# Patient Record
Sex: Male | Born: 1940 | Race: Black or African American | Hispanic: No | Marital: Married | Smoking: Never smoker
Health system: Southern US, Community
[De-identification: ages and names within clinical notes are randomized; demographics above are authoritative.]

## PROBLEM LIST (undated history)

## (undated) DIAGNOSIS — E119 Type 2 diabetes mellitus without complications: Secondary | ICD-10-CM

## (undated) DIAGNOSIS — R232 Flushing: Secondary | ICD-10-CM

## (undated) DIAGNOSIS — Z972 Presence of dental prosthetic device (complete) (partial): Secondary | ICD-10-CM

## (undated) DIAGNOSIS — H409 Unspecified glaucoma: Secondary | ICD-10-CM

## (undated) DIAGNOSIS — R351 Nocturia: Secondary | ICD-10-CM

## (undated) DIAGNOSIS — D869 Sarcoidosis, unspecified: Secondary | ICD-10-CM

## (undated) DIAGNOSIS — C61 Malignant neoplasm of prostate: Secondary | ICD-10-CM

## (undated) DIAGNOSIS — K5730 Diverticulosis of large intestine without perforation or abscess without bleeding: Secondary | ICD-10-CM

## (undated) DIAGNOSIS — E785 Hyperlipidemia, unspecified: Secondary | ICD-10-CM

## (undated) DIAGNOSIS — D649 Anemia, unspecified: Secondary | ICD-10-CM

## (undated) DIAGNOSIS — C642 Malignant neoplasm of left kidney, except renal pelvis: Secondary | ICD-10-CM

## (undated) DIAGNOSIS — Z86010 Personal history of colonic polyps: Secondary | ICD-10-CM

## (undated) DIAGNOSIS — Z8673 Personal history of transient ischemic attack (TIA), and cerebral infarction without residual deficits: Secondary | ICD-10-CM

## (undated) DIAGNOSIS — H269 Unspecified cataract: Secondary | ICD-10-CM

## (undated) DIAGNOSIS — R350 Frequency of micturition: Secondary | ICD-10-CM

## (undated) DIAGNOSIS — R011 Cardiac murmur, unspecified: Secondary | ICD-10-CM

## (undated) HISTORY — PX: MOLE REMOVAL: SHX2046

## (undated) HISTORY — DX: Sarcoidosis, unspecified: D86.9

## (undated) HISTORY — PX: PROSTATE SURGERY: SHX751

## (undated) HISTORY — DX: Unspecified cataract: H26.9

## (undated) HISTORY — PX: OTHER SURGICAL HISTORY: SHX169

---

## 2009-06-30 ENCOUNTER — Encounter (INDEPENDENT_AMBULATORY_CARE_PROVIDER_SITE_OTHER): Payer: Self-pay | Admitting: General Surgery

## 2009-06-30 ENCOUNTER — Emergency Department (HOSPITAL_COMMUNITY): Admission: EM | Admit: 2009-06-30 | Discharge: 2009-06-30 | Payer: Self-pay | Admitting: Emergency Medicine

## 2011-04-03 LAB — DIFFERENTIAL
Basophils Absolute: 0.2 10*3/uL — ABNORMAL HIGH (ref 0.0–0.1)
Basophils Relative: 2 % — ABNORMAL HIGH (ref 0–1)
Eosinophils Absolute: 0.2 10*3/uL (ref 0.0–0.7)
Eosinophils Relative: 3 % (ref 0–5)
Lymphs Abs: 2.7 10*3/uL (ref 0.7–4.0)
Neutrophils Relative %: 34 % — ABNORMAL LOW (ref 43–77)

## 2011-04-03 LAB — CBC
HCT: 42 % (ref 39.0–52.0)
MCV: 87.6 fL (ref 78.0–100.0)
Platelets: 196 10*3/uL (ref 150–400)
RDW: 14.8 % (ref 11.5–15.5)
WBC: 6.2 10*3/uL (ref 4.0–10.5)

## 2011-04-03 LAB — BASIC METABOLIC PANEL
BUN: 14 mg/dL (ref 6–23)
Chloride: 103 mEq/L (ref 96–112)
Creatinine, Ser: 0.98 mg/dL (ref 0.4–1.5)
GFR calc non Af Amer: >60 mL/min (ref >60)
Glucose, Bld: 222 mg/dL — ABNORMAL HIGH (ref 70–99)
Potassium: 3.9 mEq/L (ref 3.5–5.1)

## 2011-05-10 NOTE — Op Note (Signed)
NAMEKANAI, BERRIOS NO.:  [id number]   MEDICAL RECORD NO.:  [id number]          PATIENT TYPE:  EMS   LOCATION:  ED                            FACILITY:  [facility]   PHYSICIAN:  Barbaraann Barthel, M.D. DATE OF BIRTH:  Sep 17, 1941   DATE OF PROCEDURE:  DATE OF DISCHARGE:  06/30/2009                               OPERATIVE REPORT   NOTE:  Surgery was asked to see this middle-aged black male in my  office, who came in with a bleeding, a lesion on his back.  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  He states that he leaned back at work and caused this  to bleeding.  He came to the emergency room and from there the patient  was referred to my office.   TREATMENT RENDERED:  He was prepped and draped in the usual manner.  The  excision was carried out around this lesion and this was removed in  total and sent in formalin for pathology.  The wound was then closed  primarily with 2-0 nylon sutures.  Prior to closure, all sponge, needle  and instruments were correct.  I do not think we need any antibiotics on  this as there is no infection.  I will follow him up in my office for  suture removal in 2 weeks.      Barbaraann Barthel, M.D.  Electronically Signed     WB/MEDQ  D:  06/30/2009  T:  07/01/2009  Job:  [id number]   cc:   Shelda Jakes, MD  [address]  [place], [place] [address]

## 2013-04-09 ENCOUNTER — Telehealth: Payer: Self-pay

## 2013-04-09 NOTE — Telephone Encounter (Signed)
Pt called and wants to get colonoscopy. Never had one. He was referred by family and wants Dr. Jena Gauss to do it. Scheduled OV with Tana Coast PA at 9:00 Am on 04/29/2013 because he has seen a little blood in stool.

## 2013-04-29 ENCOUNTER — Ambulatory Visit (INDEPENDENT_AMBULATORY_CARE_PROVIDER_SITE_OTHER): Payer: Managed Care, Other (non HMO) | Admitting: Gastroenterology

## 2013-04-29 ENCOUNTER — Encounter: Payer: Self-pay | Admitting: Gastroenterology

## 2013-04-29 ENCOUNTER — Other Ambulatory Visit: Payer: Self-pay | Admitting: Internal Medicine

## 2013-04-29 VITALS — BP 160/88 | HR 77 | Temp 98.6°F | Ht 67.0 in | Wt 205.6 lb

## 2013-04-29 DIAGNOSIS — K625 Hemorrhage of anus and rectum: Secondary | ICD-10-CM

## 2013-04-29 MED ORDER — PEG 3350-KCL-NA BICARB-NACL 420 G PO SOLR: 4000.0000 mL | ORAL | Status: DC

## 2013-04-29 NOTE — Assessment & Plan Note (Signed)
72 year old gentleman with history of recent episode of bright red blood per thumb. Possibly benign anorectal source. Other than vague right-sided abdominal pain related to movement, he has been without any GI symptoms. Abdominal exam is benign. Suspect musculoskeletal component for the right-sided abdominal pain. Proceed with colonoscopy in the near future.  I have discussed the risks, alternatives, benefits with regards to but not limited to the risk of reaction to medication, bleeding, infection, perforation and the patient is agreeable to proceed. Written consent to be obtained.

## 2013-04-29 NOTE — Patient Instructions (Signed)
1. Colonoscopy with Dr. Jena Gauss. Please see separate instructions. 2. Encourage you to establish care with a general practitioner. Please followup with your appointment with Dr. Juanetta Gosling as scheduled.

## 2013-04-29 NOTE — Progress Notes (Signed)
Primary Care Physician:  Fredirick Maudlin, MD  Primary Gastroenterologist:  Roetta Sessions, MD   Chief Complaint  Patient presents with  . Colonoscopy    HPI:  Jon Brooks is a 72 y.o. male here to schedule his first ever colonoscopy. Single episode of brbpr recently. No constipation, diarrhea. Mild RLQ pain started couple of days ago. Aggravated with movement. Not affected by meals or BMs. Goes away without doing anything. No heartburn. No dysphagia. No weight loss. No family history of colon cancer. Daughter died of liver disease age 12, possibly Jon Brooks.  Current Outpatient Prescriptions  Medication Sig Dispense Refill  . AZOPT 1 % ophthalmic suspension Place 1 drop into both eyes 2 (two) times daily.       . TRAVATAN Z 0.004 % SOLN ophthalmic solution Place 1 drop into both eyes at bedtime.        No current facility-administered medications for this visit.    Allergies as of 04/29/2013  . (No Known Allergies)    Past Medical History  Diagnosis Date  . Glaucoma     Past Surgical History  Procedure Laterality Date  . Cyst on left shoulder      Family History  Problem Relation Age of Onset  . Colon cancer Neg Hx   . Liver disease Father     NASH? age 47    History   Social History  . Marital Status: Married    Spouse Name: N/A    Number of Children: 1  . Years of Education: N/A   Occupational History  .  [employer]   Social History Main Topics  . Smoking status: Never Smoker   . Smokeless tobacco: Not on file  . Alcohol Use: No  . Drug Use: No  . Sexually Active: Not on file   Other Topics Concern  . Not on file   Social History Narrative   Daughter deceased age 20, liver disease ?NASH 2010      ROS:  General: Negative for anorexia, weight loss, fever, chills, fatigue, weakness. Eyes: Negative for vision changes.  ENT: Negative for hoarseness, difficulty swallowing , nasal congestion. CV: Negative for chest pain, angina, palpitations,  dyspnea on exertion, peripheral edema.  Respiratory: Negative for dyspnea at rest, dyspnea on exertion, cough, sputum, wheezing.  GI: See history of present illness. GU:  Negative for dysuria, hematuria, urinary incontinence, urinary frequency, nocturnal urination.  MS: Negative for joint pain, low back pain.  Derm: Negative for rash or itching.  Neuro: Negative for weakness, abnormal sensation, seizure, frequent headaches, memory loss, confusion.  Psych: Negative for anxiety, depression, suicidal ideation, hallucinations.  Endo: Negative for unusual weight change.  Heme: Negative for bruising or bleeding. Allergy: Negative for rash or hives.    Physical Examination:  BP 160/88  Pulse 77  Temp(Src) 98.6 F (37 C) (Oral)  Ht 5\' 7"  (1.702 m)  Wt 205 lb 9.6 oz (93.26 kg)  BMI 32.19 kg/m2   General: Well-nourished, well-developed in no acute distress.  Head: Normocephalic, atraumatic.   Eyes: Conjunctiva pink, no icterus. Mouth: Oropharyngeal mucosa moist and pink , no lesions erythema or exudate. Neck: Supple without thyromegaly, masses, or lymphadenopathy.  Lungs: Clear to auscultation bilaterally.  Heart: Regular rate and rhythm, no murmurs rubs or gallops.  Abdomen: Bowel sounds are normal, nontender, nondistended, no hepatosplenomegaly or masses, no abdominal bruits or    hernia , no rebound or guarding.   Rectal: Deferred Extremities: No lower extremity edema. No clubbing or deformities.  Neuro: Alert and oriented x 4 , grossly normal neurologically.  Skin: Warm and dry, no rash or jaundice.   Psych: Alert and cooperative, normal mood and affect.

## 2013-04-29 NOTE — Progress Notes (Signed)
CC PCP 

## 2013-04-30 ENCOUNTER — Encounter (HOSPITAL_COMMUNITY): Payer: Self-pay | Admitting: Pharmacy Technician

## 2013-05-15 ENCOUNTER — Telehealth: Payer: Self-pay

## 2013-05-15 NOTE — Telephone Encounter (Signed)
Pt called and was concerned that he was supposed to take the Ducolax tablets and Bisacodyl tablets and I told him it was the same. Told him to take 2 tablets at noon and 2 one hour after completing his solution. He is also aware that he is to take the enema before he goes to the hospital.

## 2013-05-16 ENCOUNTER — Ambulatory Visit (HOSPITAL_COMMUNITY)
Admission: RE | Admit: 2013-05-16 | Discharge: 2013-05-16 | Disposition: A | Discharge status: Home / Self Care | Payer: Managed Care, Other (non HMO) | Source: Ambulatory Visit | Attending: Internal Medicine | Admitting: Internal Medicine

## 2013-05-16 ENCOUNTER — Encounter (HOSPITAL_COMMUNITY)
Admission: RE | Disposition: A | Discharge status: Home / Self Care | Payer: Self-pay | Source: Ambulatory Visit | Attending: Internal Medicine

## 2013-05-16 ENCOUNTER — Encounter (HOSPITAL_COMMUNITY): Payer: Self-pay

## 2013-05-16 DIAGNOSIS — D126 Benign neoplasm of colon, unspecified: Secondary | ICD-10-CM | POA: Insufficient documentation

## 2013-05-16 DIAGNOSIS — H409 Unspecified glaucoma: Secondary | ICD-10-CM | POA: Insufficient documentation

## 2013-05-16 DIAGNOSIS — Z86010 Personal history of colonic polyps: Secondary | ICD-10-CM

## 2013-05-16 DIAGNOSIS — Q438 Other specified congenital malformations of intestine: Secondary | ICD-10-CM | POA: Insufficient documentation

## 2013-05-16 DIAGNOSIS — R1031 Right lower quadrant pain: Secondary | ICD-10-CM | POA: Insufficient documentation

## 2013-05-16 DIAGNOSIS — Z79899 Other long term (current) drug therapy: Secondary | ICD-10-CM | POA: Insufficient documentation

## 2013-05-16 DIAGNOSIS — Z860101 Personal history of adenomatous and serrated colon polyps: Secondary | ICD-10-CM

## 2013-05-16 DIAGNOSIS — K5730 Diverticulosis of large intestine without perforation or abscess without bleeding: Secondary | ICD-10-CM | POA: Insufficient documentation

## 2013-05-16 DIAGNOSIS — K625 Hemorrhage of anus and rectum: Secondary | ICD-10-CM | POA: Insufficient documentation

## 2013-05-16 HISTORY — PX: COLONOSCOPY: SHX5424

## 2013-05-16 HISTORY — DX: Personal history of adenomatous and serrated colon polyps: Z86.0101

## 2013-05-16 HISTORY — DX: Personal history of colonic polyps: Z86.010

## 2013-05-16 SURGERY — COLONOSCOPY
Anesthesia: Moderate Sedation

## 2013-05-16 MED ORDER — MIDAZOLAM HCL 5 MG/5ML IJ SOLN
INTRAMUSCULAR | Status: AC
  Filled 2013-05-16: qty 10

## 2013-05-16 MED ORDER — MEPERIDINE HCL 100 MG/ML IJ SOLN
INTRAMUSCULAR | Status: DC | PRN
  Administered 2013-05-16: 25 mg via INTRAVENOUS
  Administered 2013-05-16: 50 mg via INTRAVENOUS
  Administered 2013-05-16: 25 mg via INTRAVENOUS

## 2013-05-16 MED ORDER — SODIUM CHLORIDE 0.9 % IV SOLN
INTRAVENOUS | Status: DC
  Administered 2013-05-16: 20 mL/h via INTRAVENOUS

## 2013-05-16 MED ORDER — STERILE WATER FOR IRRIGATION IR SOLN
Status: DC | PRN
  Administered 2013-05-16: 12:00:00

## 2013-05-16 MED ORDER — ONDANSETRON HCL 4 MG/2ML IJ SOLN
INTRAMUSCULAR | Status: DC | PRN
  Administered 2013-05-16: 4 mg via INTRAVENOUS

## 2013-05-16 MED ORDER — ONDANSETRON HCL 4 MG/2ML IJ SOLN
INTRAMUSCULAR | Status: AC
  Filled 2013-05-16: qty 2

## 2013-05-16 MED ORDER — MEPERIDINE HCL 100 MG/ML IJ SOLN
INTRAMUSCULAR | Status: AC
  Filled 2013-05-16: qty 1

## 2013-05-16 MED ORDER — MIDAZOLAM HCL 5 MG/5ML IJ SOLN
INTRAMUSCULAR | Status: DC | PRN
  Administered 2013-05-16 (×2): 1 mg via INTRAVENOUS
  Administered 2013-05-16: 2 mg via INTRAVENOUS
  Administered 2013-05-16 (×2): 1 mg via INTRAVENOUS

## 2013-05-16 NOTE — Op Note (Signed)
[facility] [address] [place], [address]   COLONOSCOPY PROCEDURE REPORT  PATIENT: Jon Brooks, Jon Brooks  MR#:         [id number] BIRTHDATE: [birth year] , 72  yrs. old GENDER: Male ENDOSCOPIST: R.  Roetta Sessions, MD FACP FACG REFERRED BY:  Kari Baars, M.D. PROCEDURE DATE:  05/16/2013 PROCEDURE:     Colonoscopy with snare polypectomy  INDICATIONS: single episode of hematochezia; no prior colonoscopy  INFORMED CONSENT:  The risks, benefits, alternatives and imponderables including but not limited to bleeding, perforation as well as the possibility of a missed lesion have been reviewed.  The potential for biopsy, lesion removal, etc. have also been discussed.  Questions have been answered.  All parties agreeable. Please see the history and physical in the medical record for more information.  MEDICATIONS: Versed 6 mg IV and Demerol 100 mg IV in divided doses. Zofran 4 mg IV  DESCRIPTION OF PROCEDURE:  After a digital rectal exam was performed, the EC-[id number] ([id number])  colonoscope was advanced from the anus through the rectum and colon to the area of the cecum, ileocecal valve and appendiceal orifice.  The cecum was deeply intubated.  These structures were well-seen and photographed for the record.  From the level of the cecum and ileocecal valve, the scope was slowly and cautiously withdrawn.  The mucosal surfaces were carefully surveyed utilizing scope tip deflection to facilitate fold flattening as needed.  The scope was pulled down into the rectum where a thorough examination including retroflexion was performed.    FINDINGS:  Adequate preparation. Normal rectum. Redundant colon. Left-sided diverticula. (1) 3 mm polyp opposite the ileocecal valve; the remainder of the colonic mucosa appeared normal.  THERAPEUTIC / DIAGNOSTIC MANEUVERS PERFORMED:  The above-mentioned polyps was cold snare removed  COMPLICATIONS: none  CECAL WITHDRAWAL TIME:  6  minutes  IMPRESSION:  colonic diverticulosis. Colonic polyp-removed as described above  RECOMMENDATIONS: Followup on pathology   _______________________________ eSigned:  R. Roetta Sessions, MD FACP [facility] 05/16/2013 1:12 PM   CC:

## 2013-05-16 NOTE — H&P (View-Only) (Signed)
Primary Care Physician:  HAWKINS,EDWARD L, MD  Primary Gastroenterologist:  Michael Rourk, MD   Chief Complaint  Patient presents with  . Colonoscopy    HPI:  Jon Brooks is a 72 y.o. male here to schedule his first ever colonoscopy. Single episode of brbpr recently. No constipation, diarrhea. Mild RLQ pain started couple of days ago. Aggravated with movement. Not affected by meals or BMs. Goes away without doing anything. No heartburn. No dysphagia. No weight loss. No family history of colon cancer. Daughter died of liver disease age 29, possibly [place].  Current Outpatient Prescriptions  Medication Sig Dispense Refill  . AZOPT 1 % ophthalmic suspension Place 1 drop into both eyes 2 (two) times daily.       . TRAVATAN Z 0.004 % SOLN ophthalmic solution Place 1 drop into both eyes at bedtime.        No current facility-administered medications for this visit.    Allergies as of 04/29/2013  . (No Known Allergies)    Past Medical History  Diagnosis Date  . Glaucoma     Past Surgical History  Procedure Laterality Date  . Cyst on left shoulder      Family History  Problem Relation Age of Onset  . Colon cancer Neg Hx   . Liver disease Father     NASH? age 29    History   Social History  . Marital Status: Married    Spouse Name: N/A    Number of Children: 1  . Years of Education: N/A   Occupational History  .  [employer]   Social History Main Topics  . Smoking status: Never Smoker   . Smokeless tobacco: Not on file  . Alcohol Use: No  . Drug Use: No  . Sexually Active: Not on file   Other Topics Concern  . Not on file   Social History Narrative   Daughter deceased age 29, liver disease ?NASH 2010      ROS:  General: Negative for anorexia, weight loss, fever, chills, fatigue, weakness. Eyes: Negative for vision changes.  ENT: Negative for hoarseness, difficulty swallowing , nasal congestion. CV: Negative for chest pain, angina, palpitations,  dyspnea on exertion, peripheral edema.  Respiratory: Negative for dyspnea at rest, dyspnea on exertion, cough, sputum, wheezing.  GI: See history of present illness. GU:  Negative for dysuria, hematuria, urinary incontinence, urinary frequency, nocturnal urination.  MS: Negative for joint pain, low back pain.  Derm: Negative for rash or itching.  Neuro: Negative for weakness, abnormal sensation, seizure, frequent headaches, memory loss, confusion.  Psych: Negative for anxiety, depression, suicidal ideation, hallucinations.  Endo: Negative for unusual weight change.  Heme: Negative for bruising or bleeding. Allergy: Negative for rash or hives.    Physical Examination:  BP 160/88  Pulse 77  Temp(Src) 98.6 F (37 C) (Oral)  Ht 5' 7" (1.702 m)  Wt 205 lb 9.6 oz (93.26 kg)  BMI 32.19 kg/m2   General: Well-nourished, well-developed in no acute distress.  Head: Normocephalic, atraumatic.   Eyes: Conjunctiva pink, no icterus. Mouth: Oropharyngeal mucosa moist and pink , no lesions erythema or exudate. Neck: Supple without thyromegaly, masses, or lymphadenopathy.  Lungs: Clear to auscultation bilaterally.  Heart: Regular rate and rhythm, no murmurs rubs or gallops.  Abdomen: Bowel sounds are normal, nontender, nondistended, no hepatosplenomegaly or masses, no abdominal bruits or    hernia , no rebound or guarding.   Rectal: Deferred Extremities: No lower extremity edema. No clubbing or deformities.    Neuro: Alert and oriented x 4 , grossly normal neurologically.  Skin: Warm and dry, no rash or jaundice.   Psych: Alert and cooperative, normal mood and affect.     

## 2013-05-16 NOTE — Interval H&P Note (Signed)
History and Physical Interval Note:  05/16/2013 12:07 PM  Jon Brooks  has presented today for surgery, with the diagnosis of RECTAL BLEEDING  The various methods of treatment have been discussed with the patient and family. After consideration of risks, benefits and other options for treatment, the patient has consented to  Procedure(s) with comments: COLONOSCOPY (N/A) - 9:30 as a surgical intervention .  The patient's history has been reviewed, patient examined, no change in status, stable for surgery.  I have reviewed the patient's chart and labs.  Questions were answered to the patient's satisfaction.     Colonoscopy per plan. First-ever examination. Recent hematochezia.The risks, benefits, limitations, alternatives and imponderables have been reviewed with the patient. Questions have been answered. All parties are agreeable.   Jon Brooks

## 2013-05-21 ENCOUNTER — Encounter (HOSPITAL_COMMUNITY): Payer: Self-pay | Admitting: Internal Medicine

## 2013-05-21 ENCOUNTER — Encounter: Payer: Self-pay | Admitting: Internal Medicine

## 2013-10-19 NOTE — Progress Notes (Signed)
REVIEWED.  

## 2015-06-19 ENCOUNTER — Encounter (HOSPITAL_COMMUNITY): Payer: Self-pay | Admitting: Emergency Medicine

## 2015-06-19 ENCOUNTER — Observation Stay (HOSPITAL_COMMUNITY)
Admission: EM | Admit: 2015-06-19 | Discharge: 2015-06-21 | Disposition: A | Discharge status: Home / Self Care | Payer: Managed Care, Other (non HMO) | Attending: Pulmonary Disease | Admitting: Pulmonary Disease

## 2015-06-19 ENCOUNTER — Emergency Department (HOSPITAL_COMMUNITY): Payer: Managed Care, Other (non HMO)

## 2015-06-19 DIAGNOSIS — R4182 Altered mental status, unspecified: Secondary | ICD-10-CM | POA: Diagnosis not present

## 2015-06-19 DIAGNOSIS — Z79899 Other long term (current) drug therapy: Secondary | ICD-10-CM | POA: Diagnosis not present

## 2015-06-19 DIAGNOSIS — IMO0002 Reserved for concepts with insufficient information to code with codable children: Secondary | ICD-10-CM | POA: Diagnosis present

## 2015-06-19 DIAGNOSIS — I639 Cerebral infarction, unspecified: Principal | ICD-10-CM | POA: Insufficient documentation

## 2015-06-19 DIAGNOSIS — R29810 Facial weakness: Secondary | ICD-10-CM | POA: Diagnosis present

## 2015-06-19 DIAGNOSIS — E1165 Type 2 diabetes mellitus with hyperglycemia: Secondary | ICD-10-CM | POA: Diagnosis present

## 2015-06-19 DIAGNOSIS — Z8673 Personal history of transient ischemic attack (TIA), and cerebral infarction without residual deficits: Secondary | ICD-10-CM

## 2015-06-19 DIAGNOSIS — G458 Other transient cerebral ischemic attacks and related syndromes: Secondary | ICD-10-CM

## 2015-06-19 DIAGNOSIS — R739 Hyperglycemia, unspecified: Secondary | ICD-10-CM

## 2015-06-19 DIAGNOSIS — H409 Unspecified glaucoma: Secondary | ICD-10-CM | POA: Diagnosis not present

## 2015-06-19 DIAGNOSIS — G459 Transient cerebral ischemic attack, unspecified: Secondary | ICD-10-CM | POA: Diagnosis present

## 2015-06-19 DIAGNOSIS — J0100 Acute maxillary sinusitis, unspecified: Secondary | ICD-10-CM

## 2015-06-19 DIAGNOSIS — E1169 Type 2 diabetes mellitus with other specified complication: Secondary | ICD-10-CM | POA: Diagnosis present

## 2015-06-19 DIAGNOSIS — I10 Essential (primary) hypertension: Secondary | ICD-10-CM | POA: Diagnosis present

## 2015-06-19 DIAGNOSIS — J019 Acute sinusitis, unspecified: Secondary | ICD-10-CM | POA: Diagnosis present

## 2015-06-19 DIAGNOSIS — E785 Hyperlipidemia, unspecified: Secondary | ICD-10-CM

## 2015-06-19 HISTORY — DX: Personal history of transient ischemic attack (TIA), and cerebral infarction without residual deficits: Z86.73

## 2015-06-19 LAB — CBC WITH DIFFERENTIAL/PLATELET
Basophils Absolute: 0 10*3/uL (ref 0.0–0.1)
Basophils Relative: 0 % (ref 0–1)
Eosinophils Absolute: 0.2 10*3/uL (ref 0.0–0.7)
Eosinophils Relative: 3 % (ref 0–5)
HCT: 44 % (ref 39.0–52.0)
Hemoglobin: 14.6 g/dL (ref 13.0–17.0)
LYMPHS PCT: 35 % (ref 12–46)
Lymphs Abs: 2 10*3/uL (ref 0.7–4.0)
MCH: 28.6 pg (ref 26.0–34.0)
MCHC: 33.2 g/dL (ref 30.0–36.0)
MCV: 86.1 fL (ref 78.0–100.0)
Monocytes Absolute: 0.8 10*3/uL (ref 0.1–1.0)
Monocytes Relative: 14 % — ABNORMAL HIGH (ref 3–12)
NEUTROS ABS: 2.7 10*3/uL (ref 1.7–7.7)
NEUTROS PCT: 48 % (ref 43–77)
PLATELETS: 218 10*3/uL (ref 150–400)
RBC: 5.11 MIL/uL (ref 4.22–5.81)
RDW: 14.5 % (ref 11.5–15.5)
WBC: 5.7 10*3/uL (ref 4.0–10.5)

## 2015-06-19 LAB — COMPREHENSIVE METABOLIC PANEL
ALT: 20 U/L (ref 17–63)
AST: 29 U/L (ref 15–41)
Albumin: 3.5 g/dL (ref 3.5–5.0)
Alkaline Phosphatase: 77 U/L (ref 38–126)
Anion gap: 10 (ref 5–15)
BUN: 17 mg/dL (ref 6–20)
CALCIUM: 9 mg/dL (ref 8.9–10.3)
CO2: 25 mmol/L (ref 22–32)
CREATININE: 1.04 mg/dL (ref 0.61–1.24)
Chloride: 103 mmol/L (ref 101–111)
GFR calc Af Amer: >60 mL/min (ref >60)
GFR calc non Af Amer: >60 mL/min (ref >60)
GLUCOSE: 257 mg/dL — AB (ref 65–99)
Potassium: 4.1 mmol/L (ref 3.5–5.1)
Sodium: 138 mmol/L (ref 135–145)
Total Bilirubin: 0.9 mg/dL (ref 0.3–1.2)
Total Protein: 8.2 g/dL — ABNORMAL HIGH (ref 6.5–8.1)

## 2015-06-19 LAB — URINALYSIS, ROUTINE W REFLEX MICROSCOPIC
BILIRUBIN URINE: NEGATIVE
Glucose, UA: 100 mg/dL — AB
Ketones, ur: NEGATIVE mg/dL
NITRITE: POSITIVE — AB
PH: 5.5 (ref 5.0–8.0)
Protein, ur: NEGATIVE mg/dL
Specific Gravity, Urine: 1.025 (ref 1.005–1.030)
Urobilinogen, UA: 0.2 mg/dL (ref 0.0–1.0)

## 2015-06-19 LAB — I-STAT CHEM 8, ED
BUN: 17 mg/dL (ref 6–20)
CALCIUM ION: 1.18 mmol/L (ref 1.13–1.30)
CREATININE: 1 mg/dL (ref 0.61–1.24)
Chloride: 103 mmol/L (ref 101–111)
GLUCOSE: 258 mg/dL — AB (ref 65–99)
HCT: 49 % (ref 39.0–52.0)
Hemoglobin: 16.7 g/dL (ref 13.0–17.0)
POTASSIUM: 4.3 mmol/L (ref 3.5–5.1)
Sodium: 139 mmol/L (ref 135–145)
TCO2: 23 mmol/L (ref 0–100)

## 2015-06-19 LAB — GLUCOSE, CAPILLARY: Glucose-Capillary: 231 mg/dL — ABNORMAL HIGH (ref 65–99)

## 2015-06-19 LAB — RAPID URINE DRUG SCREEN, HOSP PERFORMED
Amphetamines: NOT DETECTED
Barbiturates: NOT DETECTED
Benzodiazepines: NOT DETECTED
COCAINE: NOT DETECTED
OPIATES: NOT DETECTED
TETRAHYDROCANNABINOL: NOT DETECTED

## 2015-06-19 LAB — TROPONIN I

## 2015-06-19 LAB — URINE MICROSCOPIC-ADD ON

## 2015-06-19 LAB — I-STAT TROPONIN, ED: Troponin i, poc: 0 ng/mL (ref 0.00–0.08)

## 2015-06-19 LAB — APTT: APTT: 34 s (ref 24–37)

## 2015-06-19 LAB — ETHANOL: ALCOHOL ETHYL (B): 6 mg/dL — AB (ref <5)

## 2015-06-19 MED ORDER — STROKE: EARLY STAGES OF RECOVERY BOOK
Freq: Once | Status: DC
  Filled 2015-06-19: qty 1

## 2015-06-19 MED ORDER — ASPIRIN 325 MG PO TABS
325.0000 mg | ORAL_TABLET | Freq: Every day | ORAL | Status: DC
  Administered 2015-06-20 – 2015-06-21 (×2): 325 mg via ORAL
  Filled 2015-06-19 (×2): qty 1

## 2015-06-19 MED ORDER — CEFUROXIME AXETIL 250 MG PO TABS
500.0000 mg | ORAL_TABLET | Freq: Two times a day (BID) | ORAL | Status: DC
  Administered 2015-06-19 – 2015-06-21 (×4): 500 mg via ORAL
  Filled 2015-06-19 (×4): qty 2

## 2015-06-19 MED ORDER — ENOXAPARIN SODIUM 40 MG/0.4ML ~~LOC~~ SOLN
40.0000 mg | SUBCUTANEOUS | Status: DC
Start: 2015-06-19 — End: 2015-06-21
  Administered 2015-06-19 – 2015-06-20 (×2): 40 mg via SUBCUTANEOUS
  Filled 2015-06-19 (×2): qty 0.4

## 2015-06-19 MED ORDER — LATANOPROST 0.005 % OP SOLN
1.0000 [drp] | Freq: Every day | OPHTHALMIC | Status: DC
  Administered 2015-06-19 – 2015-06-20 (×2): 1 [drp] via OPHTHALMIC
  Filled 2015-06-19: qty 2.5

## 2015-06-19 MED ORDER — INSULIN ASPART 100 UNIT/ML ~~LOC~~ SOLN
0.0000 [IU] | Freq: Three times a day (TID) | SUBCUTANEOUS | Status: DC
  Administered 2015-06-20 (×2): 2 [IU] via SUBCUTANEOUS
  Administered 2015-06-20: 3 [IU] via SUBCUTANEOUS
  Administered 2015-06-21: 2 [IU] via SUBCUTANEOUS

## 2015-06-19 MED ORDER — BRINZOLAMIDE 1 % OP SUSP
1.0000 [drp] | Freq: Two times a day (BID) | OPHTHALMIC | Status: DC
  Administered 2015-06-20 – 2015-06-21 (×3): 1 [drp] via OPHTHALMIC
  Filled 2015-06-19: qty 10

## 2015-06-19 MED ORDER — INSULIN ASPART 100 UNIT/ML ~~LOC~~ SOLN
0.0000 [IU] | Freq: Every day | SUBCUTANEOUS | Status: DC
  Administered 2015-06-19: 2 [IU] via SUBCUTANEOUS

## 2015-06-19 MED ORDER — LATANOPROST 0.005 % OP SOLN
OPHTHALMIC | Status: AC
  Filled 2015-06-19: qty 2.5

## 2015-06-19 NOTE — ED Notes (Signed)
Report given to [facility], Dept 300, all questions answered.

## 2015-06-19 NOTE — H&P (Signed)
[facility] History and Physical  Liron Eissler KLK:[id number] DOB: 11-Mar-1941    PCP:   Alonza Bogus, MD   Chief Complaint: slurred speech and transient confusion.  HPI: Jon Brooks is an 74 y.o. male with benign PMH, having glaucoma on eye drops, presented to the ER as a code stroke.  He was having dinner with his wife, and "fell asleep" with food in his mouth.  She thought his face was droopy as well.  She was trying to talk to him, but he was having confusions.  There was no slurred speech.  He said he often felt sleepy after eating, but never actually felt asleep.  Code stroke was called, and by the time he returned to his room after head CT, all his symptoms resolved.  He was not a candidate for TPA due to resolution of his mild symptoms.  Further work up included an EKG with NSR, and BS of 257, Cr 1.04, and K of 4.1.  His WBC is normal, and his Hb was 14. 6.  Head CT showed right maxillary sinusitis but no CVA.  Hospitalist was asked to admit him for TIA and hyperglycemia.   Rewiew of Systems:  Constitutional: Negative for malaise, fever and chills. No significant weight loss or weight gain Eyes: Negative for eye pain, redness and discharge, diplopia, visual changes, or flashes of light. ENMT: Negative for ear pain, hoarseness, nasal congestion, sinus pressure and sore throat. No headaches; tinnitus, drooling, or problem swallowing. Cardiovascular: Negative for chest pain, palpitations, diaphoresis, dyspnea and peripheral edema. ; No orthopnea, PND Respiratory: Negative for cough, hemoptysis, wheezing and stridor. No pleuritic chestpain. Gastrointestinal: Negative for nausea, vomiting, diarrhea, constipation, abdominal pain, melena, blood in stool, hematemesis, jaundice and rectal bleeding.    Genitourinary: Negative for frequency, dysuria, incontinence,flank pain and hematuria; Musculoskeletal: Negative for back pain and neck pain. Negative for swelling and trauma.;  Skin: .  Negative for pruritus, rash, abrasions, bruising and skin lesion.; ulcerations Neuro: Negative for headache, lightheadedness and neck stiffness. Negative for weakness, altered level of consciousness , altered mental status, extremity weakness, burning feet, involuntary movement, seizure and syncope.  Psych: negative for anxiety, depression, insomnia, tearfulness, panic attacks, hallucinations, paranoia, suicidal or homicidal ideation    Past Medical History  Diagnosis Date  . Glaucoma     Past Surgical History  Procedure Laterality Date  . Cyst on left shoulder    . Colonoscopy N/A 05/16/2013    Procedure: COLONOSCOPY;  Surgeon: Daneil Dolin, MD;  Location: [facility];  Service: Endoscopy;  Laterality: N/A;  9:30    Medications:  HOME MEDS: Prior to Admission medications   Medication Sig Start Date End Date Taking? Authorizing Provider  AZOPT 1 % ophthalmic suspension Place 1 drop into both eyes 2 (two) times daily.  03/15/13  Yes Historical Provider, MD  TRAVATAN Z 0.004 % SOLN ophthalmic solution Place 1 drop into both eyes at bedtime.  04/17/13  Yes Historical Provider, MD  polyethylene glycol-electrolytes (TRILYTE) 420 G solution Take 4,000 mLs by mouth as directed. Patient not taking: Reported on 06/19/2015 04/29/13   Daneil Dolin, MD     Allergies:  No Known Allergies  Social History:   reports that he has never smoked. He does not have any smokeless tobacco history on file. He reports that he does not drink alcohol or use illicit drugs.  Family History: Family History  Problem Relation Age of Onset  . Colon cancer Neg Hx   . Liver disease  Father     NASH? age 21     Physical Exam: Filed Vitals:   06/19/15 1737 06/19/15 1802 06/19/15 1813  BP: 164/89  152/92  Pulse:   69  Temp:  98.3 F (36.8 C)   Resp: 18  17  SpO2:   100%   Blood pressure 152/92, pulse 69, temperature 98.3 F (36.8 C), resp. rate 17, SpO2 100 %.  GEN:  Pleasant patient lying in the  stretcher in no acute distress; cooperative with exam. PSYCH:  alert and oriented x4; does not appear anxious or depressed; affect is appropriate. HEENT: Mucous membranes pink and anicteric; PERRLA; EOM intact; no cervical lymphadenopathy nor thyromegaly or carotid bruit; no JVD; There were no stridor. Neck is very supple. Breasts:: Not examined CHEST WALL: No tenderness CHEST: Normal respiration, clear to auscultation bilaterally.  HEART: Regular rate and rhythm.  There are no murmur, rub, or gallops.   BACK: No kyphosis or scoliosis; no CVA tenderness ABDOMEN: soft and non-tender; no masses, no organomegaly, normal abdominal bowel sounds; no pannus; no intertriginous candida. There is no rebound and no distention. Rectal Exam: Not done EXTREMITIES: No bone or joint deformity; age-appropriate arthropathy of the hands and knees; no edema; no ulcerations.  There is no calf tenderness. Genitalia: not examined PULSES: 2+ and symmetric SKIN: Normal hydration no rash or ulceration CNS: Cranial nerves 2-12 grossly intact no focal lateralizing neurologic deficit.  Speech is fluent; uvula elevated with phonation, facial symmetry and tongue midline. DTR are normal bilaterally, cerebella exam is intact, barbinski is negative and strengths are equaled bilaterally.  No sensory loss.   Labs on Admission:  Basic Metabolic Panel:  Recent Labs Lab 06/19/15 1734 06/19/15 1739  NA 138 139  K 4.1 4.3  CL 103 103  CO2 25  --   GLUCOSE 257* 258*  BUN 17 17  CREATININE 1.04 1.00  CALCIUM 9.0  --    Liver Function Tests:  Recent Labs Lab 06/19/15 1734  AST 29  ALT 20  ALKPHOS 77  BILITOT 0.9  PROT 8.2*  ALBUMIN 3.5   No results for input(s): LIPASE, AMYLASE in the last 168 hours. No results for input(s): AMMONIA in the last 168 hours. CBC:  Recent Labs Lab 06/19/15 1734 06/19/15 1739  WBC 5.7  --   NEUTROABS 2.7  --   HGB 14.6 16.7  HCT 44.0 49.0  MCV 86.1  --   PLT 218  --     Cardiac Enzymes:  Recent Labs Lab 06/19/15 1734  TROPONINI <0.03    CBG: No results for input(s): GLUCAP in the last 168 hours.   Radiological Exams on Admission: Ct Head Wo Contrast  06/19/2015   CLINICAL DATA:  Left-sided weakness and slurred speech starting 1 hour ago.  EXAM: CT HEAD WITHOUT CONTRAST  TECHNIQUE: Contiguous axial images were obtained from the base of the skull through the vertex without intravenous contrast.  COMPARISON:  None.  FINDINGS: The brainstem, cerebellum, cerebral peduncles, thalamus, basal ganglia, basilar cisterns, and ventricular system appear within normal limits. No intracranial hemorrhage, mass lesion, or acute CVA.  There is newly complete opacification of the visualized portion of the right maxillary sinus with opacification of multiple right-sided ethmoid air cells and complete opacification of the right frontal sinus.  IMPRESSION: 1. Complete opacification of the right frontal sinus with opacification of multiple right-sided ethmoid air cells and subtotal opacification the right maxillary sinus. This may reflect acute or chronic sinusitis. No obvious cortical breakthrough is  identified. 2. No acute CVA or other acute intracranial findings are observed. These results were called by telephone at the time of interpretation on 06/19/2015 at 5:43 pm to Dr. Milton Ferguson , who verbally acknowledged these results.   Electronically Signed   By: Van Clines M.D.   On: 06/19/2015 17:43    EKG: Independently reviewed.    Assessment/Plan Present on Admission:  . Brain TIA Hyperglycemia. Sinusitis.   PLAN:  Will admit him for TIA.  Will start ASA after swallowing test.  Work up will include MRI/MRA brain, ECHO, and carotids US.  For his hyperglycemia, will use SSI, and consider metformin upon discharge from the hospital.  For his sinusitis, will start him on Ceftin BID for 10 days.  He is stable, full code, and will be admitted to [facility].  He would like to  have Dr Luan Pulling as his PCP, but had never seen him.  Would have him follow up with Dr Luan Pulling upon discharge.  Thanks.   Other plans as per orders.  Code Status: FULL Haskel Khan, MD. [facility] Pager [phone] 7pm to 7am.  06/19/2015, 8:42 PM

## 2015-06-19 NOTE — ED Notes (Signed)
Per EMS pt eating dinner with family and had syncopal episode, left sided weakness and left side facial droop. EMS reports 2 additional syncopal episodes in route. EDP at [place] to assess pt. Pt straight to CT scan.

## 2015-06-19 NOTE — ED Notes (Signed)
CT read negative per teleneurologist. Patient states he feels as if he is back to his normal.

## 2015-06-19 NOTE — Progress Notes (Signed)
Randell Loop called @ 17:14 Beeper @ 17:16 [facility] Radiology @ 17:32 -  Had to wait for someone to answer.  Images to [facility] immediately after scanning - 17:25 /bbj for [employer]

## 2015-06-19 NOTE — ED Provider Notes (Signed)
CSN: [id number]     Arrival date & time 06/19/15  1723 History   First MD Initiated Contact with Patient 06/19/15 1724     Chief Complaint  Patient presents with  . Code Stroke     (Consider location/radiation/quality/duration/timing/severity/associated sxs/prior Treatment) Patient is a 74 y.o. male presenting with weakness. The history is provided by the spouse (pt was eating and had some left facial droop and confusion.  ems stated he had left side weakness.  started at 4pm).  Weakness This is a new problem. The current episode started 1 to 2 hours ago. The problem occurs constantly. The problem has not changed since onset.Pertinent negatives include no chest pain and no abdominal pain. Nothing aggravates the symptoms. Nothing relieves the symptoms.    Past Medical History  Diagnosis Date  . Glaucoma    Past Surgical History  Procedure Laterality Date  . Cyst on left shoulder    . Colonoscopy N/A 05/16/2013    Procedure: COLONOSCOPY;  Surgeon: Daneil Dolin, MD;  Location: [facility];  Service: Endoscopy;  Laterality: N/A;  9:30   Family History  Problem Relation Age of Onset  . Colon cancer Neg Hx   . Liver disease Father     NASH? age 29   History  Substance Use Topics  . Smoking status: Never Smoker   . Smokeless tobacco: Not on file  . Alcohol Use: No    Review of Systems  Unable to perform ROS: Mental status change  Cardiovascular: Negative for chest pain.  Gastrointestinal: Negative for abdominal pain.      Allergies  Review of patient's allergies indicates no known allergies.  Home Medications   Prior to Admission medications   Medication Sig Start Date End Date Taking? Authorizing Provider  AZOPT 1 % ophthalmic suspension Place 1 drop into both eyes 2 (two) times daily.  03/15/13   Historical Provider, MD  polyethylene glycol-electrolytes (TRILYTE) 420 G solution Take 4,000 mLs by mouth as directed. 04/29/13   Daneil Dolin, MD  TRAVATAN Z 0.004 %  SOLN ophthalmic solution Place 1 drop into both eyes at bedtime.  04/17/13   Historical Provider, MD   BP 152/92 mmHg  Pulse 69  Temp(Src) 98.3 F (36.8 C)  Resp 17  SpO2 100% Physical Exam  Constitutional: He is oriented to person, place, and time. He appears well-developed.  HENT:  Head: Normocephalic.  Left facial droop  Eyes: Conjunctivae and EOM are normal. No scleral icterus.  Neck: Neck supple. No thyromegaly present.  Cardiovascular: Normal rate and regular rhythm.  Exam reveals no gallop and no friction rub.   No murmur heard. Pulmonary/Chest: No stridor. He has no wheezes. He has no rales. He exhibits no tenderness.  Abdominal: He exhibits no distension. There is no tenderness. There is no rebound.  Musculoskeletal: Normal range of motion. He exhibits no edema.  Severe left arm weakness.  Lymphadenopathy:    He has no cervical adenopathy.  Neurological: He is oriented to person, place, and time. He exhibits normal muscle tone. Coordination normal.  Skin: No rash noted. No erythema.  Psychiatric: He has a normal mood and affect. His behavior is normal.    ED Course  Procedures (including critical care time) Labs Review Labs Reviewed  URINALYSIS, ROUTINE W REFLEX MICROSCOPIC (NOT AT [facility]) - Abnormal; Notable for the following:    Glucose, UA 100 (*)    Hgb urine dipstick TRACE (*)    Nitrite POSITIVE (*)    Leukocytes,  UA MODERATE (*)    All other components within normal limits  CBC WITH DIFFERENTIAL/PLATELET - Abnormal; Notable for the following:    Monocytes Relative 14 (*)    All other components within normal limits  COMPREHENSIVE METABOLIC PANEL - Abnormal; Notable for the following:    Glucose, Bld 257 (*)    Total Protein 8.2 (*)    All other components within normal limits  ETHANOL - Abnormal; Notable for the following:    Alcohol, Ethyl (B) 6 (*)    All other components within normal limits  URINE MICROSCOPIC-ADD ON - Abnormal; Notable for the  following:    Bacteria, UA MANY (*)    All other components within normal limits  I-STAT CHEM 8, ED - Abnormal; Notable for the following:    Glucose, Bld 258 (*)    All other components within normal limits  URINE RAPID DRUG SCREEN, HOSP PERFORMED  APTT  TROPONIN I  I-STAT TROPOININ, ED  I-STAT CHEM 8, ED    Imaging Review Ct Head Wo Contrast  06/19/2015   CLINICAL DATA:  Left-sided weakness and slurred speech starting 1 hour ago.  EXAM: CT HEAD WITHOUT CONTRAST  TECHNIQUE: Contiguous axial images were obtained from the base of the skull through the vertex without intravenous contrast.  COMPARISON:  None.  FINDINGS: The brainstem, cerebellum, cerebral peduncles, thalamus, basal ganglia, basilar cisterns, and ventricular system appear within normal limits. No intracranial hemorrhage, mass lesion, or acute CVA.  There is newly complete opacification of the visualized portion of the right maxillary sinus with opacification of multiple right-sided ethmoid air cells and complete opacification of the right frontal sinus.  IMPRESSION: 1. Complete opacification of the right frontal sinus with opacification of multiple right-sided ethmoid air cells and subtotal opacification the right maxillary sinus. This may reflect acute or chronic sinusitis. No obvious cortical breakthrough is identified. 2. No acute CVA or other acute intracranial findings are observed. These results were called by telephone at the time of interpretation on 06/19/2015 at 5:43 pm to Dr. Milton Ferguson , who verbally acknowledged these results.   Electronically Signed   By: Van Clines M.D.   On: 06/19/2015 17:43     EKG Interpretation   Date/Time:  Friday June 19 2015 17:38:42 EDT Ventricular Rate:  72 PR Interval:  190 QRS Duration: 75 QT Interval:  407 QTC Calculation: 445 R Axis:   55 Text Interpretation:  Sinus rhythm Nonspecific T abnormalities, lateral  leads Confirmed by Alejandra Hunt  MD, Christabella Alvira [phone]) on 06/19/2015  7:39:22 PM     CRITICAL CARE Performed by: Anthem Frazer L Total critical care time:40 Critical care time was exclusive of separately billable procedures and treating other patients. Critical care was necessary to treat or prevent imminent or life-threatening deterioration. Critical care was time spent personally by me on the following activities: development of treatment plan with patient and/or surrogate as well as nursing, discussions with consultants, evaluation of patient's response to treatment, examination of patient, obtaining history from patient or surrogate, ordering and performing treatments and interventions, ordering and review of laboratory studies, ordering and review of radiographic studies, pulse oximetry and re-evaluation of patient's condition.   MDM   Final diagnoses:  Acute anterior circulation TIA    When pt returned from ct his symptoms had resolved.  Pt seen by neurology and he is to be admitted for tia work up    Milton Ferguson, MD 06/19/15 1940

## 2015-06-19 NOTE — ED Notes (Signed)
Patient arrived to ED at 1720 via [facility], was examined upon arrival by Dr. Roderic Palau and found to have left upper ext weakness and mild left sided droop when smiling. Patient to CT at 1722 accompanied by EMS providers. Back to ED room 10 at 1732, and Teleneuro began at 1735

## 2015-06-20 ENCOUNTER — Observation Stay (HOSPITAL_COMMUNITY): Payer: Managed Care, Other (non HMO)

## 2015-06-20 ENCOUNTER — Observation Stay (HOSPITAL_BASED_OUTPATIENT_CLINIC_OR_DEPARTMENT_OTHER): Payer: Managed Care, Other (non HMO)

## 2015-06-20 DIAGNOSIS — G459 Transient cerebral ischemic attack, unspecified: Secondary | ICD-10-CM

## 2015-06-20 DIAGNOSIS — I639 Cerebral infarction, unspecified: Secondary | ICD-10-CM | POA: Diagnosis not present

## 2015-06-20 HISTORY — PX: TRANSTHORACIC ECHOCARDIOGRAM: SHX275

## 2015-06-20 LAB — GLUCOSE, CAPILLARY
GLUCOSE-CAPILLARY: 225 mg/dL — AB (ref 65–99)
GLUCOSE-CAPILLARY: 181 mg/dL — AB (ref 65–99)
Glucose-Capillary: 170 mg/dL — ABNORMAL HIGH (ref 65–99)
Glucose-Capillary: 205 mg/dL — ABNORMAL HIGH (ref 65–99)

## 2015-06-20 LAB — LIPID PANEL
Cholesterol: 251 mg/dL — ABNORMAL HIGH (ref 0–200)
HDL: 31 mg/dL — ABNORMAL LOW (ref >40)
LDL Cholesterol: 175 mg/dL — ABNORMAL HIGH (ref 0–99)
Total CHOL/HDL Ratio: 8.1 RATIO
Triglycerides: 225 mg/dL — ABNORMAL HIGH (ref <150)
VLDL: 45 mg/dL — AB (ref 0–40)

## 2015-06-20 MED ORDER — ATORVASTATIN CALCIUM 40 MG PO TABS
80.0000 mg | ORAL_TABLET | Freq: Every day | ORAL | Status: DC
  Administered 2015-06-20: 80 mg via ORAL
  Filled 2015-06-20: qty 2

## 2015-06-20 NOTE — Progress Notes (Signed)
  Echocardiogram 2D Echocardiogram has been performed.  Lysle Rubens 06/20/2015, 3:56 PM

## 2015-06-20 NOTE — Progress Notes (Signed)
Patient and family wanted to know if patient would be discharged today. All of patients test were complete. MD on call was notified. Awaiting response at this time. Will continue to monitor patient at this time.

## 2015-06-20 NOTE — Progress Notes (Signed)
Provided diabetes handouts and education to patient at the bedside. I allowed patient to ask questions and also used the teach back method with patient. Patient currently has glucometer machine at home and is aware of how to check his blood sugars twice a day. Patient has no questions or concerns at this time. Will continue to educate patient at this time.

## 2015-06-20 NOTE — Progress Notes (Signed)
Subjective: He was admitted yesterday with TIA. He says he feels okay now. He was also discovered to have very high lipids and to have elevated blood sugar. His workup is underway. He says he feels okay now  Objective: Vital signs in last 24 hours: Temp:  [97.7 F (36.5 C)-98.3 F (36.8 C)] 97.7 F (36.5 C) (06/25 0800) Pulse Rate:  [48-69] 48 (06/25 0800) Resp:  [15-18] 17 (06/25 0800) BP: (152-166)/(78-92) 154/78 mmHg (06/25 0800) SpO2:  [94 %-100 %] 100 % (06/25 0800) Weight:  [87.862 kg (193 lb 11.2 oz)] 87.862 kg (193 lb 11.2 oz) (06/24 2324) Weight change:  Last BM Date: 06/18/15  Intake/Output from previous day: 06/24 0701 - 06/25 0700 In: -  Out: 200 [Urine:200]  PHYSICAL EXAM General appearance: alert, cooperative and no distress Resp: clear to auscultation bilaterally Cardio: regular rate and rhythm, S1, S2 normal, no murmur, click, rub or gallop GI: soft, non-tender; bowel sounds normal; no masses,  no organomegaly Extremities: extremities normal, atraumatic, no cyanosis or edema  Lab Results:  Results for orders placed or performed during the hospital encounter of 06/19/15 (from the past 48 hour(s))  CBC with Differential     Status: Abnormal   Collection Time: 06/19/15  5:34 PM  Result Value Ref Range   WBC 5.7 4.0 - 10.5 K/uL   RBC 5.11 4.22 - 5.81 MIL/uL   Hemoglobin 14.6 13.0 - 17.0 g/dL   HCT 44.0 39.0 - 52.0 %   MCV 86.1 78.0 - 100.0 fL   MCH 28.6 26.0 - 34.0 pg   MCHC 33.2 30.0 - 36.0 g/dL   RDW 14.5 11.5 - 15.5 %   Platelets 218 150 - 400 K/uL   Neutrophils Relative % 48 43 - 77 %   Neutro Abs 2.7 1.7 - 7.7 K/uL   Lymphocytes Relative 35 12 - 46 %   Lymphs Abs 2.0 0.7 - 4.0 K/uL   Monocytes Relative 14 (H) 3 - 12 %   Monocytes Absolute 0.8 0.1 - 1.0 K/uL   Eosinophils Relative 3 0 - 5 %   Eosinophils Absolute 0.2 0.0 - 0.7 K/uL   Basophils Relative 0 0 - 1 %   Basophils Absolute 0.0 0.0 - 0.1 K/uL  Comprehensive metabolic panel     Status:  Abnormal   Collection Time: 06/19/15  5:34 PM  Result Value Ref Range   Sodium 138 135 - 145 mmol/Brooks   Potassium 4.1 3.5 - 5.1 mmol/Brooks   Chloride 103 101 - 111 mmol/Brooks   CO2 25 22 - 32 mmol/Brooks   Glucose, Bld 257 (H) 65 - 99 mg/dL   BUN 17 6 - 20 mg/dL   Creatinine, Ser 1.04 0.61 - 1.24 mg/dL   Calcium 9.0 8.9 - 10.3 mg/dL   Total Protein 8.2 (H) 6.5 - 8.1 g/dL   Albumin 3.5 3.5 - 5.0 g/dL   AST 29 15 - 41 U/Brooks   ALT 20 17 - 63 U/Brooks   Alkaline Phosphatase 77 38 - 126 U/Brooks   Total Bilirubin 0.9 0.3 - 1.2 mg/dL   GFR calc non Af Amer >60 >60 mL/min   GFR calc Af Amer >60 >60 mL/min    Comment: (NOTE) The eGFR has been calculated using the CKD EPI equation. This calculation has not been validated in all clinical situations. eGFR's persistently <60 mL/min signify possible Chronic Kidney Disease.    Anion gap 10 5 - 15  Ethanol     Status: Abnormal   Collection  Time: 06/19/15  5:34 PM  Result Value Ref Range   Alcohol, Ethyl (B) 6 (H) <5 mg/dL    Comment:        LOWEST DETECTABLE LIMIT FOR SERUM ALCOHOL IS 5 mg/dL FOR MEDICAL PURPOSES ONLY   APTT     Status: None   Collection Time: 06/19/15  5:34 PM  Result Value Ref Range   aPTT 34 24 - 37 seconds  Troponin I     Status: None   Collection Time: 06/19/15  5:34 PM  Result Value Ref Range   Troponin I <0.03 <0.031 ng/mL    Comment:        NO INDICATION OF MYOCARDIAL INJURY.   I-Stat Chem 8, ED  (not at [facility])     Status: Abnormal   Collection Time: 06/19/15  5:39 PM  Result Value Ref Range   Sodium 139 135 - 145 mmol/Brooks   Potassium 4.3 3.5 - 5.1 mmol/Brooks   Chloride 103 101 - 111 mmol/Brooks   BUN 17 6 - 20 mg/dL   Creatinine, Ser 1.00 0.61 - 1.24 mg/dL   Glucose, Bld 258 (H) 65 - 99 mg/dL   Calcium, Ion 1.18 1.13 - 1.30 mmol/Brooks   TCO2 23 0 - 100 mmol/Brooks   Hemoglobin 16.7 13.0 - 17.0 g/dL   HCT 49.0 39.0 - 52.0 %  I-stat troponin, ED (not at [facility], [facility])     Status: None   Collection Time: 06/19/15  5:39 PM  Result Value Ref  Range   Troponin i, poc 0.00 0.00 - 0.08 ng/mL   Comment 3            Comment: Due to the release kinetics of cTnI, a negative result within the first hours of the onset of symptoms does not rule out myocardial infarction with certainty. If myocardial infarction is still suspected, repeat the test at appropriate intervals.   Urine rapid drug screen (hosp performed)not at [facility]     Status: None   Collection Time: 06/19/15  6:19 PM  Result Value Ref Range   Opiates NONE DETECTED NONE DETECTED   Cocaine NONE DETECTED NONE DETECTED   Benzodiazepines NONE DETECTED NONE DETECTED   Amphetamines NONE DETECTED NONE DETECTED   Tetrahydrocannabinol NONE DETECTED NONE DETECTED   Barbiturates NONE DETECTED NONE DETECTED    Comment:        DRUG SCREEN FOR MEDICAL PURPOSES ONLY.  IF CONFIRMATION IS NEEDED FOR ANY PURPOSE, NOTIFY LAB WITHIN 5 DAYS.        LOWEST DETECTABLE LIMITS FOR URINE DRUG SCREEN Drug Class       Cutoff (ng/mL) Amphetamine      1000 Barbiturate      200 Benzodiazepine   010 Tricyclics       272 Opiates          300 Cocaine          300 THC              50   Urinalysis, Routine w reflex microscopic (not at [facility])     Status: Abnormal   Collection Time: 06/19/15  6:19 PM  Result Value Ref Range   Color, Urine YELLOW YELLOW   APPearance CLEAR CLEAR   Specific Gravity, Urine 1.025 1.005 - 1.030   pH 5.5 5.0 - 8.0   Glucose, UA 100 (A) NEGATIVE mg/dL   Hgb urine dipstick TRACE (A) NEGATIVE   Bilirubin Urine NEGATIVE NEGATIVE   Ketones, ur NEGATIVE NEGATIVE mg/dL  Protein, ur NEGATIVE NEGATIVE mg/dL   Urobilinogen, UA 0.2 0.0 - 1.0 mg/dL   Nitrite POSITIVE (A) NEGATIVE   Leukocytes, UA MODERATE (A) NEGATIVE  Urine microscopic-add on     Status: Abnormal   Collection Time: 06/19/15  6:19 PM  Result Value Ref Range   Squamous Epithelial / LPF RARE RARE   WBC, UA 21-50 <3 WBC/hpf   RBC / HPF 3-6 <3 RBC/hpf   Bacteria, UA MANY (A) RARE  Glucose, capillary      Status: Abnormal   Collection Time: 06/19/15 11:22 PM  Result Value Ref Range   Glucose-Capillary 231 (H) 65 - 99 mg/dL   Comment 1 Notify RN    Comment 2 Document in Chart   Lipid panel     Status: Abnormal   Collection Time: 06/20/15  4:49 AM  Result Value Ref Range   Cholesterol 251 (H) 0 - 200 mg/dL   Triglycerides 225 (H) <150 mg/dL   HDL 31 (Brooks) >40 mg/dL   Total CHOL/HDL Ratio 8.1 RATIO   VLDL 45 (H) 0 - 40 mg/dL   LDL Cholesterol 175 (H) 0 - 99 mg/dL    Comment:        Total Cholesterol/HDL:CHD Risk Coronary Heart Disease Risk Table                     Men   Women  1/2 Average Risk   3.4   3.3  Average Risk       5.0   4.4  2 X Average Risk   9.6   7.1  3 X Average Risk  23.4   11.0        Use the calculated Patient Ratio above and the CHD Risk Table to determine the patient's CHD Risk.        ATP III CLASSIFICATION (LDL):  <100     mg/dL   Optimal  100-129  mg/dL   Near or Above                    Optimal  130-159  mg/dL   Borderline  160-189  mg/dL   High  >190     mg/dL   Very High   Glucose, capillary     Status: Abnormal   Collection Time: 06/20/15  7:46 AM  Result Value Ref Range   Glucose-Capillary 170 (H) 65 - 99 mg/dL   Comment 1 Notify RN     ABGS  Recent Labs  06/19/15 1739  TCO2 23   CULTURES No results found for this or any previous visit (from the past 240 hour(s)). Studies/Results: Ct Head Wo Contrast  06/19/2015   CLINICAL DATA:  Left-sided weakness and slurred speech starting 1 hour ago.  EXAM: CT HEAD WITHOUT CONTRAST  TECHNIQUE: Contiguous axial images were obtained from the base of the skull through the vertex without intravenous contrast.  COMPARISON:  None.  FINDINGS: The brainstem, cerebellum, cerebral peduncles, thalamus, basal ganglia, basilar cisterns, and ventricular system appear within normal limits. No intracranial hemorrhage, mass lesion, or acute CVA.  There is newly complete opacification of the visualized portion of the  right maxillary sinus with opacification of multiple right-sided ethmoid air cells and complete opacification of the right frontal sinus.  IMPRESSION: 1. Complete opacification of the right frontal sinus with opacification of multiple right-sided ethmoid air cells and subtotal opacification the right maxillary sinus. This may reflect acute or chronic sinusitis. No obvious cortical breakthrough is identified. 2.  No acute CVA or other acute intracranial findings are observed. These results were called by telephone at the time of interpretation on 06/19/2015 at 5:43 pm to Dr. Milton Ferguson , who verbally acknowledged these results.   Electronically Signed   By: Van Clines M.D.   On: 06/19/2015 17:43    Medications:  Prior to Admission:  Prescriptions prior to admission  Medication Sig Dispense Refill Last Dose  . AZOPT 1 % ophthalmic suspension Place 1 drop into both eyes 2 (two) times daily.    06/19/2015 at Unknown time  . TRAVATAN Z 0.004 % SOLN ophthalmic solution Place 1 drop into both eyes at bedtime.    06/18/2015 at Unknown time  . polyethylene glycol-electrolytes (TRILYTE) 420 G solution Take 4,000 mLs by mouth as directed. (Patient not taking: Reported on 06/19/2015) 4000 mL 0 05/15/2013 at 1700   Scheduled: .  stroke: mapping our early stages of recovery book   Does not apply Once  . aspirin  325 mg Oral Daily  . atorvastatin  80 mg Oral q1800  . brinzolamide  1 drop Both Eyes BID  . cefUROXime  500 mg Oral BID WC  . enoxaparin (LOVENOX) injection  40 mg Subcutaneous Q24H  . insulin aspart  0-5 Units Subcutaneous QHS  . insulin aspart  0-9 Units Subcutaneous TID WC  . latanoprost  1 drop Both Eyes QHS   Continuous:  PRN:  Assesment: He was admitted with TIA. During his workup he has been found to have elevated blood sugar and elevated lipids. Principal Problem:   Brain TIA    Plan: Continue workup for TIA. He is on sliding scale insulin and I will have him get hemoglobin  A1c. I will start him on 80 mg of atorvastatin.      Jon Brooks 06/20/2015, 10:08 AM

## 2015-06-21 DIAGNOSIS — J019 Acute sinusitis, unspecified: Secondary | ICD-10-CM | POA: Diagnosis present

## 2015-06-21 DIAGNOSIS — E1169 Type 2 diabetes mellitus with other specified complication: Secondary | ICD-10-CM | POA: Diagnosis present

## 2015-06-21 DIAGNOSIS — E1165 Type 2 diabetes mellitus with hyperglycemia: Secondary | ICD-10-CM | POA: Diagnosis present

## 2015-06-21 DIAGNOSIS — I10 Essential (primary) hypertension: Secondary | ICD-10-CM | POA: Diagnosis present

## 2015-06-21 DIAGNOSIS — I639 Cerebral infarction, unspecified: Secondary | ICD-10-CM | POA: Diagnosis not present

## 2015-06-21 DIAGNOSIS — IMO0002 Reserved for concepts with insufficient information to code with codable children: Secondary | ICD-10-CM | POA: Diagnosis present

## 2015-06-21 DIAGNOSIS — E785 Hyperlipidemia, unspecified: Secondary | ICD-10-CM

## 2015-06-21 LAB — GLUCOSE, CAPILLARY: Glucose-Capillary: 191 mg/dL — ABNORMAL HIGH (ref 65–99)

## 2015-06-21 MED ORDER — ASPIRIN 325 MG PO TABS: 325.0000 mg | ORAL_TABLET | Freq: Every day | ORAL | Status: DC

## 2015-06-21 MED ORDER — METFORMIN HCL 500 MG PO TABS: 500.0000 mg | ORAL_TABLET | Freq: Two times a day (BID) | ORAL | Status: DC

## 2015-06-21 MED ORDER — CEFUROXIME AXETIL 500 MG PO TABS: 500.0000 mg | ORAL_TABLET | Freq: Two times a day (BID) | ORAL | Status: DC

## 2015-06-21 MED ORDER — LISINOPRIL 2.5 MG PO TABS: 2.5000 mg | ORAL_TABLET | Freq: Every day | ORAL | Status: AC

## 2015-06-21 MED ORDER — STROKE: EARLY STAGES OF RECOVERY BOOK: 1.0000 | Freq: Once | Status: DC

## 2015-06-21 MED ORDER — ATORVASTATIN CALCIUM 80 MG PO TABS: 80.0000 mg | ORAL_TABLET | Freq: Every day | ORAL | Status: AC

## 2015-06-21 NOTE — Progress Notes (Signed)
Patient being d/c home with instructions. IV cath removed and intact. No c/o pain at this time and at the site. Family at bedside.

## 2015-06-21 NOTE — Progress Notes (Signed)
Subjective: He feels well. His echocardiogram is still pending but the rest of his studies have been done and don't show anything that needs any acute intervention. He is not having any symptoms.  Objective: Vital signs in last 24 hours: Temp:  [98.2 F (36.8 C)-98.4 F (36.9 C)] 98.4 F (36.9 C) (06/26 0601) Pulse Rate:  [63-66] 65 (06/26 0601) Resp:  [17-18] 18 (06/25 2200) BP: (136-171)/(72-90) 139/73 mmHg (06/26 0601) SpO2:  [95 %-98 %] 96 % (06/26 0601) Weight change:  Last BM Date: 06/19/15  Intake/Output from previous day: 06/25 0701 - 06/26 0700 In: 720 [P.O.:720] Out: 1000 [Urine:1000]  PHYSICAL EXAM General appearance: alert, cooperative and no distress Resp: clear to auscultation bilaterally Cardio: regular rate and rhythm, S1, S2 normal, no murmur, click, rub or gallop GI: soft, non-tender; bowel sounds normal; no masses,  no organomegaly Extremities: extremities normal, atraumatic, no cyanosis or edema  Lab Results:  Results for orders placed or performed during the hospital encounter of 06/19/15 (from the past 48 hour(s))  CBC with Differential     Status: Abnormal   Collection Time: 06/19/15  5:34 PM  Result Value Ref Range   WBC 5.7 4.0 - 10.5 K/uL   RBC 5.11 4.22 - 5.81 MIL/uL   Hemoglobin 14.6 13.0 - 17.0 g/dL   HCT 44.0 39.0 - 52.0 %   MCV 86.1 78.0 - 100.0 fL   MCH 28.6 26.0 - 34.0 pg   MCHC 33.2 30.0 - 36.0 g/dL   RDW 14.5 11.5 - 15.5 %   Platelets 218 150 - 400 K/uL   Neutrophils Relative % 48 43 - 77 %   Neutro Abs 2.7 1.7 - 7.7 K/uL   Lymphocytes Relative 35 12 - 46 %   Lymphs Abs 2.0 0.7 - 4.0 K/uL   Monocytes Relative 14 (H) 3 - 12 %   Monocytes Absolute 0.8 0.1 - 1.0 K/uL   Eosinophils Relative 3 0 - 5 %   Eosinophils Absolute 0.2 0.0 - 0.7 K/uL   Basophils Relative 0 0 - 1 %   Basophils Absolute 0.0 0.0 - 0.1 K/uL  Comprehensive metabolic panel     Status: Abnormal   Collection Time: 06/19/15  5:34 PM  Result Value Ref Range   Sodium  138 135 - 145 mmol/L   Potassium 4.1 3.5 - 5.1 mmol/L   Chloride 103 101 - 111 mmol/L   CO2 25 22 - 32 mmol/L   Glucose, Bld 257 (H) 65 - 99 mg/dL   BUN 17 6 - 20 mg/dL   Creatinine, Ser 1.04 0.61 - 1.24 mg/dL   Calcium 9.0 8.9 - 10.3 mg/dL   Total Protein 8.2 (H) 6.5 - 8.1 g/dL   Albumin 3.5 3.5 - 5.0 g/dL   AST 29 15 - 41 U/L   ALT 20 17 - 63 U/L   Alkaline Phosphatase 77 38 - 126 U/L   Total Bilirubin 0.9 0.3 - 1.2 mg/dL   GFR calc non Af Amer >60 >60 mL/min   GFR calc Af Amer >60 >60 mL/min    Comment: (NOTE) The eGFR has been calculated using the CKD EPI equation. This calculation has not been validated in all clinical situations. eGFR's persistently <60 mL/min signify possible Chronic Kidney Disease.    Anion gap 10 5 - 15  Ethanol     Status: Abnormal   Collection Time: 06/19/15  5:34 PM  Result Value Ref Range   Alcohol, Ethyl (B) 6 (H) <5 mg/dL  Comment:        LOWEST DETECTABLE LIMIT FOR SERUM ALCOHOL IS 5 mg/dL FOR MEDICAL PURPOSES ONLY   APTT     Status: None   Collection Time: 06/19/15  5:34 PM  Result Value Ref Range   aPTT 34 24 - 37 seconds  Troponin I     Status: None   Collection Time: 06/19/15  5:34 PM  Result Value Ref Range   Troponin I <0.03 <0.031 ng/mL    Comment:        NO INDICATION OF MYOCARDIAL INJURY.   I-Stat Chem 8, ED  (not at [facility], [facility])     Status: Abnormal   Collection Time: 06/19/15  5:39 PM  Result Value Ref Range   Sodium 139 135 - 145 mmol/L   Potassium 4.3 3.5 - 5.1 mmol/L   Chloride 103 101 - 111 mmol/L   BUN 17 6 - 20 mg/dL   Creatinine, Ser 1.00 0.61 - 1.24 mg/dL   Glucose, Bld 258 (H) 65 - 99 mg/dL   Calcium, Ion 1.18 1.13 - 1.30 mmol/L   TCO2 23 0 - 100 mmol/L   Hemoglobin 16.7 13.0 - 17.0 g/dL   HCT 49.0 39.0 - 52.0 %  I-stat troponin, ED (not at [facility], [facility])     Status: None   Collection Time: 06/19/15  5:39 PM  Result Value Ref Range   Troponin i, poc 0.00 0.00 - 0.08 ng/mL   Comment 3            Comment: Due  to the release kinetics of cTnI, a negative result within the first hours of the onset of symptoms does not rule out myocardial infarction with certainty. If myocardial infarction is still suspected, repeat the test at appropriate intervals.   Urine rapid drug screen (hosp performed)not at [facility]     Status: None   Collection Time: 06/19/15  6:19 PM  Result Value Ref Range   Opiates NONE DETECTED NONE DETECTED   Cocaine NONE DETECTED NONE DETECTED   Benzodiazepines NONE DETECTED NONE DETECTED   Amphetamines NONE DETECTED NONE DETECTED   Tetrahydrocannabinol NONE DETECTED NONE DETECTED   Barbiturates NONE DETECTED NONE DETECTED    Comment:        DRUG SCREEN FOR MEDICAL PURPOSES ONLY.  IF CONFIRMATION IS NEEDED FOR ANY PURPOSE, NOTIFY LAB WITHIN 5 DAYS.        LOWEST DETECTABLE LIMITS FOR URINE DRUG SCREEN Drug Class       Cutoff (ng/mL) Amphetamine      1000 Barbiturate      200 Benzodiazepine   892 Tricyclics       119 Opiates          300 Cocaine          300 THC              50   Urinalysis, Routine w reflex microscopic (not at [facility])     Status: Abnormal   Collection Time: 06/19/15  6:19 PM  Result Value Ref Range   Color, Urine YELLOW YELLOW   APPearance CLEAR CLEAR   Specific Gravity, Urine 1.025 1.005 - 1.030   pH 5.5 5.0 - 8.0   Glucose, UA 100 (A) NEGATIVE mg/dL   Hgb urine dipstick TRACE (A) NEGATIVE   Bilirubin Urine NEGATIVE NEGATIVE   Ketones, ur NEGATIVE NEGATIVE mg/dL   Protein, ur NEGATIVE NEGATIVE mg/dL   Urobilinogen, UA 0.2 0.0 - 1.0 mg/dL   Nitrite POSITIVE (A) NEGATIVE  Leukocytes, UA MODERATE (A) NEGATIVE  Urine microscopic-add on     Status: Abnormal   Collection Time: 06/19/15  6:19 PM  Result Value Ref Range   Squamous Epithelial / LPF RARE RARE   WBC, UA 21-50 <3 WBC/hpf   RBC / HPF 3-6 <3 RBC/hpf   Bacteria, UA MANY (A) RARE  Glucose, capillary     Status: Abnormal   Collection Time: 06/19/15 11:22 PM  Result Value Ref Range    Glucose-Capillary 231 (H) 65 - 99 mg/dL   Comment 1 Notify RN    Comment 2 Document in Chart   Lipid panel     Status: Abnormal   Collection Time: 06/20/15  4:49 AM  Result Value Ref Range   Cholesterol 251 (H) 0 - 200 mg/dL   Triglycerides 225 (H) <150 mg/dL   HDL 31 (L) >40 mg/dL   Total CHOL/HDL Ratio 8.1 RATIO   VLDL 45 (H) 0 - 40 mg/dL   LDL Cholesterol 175 (H) 0 - 99 mg/dL    Comment:        Total Cholesterol/HDL:CHD Risk Coronary Heart Disease Risk Table                     Men   Women  1/2 Average Risk   3.4   3.3  Average Risk       5.0   4.4  2 X Average Risk   9.6   7.1  3 X Average Risk  23.4   11.0        Use the calculated Patient Ratio above and the CHD Risk Table to determine the patient's CHD Risk.        ATP III CLASSIFICATION (LDL):  <100     mg/dL   Optimal  100-129  mg/dL   Near or Above                    Optimal  130-159  mg/dL   Borderline  160-189  mg/dL   High  >190     mg/dL   Very High   Glucose, capillary     Status: Abnormal   Collection Time: 06/20/15  7:46 AM  Result Value Ref Range   Glucose-Capillary 170 (H) 65 - 99 mg/dL   Comment 1 Notify RN   Glucose, capillary     Status: Abnormal   Collection Time: 06/20/15 11:40 AM  Result Value Ref Range   Glucose-Capillary 225 (H) 65 - 99 mg/dL   Comment 1 Notify RN   Glucose, capillary     Status: Abnormal   Collection Time: 06/20/15  4:35 PM  Result Value Ref Range   Glucose-Capillary 181 (H) 65 - 99 mg/dL  Glucose, capillary     Status: Abnormal   Collection Time: 06/20/15  8:39 PM  Result Value Ref Range   Glucose-Capillary 205 (H) 65 - 99 mg/dL   Comment 1 Notify RN    Comment 2 Document in Chart   Glucose, capillary     Status: Abnormal   Collection Time: 06/21/15  7:24 AM  Result Value Ref Range   Glucose-Capillary 191 (H) 65 - 99 mg/dL   Comment 1 Notify RN     ABGS  Recent Labs  06/19/15 1739  TCO2 23   CULTURES No results found for this or any previous visit (from  the past 240 hour(s)). Studies/Results: Ct Head Wo Contrast  06/19/2015   CLINICAL DATA:  Left-sided weakness and slurred speech  starting 1 hour ago.  EXAM: CT HEAD WITHOUT CONTRAST  TECHNIQUE: Contiguous axial images were obtained from the base of the skull through the vertex without intravenous contrast.  COMPARISON:  None.  FINDINGS: The brainstem, cerebellum, cerebral peduncles, thalamus, basal ganglia, basilar cisterns, and ventricular system appear within normal limits. No intracranial hemorrhage, mass lesion, or acute CVA.  There is newly complete opacification of the visualized portion of the right maxillary sinus with opacification of multiple right-sided ethmoid air cells and complete opacification of the right frontal sinus.  IMPRESSION: 1. Complete opacification of the right frontal sinus with opacification of multiple right-sided ethmoid air cells and subtotal opacification the right maxillary sinus. This may reflect acute or chronic sinusitis. No obvious cortical breakthrough is identified. 2. No acute CVA or other acute intracranial findings are observed. These results were called by telephone at the time of interpretation on 06/19/2015 at 5:43 pm to Dr. Milton Ferguson , who verbally acknowledged these results.   Electronically Signed   By: Van Clines M.D.   On: 06/19/2015 17:43   Mr Jodene Nam Head Wo Contrast  06/20/2015   CLINICAL DATA:  Episode of altered mental status and weakness yesterday lasting for 1 hour.  EXAM: MRI HEAD WITHOUT CONTRAST  MRA HEAD WITHOUT CONTRAST  TECHNIQUE: Multiplanar, multiecho pulse sequences of the brain and surrounding structures were obtained without intravenous contrast. Angiographic images of the head were obtained using MRA technique without contrast.  COMPARISON:  Head CT 06/19/2015  FINDINGS: MRI HEAD FINDINGS  There is no evidence of acute infarct, intracranial hemorrhage, mass, midline shift, or extra-axial fluid collection. There is mild generalized  cerebral atrophy. Scattered foci of T2 hyperintensity in the subcortical and deep cerebral white matter bilaterally are nonspecific but compatible with mild chronic small vessel ischemic disease.  Orbits are unremarkable. There is complete opacification of the right frontal sinus with subtotal opacification of the anterior right ethmoid air cells and right maxillary sinus. A trace left mastoid effusion is noted. The distal left vertebral artery appears hypoplastic. Other major intracranial vascular flow voids are preserved. Visualized portion of the upper cervical spine demonstrates disc degeneration it C3-4 with focal kyphosis and mild impression on the spinal cord, incompletely evaluated.  MRA HEAD FINDINGS  The visualized distal right vertebral artery is patent and dominant. Decreased signal intensity in the proximal right V4 segment is likely technical, although underlying mild stenosis is possible. The visualized distal left vertebral artery appears hypoplastic and its V4 segment is not well seen, likely ending in PICA. Right PICA origin is not clearly identified. SCA origins are patent. Basilar artery is patent without stenosis.  There is a fetal origin of the right PCA. There is a patent left posterior communicating artery as well. The PCAs appears severely attenuated and are not well evaluated bilaterally, although this may be technical as the PCA flow voids appear more robust on the accompanying brain MRI.  The visualized distal cervical ICA on the left is tortuous. Intracranial ICAs are patent without evidence of significant stenosis. The left A1 segment is mildly hypoplastic. ACAs are otherwise unremarkable. M1 segments are patent without evidence of significant proximal stenosis. Distal M1 segments and proximal M2 segments are not well evaluated due to decreased signal which is felt to be largely if not completely artifactual. No intracranial aneurysm is identified.  IMPRESSION: 1. No acute intracranial  abnormality. 2. Mild chronic small vessel ischemic disease. 3. Right-sided paranasal sinusitis. 4. No evidence of major intracranial arterial occlusion. Poor visualization  of the PCAs and distal M1/proximal M2 segments of the MCAs, felt to be largely technical in nature.   Electronically Signed   By: Logan Bores   On: 06/20/2015 10:58   Mri Brain Without Contrast  06/20/2015   CLINICAL DATA:  Episode of altered mental status and weakness yesterday lasting for 1 hour.  EXAM: MRI HEAD WITHOUT CONTRAST  MRA HEAD WITHOUT CONTRAST  TECHNIQUE: Multiplanar, multiecho pulse sequences of the brain and surrounding structures were obtained without intravenous contrast. Angiographic images of the head were obtained using MRA technique without contrast.  COMPARISON:  Head CT 06/19/2015  FINDINGS: MRI HEAD FINDINGS  There is no evidence of acute infarct, intracranial hemorrhage, mass, midline shift, or extra-axial fluid collection. There is mild generalized cerebral atrophy. Scattered foci of T2 hyperintensity in the subcortical and deep cerebral white matter bilaterally are nonspecific but compatible with mild chronic small vessel ischemic disease.  Orbits are unremarkable. There is complete opacification of the right frontal sinus with subtotal opacification of the anterior right ethmoid air cells and right maxillary sinus. A trace left mastoid effusion is noted. The distal left vertebral artery appears hypoplastic. Other major intracranial vascular flow voids are preserved. Visualized portion of the upper cervical spine demonstrates disc degeneration it C3-4 with focal kyphosis and mild impression on the spinal cord, incompletely evaluated.  MRA HEAD FINDINGS  The visualized distal right vertebral artery is patent and dominant. Decreased signal intensity in the proximal right V4 segment is likely technical, although underlying mild stenosis is possible. The visualized distal left vertebral artery appears hypoplastic and  its V4 segment is not well seen, likely ending in PICA. Right PICA origin is not clearly identified. SCA origins are patent. Basilar artery is patent without stenosis.  There is a fetal origin of the right PCA. There is a patent left posterior communicating artery as well. The PCAs appears severely attenuated and are not well evaluated bilaterally, although this may be technical as the PCA flow voids appear more robust on the accompanying brain MRI.  The visualized distal cervical ICA on the left is tortuous. Intracranial ICAs are patent without evidence of significant stenosis. The left A1 segment is mildly hypoplastic. ACAs are otherwise unremarkable. M1 segments are patent without evidence of significant proximal stenosis. Distal M1 segments and proximal M2 segments are not well evaluated due to decreased signal which is felt to be largely if not completely artifactual. No intracranial aneurysm is identified.  IMPRESSION: 1. No acute intracranial abnormality. 2. Mild chronic small vessel ischemic disease. 3. Right-sided paranasal sinusitis. 4. No evidence of major intracranial arterial occlusion. Poor visualization of the PCAs and distal M1/proximal M2 segments of the MCAs, felt to be largely technical in nature.   Electronically Signed   By: Logan Bores   On: 06/20/2015 10:58   US Carotid Bilateral  06/20/2015   CLINICAL DATA:  TIA symptoms  EXAM: BILATERAL CAROTID DUPLEX ULTRASOUND  TECHNIQUE: Pearline Cables scale imaging, color Doppler and duplex ultrasound were performed of bilateral carotid and vertebral arteries in the neck.  COMPARISON:  None.  FINDINGS: Criteria: Quantification of carotid stenosis is based on velocity parameters that correlate the residual internal carotid diameter with NASCET-based stenosis levels, using the diameter of the distal internal carotid lumen as the denominator for stenosis measurement.  The following velocity measurements were obtained:  RIGHT  ICA:  60/18 cm/sec  CCA:  44/03  cm/sec  SYSTOLIC ICA/CCA RATIO:  0.8  DIASTOLIC ICA/CCA RATIO:  1.5  ECA:  84 cm/sec  LEFT  ICA:  70/13 cm/sec  CCA:  498/26 cm/sec  SYSTOLIC ICA/CCA RATIO:  0.7  DIASTOLIC ICA/CCA RATIO:  1.0  ECA:  96 cm/sec  RIGHT CAROTID ARTERY: The grayscale images show mild intimal thickening without significant plaque formation. The waveforms, velocities and flow velocity ratios demonstrate no evidence of focal hemodynamically significant stenosis.  RIGHT VERTEBRAL ARTERY:  Antegrade in nature.  LEFT CAROTID ARTERY: Grayscale images demonstrate no significant plaque formation. The waveforms, velocities and flow velocity ratios show no evidence of focal hemodynamically significant stenosis.  LEFT VERTEBRAL ARTERY:  Antegrade in nature.  IMPRESSION: No evidence of focal hemodynamically significant carotid stenosis.   Electronically Signed   By: Inez Catalina M.D.   On: 06/20/2015 10:10    Medications:  Prior to Admission:  Prescriptions prior to admission  Medication Sig Dispense Refill Last Dose  . AZOPT 1 % ophthalmic suspension Place 1 drop into both eyes 2 (two) times daily.    06/19/2015 at Unknown time  . TRAVATAN Z 0.004 % SOLN ophthalmic solution Place 1 drop into both eyes at bedtime.    06/18/2015 at Unknown time  . polyethylene glycol-electrolytes (TRILYTE) 420 G solution Take 4,000 mLs by mouth as directed. (Patient not taking: Reported on 06/19/2015) 4000 mL 0 05/15/2013 at 1700   Scheduled: .  stroke: mapping our early stages of recovery book   Does not apply Once  . aspirin  325 mg Oral Daily  . atorvastatin  80 mg Oral q1800  . brinzolamide  1 drop Both Eyes BID  . cefUROXime  500 mg Oral BID WC  . enoxaparin (LOVENOX) injection  40 mg Subcutaneous Q24H  . insulin aspart  0-5 Units Subcutaneous QHS  . insulin aspart  0-9 Units Subcutaneous TID WC  . latanoprost  1 drop Both Eyes QHS   Continuous:  PRN:  Assesment: He was admitted with a TIA. During his hospitalization he has been discovered  to have a sinus infection which is being treated with antibiotics. He is not having any symptoms from TIA now.  During his hospitalization he has been noted to have some elevation of his blood pressure so is going to start on blood pressure medications at home  He has been noted to be diabetic and will be treated for diabetes at home.  He has hyperlipidemia which will need to be treated Principal Problem:   Brain TIA    Plan: He can be discharged home today.      Sherrick Araki L 06/21/2015, 9:27 AM

## 2015-06-21 NOTE — Discharge Summary (Signed)
Physician Discharge Summary  Patient ID: Jon Brooks MRN: [id number] DOB/AGE: 06/07/1941 74 y.o. Primary Care Physician:Aubreana Cornacchia L, MD Admit date: 06/19/2015 Discharge date: 06/21/2015    Discharge Diagnoses:   Principal Problem:   Brain TIA Active Problems:   Acute sinusitis   Essential hypertension, benign   Hyperlipidemia associated with type 2 diabetes mellitus   Type II diabetes mellitus, uncontrolled     Medication List    STOP taking these medications        polyethylene glycol-electrolytes 420 G solution  Commonly known as:  TRILYTE      TAKE these medications         stroke: mapping our early stages of recovery book Misc  1 each by Does not apply route once.     aspirin 325 MG tablet  Take 1 tablet (325 mg total) by mouth daily.     atorvastatin 80 MG tablet  Commonly known as:  LIPITOR  Take 1 tablet (80 mg total) by mouth daily at 6 PM.     AZOPT 1 % ophthalmic suspension  Generic drug:  brinzolamide  Place 1 drop into both eyes 2 (two) times daily.     cefUROXime 500 MG tablet  Commonly known as:  CEFTIN  Take 1 tablet (500 mg total) by mouth 2 (two) times daily with a meal.     lisinopril 2.5 MG tablet  Commonly known as:  PRINIVIL,ZESTRIL  Take 1 tablet (2.5 mg total) by mouth daily.     metFORMIN 500 MG tablet  Commonly known as:  GLUCOPHAGE  Take 1 tablet (500 mg total) by mouth 2 (two) times daily with a meal.     TRAVATAN Z 0.004 % Soln ophthalmic solution  Generic drug:  Travoprost (BAK Free)  Place 1 drop into both eyes at bedtime.        Discharged Condition: Improved    Consults: None  Significant Diagnostic Studies: Ct Head Wo Contrast  06/19/2015   CLINICAL DATA:  Left-sided weakness and slurred speech starting 1 hour ago.  EXAM: CT HEAD WITHOUT CONTRAST  TECHNIQUE: Contiguous axial images were obtained from the base of the skull through the vertex without intravenous contrast.  COMPARISON:  None.  FINDINGS: The  brainstem, cerebellum, cerebral peduncles, thalamus, basal ganglia, basilar cisterns, and ventricular system appear within normal limits. No intracranial hemorrhage, mass lesion, or acute CVA.  There is newly complete opacification of the visualized portion of the right maxillary sinus with opacification of multiple right-sided ethmoid air cells and complete opacification of the right frontal sinus.  IMPRESSION: 1. Complete opacification of the right frontal sinus with opacification of multiple right-sided ethmoid air cells and subtotal opacification the right maxillary sinus. This may reflect acute or chronic sinusitis. No obvious cortical breakthrough is identified. 2. No acute CVA or other acute intracranial findings are observed. These results were called by telephone at the time of interpretation on 06/19/2015 at 5:43 pm to Dr. Milton Ferguson , who verbally acknowledged these results.   Electronically Signed   By: Van Clines M.D.   On: 06/19/2015 17:43   Mr Jodene Nam Head Wo Contrast  06/20/2015   CLINICAL DATA:  Episode of altered mental status and weakness yesterday lasting for 1 hour.  EXAM: MRI HEAD WITHOUT CONTRAST  MRA HEAD WITHOUT CONTRAST  TECHNIQUE: Multiplanar, multiecho pulse sequences of the brain and surrounding structures were obtained without intravenous contrast. Angiographic images of the head were obtained using MRA technique without contrast.  COMPARISON:  Head CT  06/19/2015  FINDINGS: MRI HEAD FINDINGS  There is no evidence of acute infarct, intracranial hemorrhage, mass, midline shift, or extra-axial fluid collection. There is mild generalized cerebral atrophy. Scattered foci of T2 hyperintensity in the subcortical and deep cerebral white matter bilaterally are nonspecific but compatible with mild chronic small vessel ischemic disease.  Orbits are unremarkable. There is complete opacification of the right frontal sinus with subtotal opacification of the anterior right ethmoid air cells  and right maxillary sinus. A trace left mastoid effusion is noted. The distal left vertebral artery appears hypoplastic. Other major intracranial vascular flow voids are preserved. Visualized portion of the upper cervical spine demonstrates disc degeneration it C3-4 with focal kyphosis and mild impression on the spinal cord, incompletely evaluated.  MRA HEAD FINDINGS  The visualized distal right vertebral artery is patent and dominant. Decreased signal intensity in the proximal right V4 segment is likely technical, although underlying mild stenosis is possible. The visualized distal left vertebral artery appears hypoplastic and its V4 segment is not well seen, likely ending in PICA. Right PICA origin is not clearly identified. SCA origins are patent. Basilar artery is patent without stenosis.  There is a fetal origin of the right PCA. There is a patent left posterior communicating artery as well. The PCAs appears severely attenuated and are not well evaluated bilaterally, although this may be technical as the PCA flow voids appear more robust on the accompanying brain MRI.  The visualized distal cervical ICA on the left is tortuous. Intracranial ICAs are patent without evidence of significant stenosis. The left A1 segment is mildly hypoplastic. ACAs are otherwise unremarkable. M1 segments are patent without evidence of significant proximal stenosis. Distal M1 segments and proximal M2 segments are not well evaluated due to decreased signal which is felt to be largely if not completely artifactual. No intracranial aneurysm is identified.  IMPRESSION: 1. No acute intracranial abnormality. 2. Mild chronic small vessel ischemic disease. 3. Right-sided paranasal sinusitis. 4. No evidence of major intracranial arterial occlusion. Poor visualization of the PCAs and distal M1/proximal M2 segments of the MCAs, felt to be largely technical in nature.   Electronically Signed   By: Logan Bores   On: 06/20/2015 10:58   Mri  Brain Without Contrast  06/20/2015   CLINICAL DATA:  Episode of altered mental status and weakness yesterday lasting for 1 hour.  EXAM: MRI HEAD WITHOUT CONTRAST  MRA HEAD WITHOUT CONTRAST  TECHNIQUE: Multiplanar, multiecho pulse sequences of the brain and surrounding structures were obtained without intravenous contrast. Angiographic images of the head were obtained using MRA technique without contrast.  COMPARISON:  Head CT 06/19/2015  FINDINGS: MRI HEAD FINDINGS  There is no evidence of acute infarct, intracranial hemorrhage, mass, midline shift, or extra-axial fluid collection. There is mild generalized cerebral atrophy. Scattered foci of T2 hyperintensity in the subcortical and deep cerebral white matter bilaterally are nonspecific but compatible with mild chronic small vessel ischemic disease.  Orbits are unremarkable. There is complete opacification of the right frontal sinus with subtotal opacification of the anterior right ethmoid air cells and right maxillary sinus. A trace left mastoid effusion is noted. The distal left vertebral artery appears hypoplastic. Other major intracranial vascular flow voids are preserved. Visualized portion of the upper cervical spine demonstrates disc degeneration it C3-4 with focal kyphosis and mild impression on the spinal cord, incompletely evaluated.  MRA HEAD FINDINGS  The visualized distal right vertebral artery is patent and dominant. Decreased signal intensity in the proximal right V4  segment is likely technical, although underlying mild stenosis is possible. The visualized distal left vertebral artery appears hypoplastic and its V4 segment is not well seen, likely ending in PICA. Right PICA origin is not clearly identified. SCA origins are patent. Basilar artery is patent without stenosis.  There is a fetal origin of the right PCA. There is a patent left posterior communicating artery as well. The PCAs appears severely attenuated and are not well evaluated  bilaterally, although this may be technical as the PCA flow voids appear more robust on the accompanying brain MRI.  The visualized distal cervical ICA on the left is tortuous. Intracranial ICAs are patent without evidence of significant stenosis. The left A1 segment is mildly hypoplastic. ACAs are otherwise unremarkable. M1 segments are patent without evidence of significant proximal stenosis. Distal M1 segments and proximal M2 segments are not well evaluated due to decreased signal which is felt to be largely if not completely artifactual. No intracranial aneurysm is identified.  IMPRESSION: 1. No acute intracranial abnormality. 2. Mild chronic small vessel ischemic disease. 3. Right-sided paranasal sinusitis. 4. No evidence of major intracranial arterial occlusion. Poor visualization of the PCAs and distal M1/proximal M2 segments of the MCAs, felt to be largely technical in nature.   Electronically Signed   By: Logan Bores   On: 06/20/2015 10:58   US Carotid Bilateral  06/20/2015   CLINICAL DATA:  TIA symptoms  EXAM: BILATERAL CAROTID DUPLEX ULTRASOUND  TECHNIQUE: Pearline Cables scale imaging, color Doppler and duplex ultrasound were performed of bilateral carotid and vertebral arteries in the neck.  COMPARISON:  None.  FINDINGS: Criteria: Quantification of carotid stenosis is based on velocity parameters that correlate the residual internal carotid diameter with NASCET-based stenosis levels, using the diameter of the distal internal carotid lumen as the denominator for stenosis measurement.  The following velocity measurements were obtained:  RIGHT  ICA:  60/18 cm/sec  CCA:  06/26 cm/sec  SYSTOLIC ICA/CCA RATIO:  0.8  DIASTOLIC ICA/CCA RATIO:  1.5  ECA:  84 cm/sec  LEFT  ICA:  70/13 cm/sec  CCA:  948/54 cm/sec  SYSTOLIC ICA/CCA RATIO:  0.7  DIASTOLIC ICA/CCA RATIO:  1.0  ECA:  96 cm/sec  RIGHT CAROTID ARTERY: The grayscale images show mild intimal thickening without significant plaque formation. The waveforms,  velocities and flow velocity ratios demonstrate no evidence of focal hemodynamically significant stenosis.  RIGHT VERTEBRAL ARTERY:  Antegrade in nature.  LEFT CAROTID ARTERY: Grayscale images demonstrate no significant plaque formation. The waveforms, velocities and flow velocity ratios show no evidence of focal hemodynamically significant stenosis.  LEFT VERTEBRAL ARTERY:  Antegrade in nature.  IMPRESSION: No evidence of focal hemodynamically significant carotid stenosis.   Electronically Signed   By: Inez Catalina M.D.   On: 06/20/2015 10:10    Lab Results: Basic Metabolic Panel:  Recent Labs  06/19/15 1734 06/19/15 1739  NA 138 139  K 4.1 4.3  CL 103 103  CO2 25  --   GLUCOSE 257* 258*  BUN 17 17  CREATININE 1.04 1.00  CALCIUM 9.0  --    Liver Function Tests:  Recent Labs  06/19/15 1734  AST 29  ALT 20  ALKPHOS 77  BILITOT 0.9  PROT 8.2*  ALBUMIN 3.5     CBC:  Recent Labs  06/19/15 1734 06/19/15 1739  WBC 5.7  --   NEUTROABS 2.7  --   HGB 14.6 16.7  HCT 44.0 49.0  MCV 86.1  --   PLT 218  --  No results found for this or any previous visit (from the past 240 hour(s)).   Hospital Course: This is a 13 year old who I have not met in the past but I do see his family. He came to the emergency department because of an episode of passing out with food in his mouth. He came back around to normal mental status and was felt to have TIA. As part of his workup he had CT of the brain showed that he appeared to have an acute sinus infection then had MRI and MRA that showed some vascular disease but nothing acute carotid study that showed nothing acute echocardiogram is still pending. He was found to be diabetic and a have hypertension and hyperlipidemia during this admission.  Discharge Exam: Blood pressure 139/73, pulse 65, temperature 98.4 F (36.9 C), temperature source Oral, resp. rate 18, height '5\' 7"'  (1.702 m), weight 87.862 kg (193 lb 11.2 oz), SpO2 96 %. He is  awake and alert. His chest is clear. He is comfortable and his neurological examination is unremarkable now  Disposition: Home he will check his blood sugar once a day. He will take new medications as noted. He will follow-up in my office      Signed: Larae Caison L   06/21/2015, 9:30 AM

## 2015-06-22 LAB — HEMOGLOBIN A1C
Hgb A1c MFr Bld: 9.7 % — ABNORMAL HIGH (ref 4.8–5.6)
Hgb A1c MFr Bld: 9.7 % — ABNORMAL HIGH (ref 4.8–5.6)
MEAN PLASMA GLUCOSE: 232 mg/dL
MEAN PLASMA GLUCOSE: 232 mg/dL

## 2015-07-09 ENCOUNTER — Encounter (HOSPITAL_COMMUNITY): Payer: Self-pay | Admitting: *Deleted

## 2015-07-09 ENCOUNTER — Emergency Department (HOSPITAL_COMMUNITY): Payer: Managed Care, Other (non HMO)

## 2015-07-09 ENCOUNTER — Emergency Department (HOSPITAL_COMMUNITY)
Admission: EM | Admit: 2015-07-09 | Discharge: 2015-07-09 | Disposition: A | Discharge status: Home / Self Care | Payer: Managed Care, Other (non HMO) | Attending: Emergency Medicine | Admitting: Emergency Medicine

## 2015-07-09 DIAGNOSIS — Z79899 Other long term (current) drug therapy: Secondary | ICD-10-CM | POA: Diagnosis not present

## 2015-07-09 DIAGNOSIS — Y9302 Activity, running: Secondary | ICD-10-CM | POA: Insufficient documentation

## 2015-07-09 DIAGNOSIS — Y92093 Driveway of other non-institutional residence as the place of occurrence of the external cause: Secondary | ICD-10-CM | POA: Insufficient documentation

## 2015-07-09 DIAGNOSIS — W01198A Fall on same level from slipping, tripping and stumbling with subsequent striking against other object, initial encounter: Secondary | ICD-10-CM | POA: Diagnosis not present

## 2015-07-09 DIAGNOSIS — H409 Unspecified glaucoma: Secondary | ICD-10-CM | POA: Diagnosis not present

## 2015-07-09 DIAGNOSIS — S62101A Fracture of unspecified carpal bone, right wrist, initial encounter for closed fracture: Secondary | ICD-10-CM

## 2015-07-09 DIAGNOSIS — Y998 Other external cause status: Secondary | ICD-10-CM | POA: Insufficient documentation

## 2015-07-09 DIAGNOSIS — Z7982 Long term (current) use of aspirin: Secondary | ICD-10-CM | POA: Insufficient documentation

## 2015-07-09 DIAGNOSIS — S6991XA Unspecified injury of right wrist, hand and finger(s), initial encounter: Secondary | ICD-10-CM | POA: Diagnosis present

## 2015-07-09 DIAGNOSIS — S52591A Other fractures of lower end of right radius, initial encounter for closed fracture: Secondary | ICD-10-CM | POA: Diagnosis not present

## 2015-07-09 DIAGNOSIS — Z792 Long term (current) use of antibiotics: Secondary | ICD-10-CM | POA: Insufficient documentation

## 2015-07-09 MED ORDER — HYDROCODONE-ACETAMINOPHEN 5-325 MG PO TABS: 1.0000 | ORAL_TABLET | ORAL | Status: DC | PRN

## 2015-07-09 NOTE — ED Notes (Addendum)
Fell in driveway last night trying to get away from bees,  hitting R hand and wrist.  Denies hitting head or LOC.

## 2015-07-09 NOTE — Discharge Instructions (Signed)
Cast or Splint Care °Casts and splints support injured limbs and keep bones from moving while they heal. It is important to care for your cast or splint at home.   °HOME CARE INSTRUCTIONS °· Keep the cast or splint uncovered during the drying period. It can take 24 to 48 hours to dry if it is made of plaster. A fiberglass cast will dry in less than 1 hour. °· Do not rest the cast on anything harder than a pillow for the first 24 hours. °· Do not put weight on your injured limb or apply pressure to the cast until your health care provider gives you permission. °· Keep the cast or splint dry. Wet casts or splints can lose their shape and may not support the limb as well. A wet cast that has lost its shape can also create harmful pressure on your skin when it dries. Also, wet skin can become infected. °· Cover the cast or splint with a plastic bag when bathing or when out in the rain or snow. If the cast is on the trunk of the body, take sponge baths until the cast is removed. °· If your cast does become wet, dry it with a towel or a blow dryer on the cool setting only. °· Keep your cast or splint clean. Soiled casts may be wiped with a moistened cloth. °· Do not place any hard or soft foreign objects under your cast or splint, such as cotton, toilet paper, lotion, or powder. °· Do not try to scratch the skin under the cast with any object. The object could get stuck inside the cast. Also, scratching could lead to an infection. If itching is a problem, use a blow dryer on a cool setting to relieve discomfort. °· Do not trim or cut your cast or remove padding from inside of it. °· Exercise all joints next to the injury that are not immobilized by the cast or splint. For example, if you have a long leg cast, exercise the hip joint and toes. If you have an arm cast or splint, exercise the shoulder, elbow, thumb, and fingers. °· Elevate your injured arm or leg on 1 or 2 pillows for the first 1 to 3 days to decrease  swelling and pain. It is best if you can comfortably elevate your cast so it is higher than your heart. °SEEK MEDICAL CARE IF:  °· Your cast or splint cracks. °· Your cast or splint is too tight or too loose. °· You have unbearable itching inside the cast. °· Your cast becomes wet or develops a soft spot or area. °· You have a bad smell coming from inside your cast. °· You get an object stuck under your cast. °· Your skin around the cast becomes red or raw. °· You have new pain or worsening pain after the cast has been applied. °SEEK IMMEDIATE MEDICAL CARE IF:  °· You have fluid leaking through the cast. °· You are unable to move your fingers or toes. °· You have discolored (blue or white), cool, painful, or very swollen fingers or toes beyond the cast. °· You have tingling or numbness around the injured area. °· You have severe pain or pressure under the cast. °· You have any difficulty with your breathing or have shortness of breath. °· You have chest pain. °Document Released: 12/09/2000 Document Revised: 10/02/2013 Document Reviewed: 06/20/2013 °Exit[employer]® Patient Information ©2015 [employer]. This information is not intended to replace advice given to you by your health care   provider. Make sure you discuss any questions you have with your health care provider. ° °Wrist Fracture °A wrist fracture is a break or crack in one of the bones of your wrist. Your wrist is made up of eight small bones at the palm of your hand (carpal bones) and two long bones that make up your forearm (radius and ulna).  °CAUSES  °· A direct blow to the wrist. °· Falling on an outstretched hand. °· Trauma, such as a car accident or a fall. °RISK FACTORS °Risk factors for wrist fracture include:  °· Participating in contact and high-risk sports, such as skiing, biking, and ice skating. °· Taking steroid medicines. °· Smoking. °· Being male. °· Being Caucasian. °· Drinking more than three alcoholic beverages per day. °· Having low or  lowered bone density (osteoporosis or osteopenia). °· Age. Older adults have decreased bone density. °· Women who have had menopause. °· History of previous fractures. °SIGNS AND SYMPTOMS °Symptoms of wrist fractures include tenderness, bruising, and inflammation. Additionally, the wrist may hang in an odd position or appear deformed.  °DIAGNOSIS °Diagnosis may include: °· Physical exam. °· X-ray. °TREATMENT °Treatment depends on many factors, including the nature and location of the fracture, your age, and your activity level. Treatment for wrist fracture can be nonsurgical or surgical.  °Nonsurgical Treatment °A plaster cast or splint may be applied to your wrist if the bone is in a good position. If the fracture is not in good position, it may be necessary for your health care provider to realign it before applying a splint or cast. Usually, a cast or splint will be worn for several weeks.  °Surgical Treatment °Sometimes the position of the bone is so far out of place that surgery is required to apply a device to hold it together as it heals. Depending on the fracture, there are a number of options for holding the bone in place while it heals, such as a cast and metal pins.   °HOME CARE INSTRUCTIONS °· Keep your injured wrist elevated and move your fingers as much as possible. °· Do not put pressure on any part of your cast or splint. It may break.   °· Use a plastic bag to protect your cast or splint from water while bathing or showering. Do not lower your cast or splint into water. °· Take medicines only as directed by your health care provider. °· Keep your cast or splint clean and dry. If it becomes wet, damaged, or suddenly feels too tight, contact your health care provider right away. °· Do not use any tobacco products including cigarettes, chewing tobacco, or electronic cigarettes. Tobacco can delay bone healing. If you need help quitting, ask your health care provider. °· Keep all follow-up visits as  directed by your health care provider. This is important. °· Ask your health care provider if you should take supplements of calcium and vitamins C and D to promote bone healing. °SEEK MEDICAL CARE IF:  °· Your cast or splint is damaged, breaks, or gets wet. °· You have a fever. °· You have chills. °· You have continued severe pain or more swelling than you did before the cast was put on. °SEEK IMMEDIATE MEDICAL CARE IF:  °· Your hand or fingernails on the injured arm turn blue or gray, or feel cold or numb. °· You have decreased feeling in the fingers of your injured arm. °MAKE SURE YOU: °· Understand these instructions. °· Will watch your condition. °· Will get help   right away if you are not doing well or get worse. °Document Released: 09/21/2005 Document Revised: 04/28/2014 Document Reviewed: 12/30/2011 °ExitCare® Patient Information ©2015 [employer]. This information is not intended to replace advice given to you by your health care provider. Make sure you discuss any questions you have with your health care provider. ° °

## 2015-07-09 NOTE — ED Provider Notes (Signed)
CSN: [id number]     Arrival date & time 07/09/15  1449 History   None    Chief Complaint  Patient presents with  . Wrist Injury     (Consider location/radiation/quality/duration/timing/severity/associated sxs/prior Treatment) HPI Jon Brooks is a 74 y.o. male who presents to the ED with right wrist pain and swelling. He reports that he was running last night in the driveway trying to get away from bees and fell on his right wrist. He denies any other injuries.   Past Medical History  Diagnosis Date  . Glaucoma    Past Surgical History  Procedure Laterality Date  . Cyst on left shoulder    . Colonoscopy N/A 05/16/2013    Procedure: COLONOSCOPY;  Surgeon: Daneil Dolin, MD;  Location: [facility];  Service: Endoscopy;  Laterality: N/A;  9:30   Family History  Problem Relation Age of Onset  . Colon cancer Neg Hx   . Liver disease Father     NASH? age 38   History  Substance Use Topics  . Smoking status: Never Smoker   . Smokeless tobacco: Not on file  . Alcohol Use: No    Review of Systems Negative except as stated in HPI   Allergies  Review of patient's allergies indicates no known allergies.  Home Medications   Prior to Admission medications   Medication Sig Start Date End Date Taking? Authorizing Provider   stroke: mapping our early stages of recovery book MISC 1 each by Does not apply route once. 06/21/15   Sinda Du, MD  aspirin 325 MG tablet Take 1 tablet (325 mg total) by mouth daily. 06/21/15   Sinda Du, MD  atorvastatin (LIPITOR) 80 MG tablet Take 1 tablet (80 mg total) by mouth daily at 6 PM. 06/21/15   Sinda Du, MD  AZOPT 1 % ophthalmic suspension Place 1 drop into both eyes 2 (two) times daily.  03/15/13   Historical Provider, MD  cefUROXime (CEFTIN) 500 MG tablet Take 1 tablet (500 mg total) by mouth 2 (two) times daily with a meal. 06/21/15   Sinda Du, MD  HYDROcodone-acetaminophen (NORCO) 5-325 MG per tablet Take 1 tablet by mouth  every 4 (four) hours as needed for moderate pain. 07/09/15   [place], NP  lisinopril (PRINIVIL,ZESTRIL) 2.5 MG tablet Take 1 tablet (2.5 mg total) by mouth daily. 06/21/15   Sinda Du, MD  metFORMIN (GLUCOPHAGE) 500 MG tablet Take 1 tablet (500 mg total) by mouth 2 (two) times daily with a meal. 06/21/15   Sinda Du, MD  TRAVATAN Z 0.004 % SOLN ophthalmic solution Place 1 drop into both eyes at bedtime.  04/17/13   Historical Provider, MD   BP 161/75 mmHg  Pulse 88  Temp(Src) 99.5 F (37.5 C) (Oral)  Resp 18  Ht 5\' 7"  (1.702 m)  Wt 197 lb (89.359 kg)  BMI 30.85 kg/m2  SpO2 95% Physical Exam  Constitutional: He is oriented to person, place, and time. He appears well-developed and well-nourished.  HENT:  Head: Normocephalic and atraumatic.  Eyes: Conjunctivae and EOM are normal.  Neck: Neck supple.  Cardiovascular: Normal rate.   Pulmonary/Chest: Effort normal.  Musculoskeletal:       Right wrist: He exhibits tenderness and swelling. He exhibits no laceration. Decreased range of motion: due to pain and swelling.  Radial pulse 2+, adequate circulation, good touch sensation.   Neurological: He is alert and oriented to person, place, and time. No cranial nerve deficit.  Skin: Skin is  warm and dry.  Psychiatric: He has a normal mood and affect. His behavior is normal.  Nursing note and vitals reviewed.   ED Course  Procedures (including critical care time) Dr. Roderic Palau in to examine the patient and discuss results of x-ray and need for follow up.  Wrist splint, ice, elevate, pain management.   Labs Review Labs Reviewed - No data to display  Imaging Review Dg Wrist Complete Right  07/09/2015   CLINICAL DATA:  Fall while running on outstretched hand with wrist pain  EXAM: RIGHT WRIST - COMPLETE 3+ VIEW  COMPARISON:  None.  FINDINGS: There is a comminuted fracture of the distal right radius with extension into the radiocarpal articulation mild impaction is noted at the  fracture site. No significant angulation is seen. Distal ulna and carpal bones are within normal limits.  IMPRESSION: Comminuted distal radial fracture involving the radiocarpal joint. No other focal abnormality is seen.   Electronically Signed   By: Inez Catalina M.D.   On: 07/09/2015 15:43     MDM  74 y.o. male with pain and swelling of the right wrist s/p fall last night. Stable for d/c without neurovascular compromise. Discussed with the patient clinical and x-ray findings and plan of care. All questioned fully answered. He will call Dr. Ruthe Mannan office for follow up or return here if any problems arise.  Final diagnoses:  Wrist fracture, right, closed, initial encounter       [facility], NP 07/09/15 1706  Milton Ferguson, MD 07/13/15 1352

## 2015-07-14 ENCOUNTER — Encounter: Payer: Self-pay | Admitting: Orthopedic Surgery

## 2015-07-14 ENCOUNTER — Ambulatory Visit (INDEPENDENT_AMBULATORY_CARE_PROVIDER_SITE_OTHER): Payer: Managed Care, Other (non HMO) | Admitting: Orthopedic Surgery

## 2015-07-14 VITALS — BP 149/86 | Ht 67.0 in | Wt 197.0 lb

## 2015-07-14 DIAGNOSIS — S52531A Colles' fracture of right radius, initial encounter for closed fracture: Secondary | ICD-10-CM | POA: Diagnosis not present

## 2015-07-14 NOTE — Progress Notes (Signed)
Patient ID: Jon Brooks, male   DOB: 1941-01-31, 74 y.o.   MRN: [id number]   Chief Complaint  Patient presents with  . Wrist Injury    er follow up right wrist fracture, DOI 07/07/14    Jon Brooks is a 74 y.o. male.   HPI 74 year old male fell on the 13th running from a red wasp nest landed on his outstretched hand when he fell backwards injuring his right hand complains of mild discomfort mild swelling mild pain with range of motion. The x-rays were done at the hospital they show a nondisplaced intra-articular fracture of the distal radius with a neutral volar tilt and minimal if any shortening radial ulnar.  Review of systems is otherwise normal he does have a skin abrasion over the elbow area just distal to it over the forearm.   Review of Systems See hpi  Past Medical History  Diagnosis Date  . Glaucoma     No Known Allergies  Current Outpatient Prescriptions  Medication Sig Dispense Refill  . aspirin 325 MG tablet Take 1 tablet (325 mg total) by mouth daily. 30 tablet 5  . atorvastatin (LIPITOR) 80 MG tablet Take 1 tablet (80 mg total) by mouth daily at 6 PM. 30 tablet 12  . cefUROXime (CEFTIN) 500 MG tablet Take 1 tablet (500 mg total) by mouth 2 (two) times daily with a meal. 14 tablet 0  . lisinopril (PRINIVIL,ZESTRIL) 2.5 MG tablet Take 1 tablet (2.5 mg total) by mouth daily. 30 tablet 12  . metFORMIN (GLUCOPHAGE) 500 MG tablet Take 1 tablet (500 mg total) by mouth 2 (two) times daily with a meal. 60 tablet 5  .  stroke: mapping our early stages of recovery book MISC 1 each by Does not apply route once. 1 each 0  . AZOPT 1 % ophthalmic suspension Place 1 drop into both eyes 2 (two) times daily.     [name] HYDROcodone-acetaminophen (NORCO) 5-325 MG per tablet Take 1 tablet by mouth every 4 (four) hours as needed for moderate pain. 30 tablet 0  . TRAVATAN Z 0.004 % SOLN ophthalmic solution Place 1 drop into both eyes at bedtime.      No current facility-administered medications  for this visit.     Physical Exam Blood pressure 149/86, height 5\' 7"  (1.702 m), weight 197 lb (89.359 kg). Physical Exam  The patient is well developed well nourished and well groomed.   Orientation to person place and time is normal   Mood is pleasant.  Ambulatory status normal  Inspection: Of the right wrist shows tenderness over the distal radius and swelling  ROM: There is a 30-50% loss of flexion extension and pronation supination  Stability: No instability on the shuck test but it was painful  Motor exam: All the fingers are moving normally enhances the wrist elbow and shoulder  Skin: Abrasion proximal forearm near the elbow  Neuro: Normal  Vascular: Radial pulse 2+ ulnar pulse 2+ perfusion normal  Lymph: Lymph nodes in the epitrochlear region are normal   Data Reviewed Order xrays none today Read xrays  I reviewed the x-rays as a nondisplaced intra-the distal radius fracture is noted in history of present illness Reports reviewed: Hospital radiology report  Diagnosis New with further work up no further workup New without further work up yes  Encounter Diagnosis  Name Primary?  . Colles' fracture of right radius, closed, initial encounter Yes      Management Application short arm cast In 6 weeks return to have  the cast removed and x-rays taken out of the plaster Out of work 8 weeks

## 2015-07-14 NOTE — Patient Instructions (Signed)
Out of work 8 weeks

## 2015-08-25 ENCOUNTER — Ambulatory Visit (INDEPENDENT_AMBULATORY_CARE_PROVIDER_SITE_OTHER): Payer: Self-pay | Admitting: Orthopedic Surgery

## 2015-08-25 ENCOUNTER — Ambulatory Visit (INDEPENDENT_AMBULATORY_CARE_PROVIDER_SITE_OTHER): Payer: Managed Care, Other (non HMO)

## 2015-08-25 ENCOUNTER — Encounter: Payer: Self-pay | Admitting: Orthopedic Surgery

## 2015-08-25 VITALS — BP 151/83 | Ht 67.0 in | Wt 202.0 lb

## 2015-08-25 DIAGNOSIS — S62101D Fracture of unspecified carpal bone, right wrist, subsequent encounter for fracture with routine healing: Secondary | ICD-10-CM

## 2015-08-25 NOTE — Progress Notes (Signed)
Patient ID: Jon Brooks, male   DOB: [birth year], 74 y.o.   MRN: [id number]  Follow up visit  Chief Complaint  Patient presents with  . Follow-up    6 week follow up + xray right wrist fx oop, DOI 07/08/15    BP 151/83 mmHg  Ht 5\' 7"  (1.702 m)  Wt 202 lb (91.627 kg)  BMI 31.63 kg/m2  Encounter Diagnosis  Name Primary?  . Right wrist fracture, with routine healing, subsequent encounter Yes    The patient is doing well scheduled to go back to work on September 12  Will put him light duty for 2 weeks with 5 pound lifting restriction  X-rays today show excellent fracture alignment and healing  Replacement a volar-based Velcro splint

## 2015-08-25 NOTE — Patient Instructions (Signed)
Return to work 9/12   2 weeks light duty  Brace as needed

## 2015-09-07 ENCOUNTER — Telehealth: Payer: Self-pay | Admitting: Orthopedic Surgery

## 2015-09-07 NOTE — Telephone Encounter (Signed)
yes

## 2015-09-07 NOTE — Telephone Encounter (Signed)
Patient stopped into office today, 09/07/15, to relay that his employer, [employer], does not have light duty work available for him, per the work note issued at his most recent visit.  Note reads:  "Cleared to Return to work, Light duty, 09/07/15  - continue light duty for 2 weeks, through 09/19/15 Cleared to Return to Full duty work: 09/20/15, no restrictions"   - His employer also called to follow up, and has sent a copy of his job description, which requires lifting of 25 lbs or over, for full duty work.  Complete description is in Dr Ruthe Mannan box for review.  Is patient is to remain out of work for now, and return 09/20/15 full duty? He is to follow up as needed.  Patient ph# [phone]

## 2015-09-08 ENCOUNTER — Encounter: Payer: Self-pay | Admitting: Orthopedic Surgery

## 2015-09-08 NOTE — Telephone Encounter (Signed)
Called back to patient to relay work status per Dr Ruthe Mannan response.  Updated note provided.

## 2016-04-27 IMAGING — US US CAROTID DUPLEX BILAT
1 series · 13 of 24 positions shown · non-contrast
Comparison: None.

CLINICAL DATA: TIA symptoms

EXAM:
BILATERAL CAROTID DUPLEX ULTRASOUND
TECHNIQUE: Gray scale imaging, color Doppler and duplex ultrasound were
performed of bilateral carotid and vertebral arteries in the neck.

[Series 1: us carotid duplex bilat · 0.05mm/px · 13 of 65 slices shown]
[im 1/65]
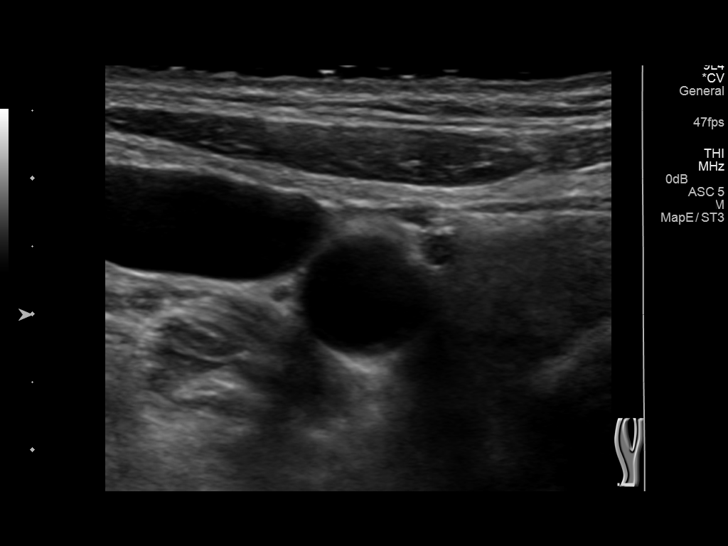
[im 6/65]
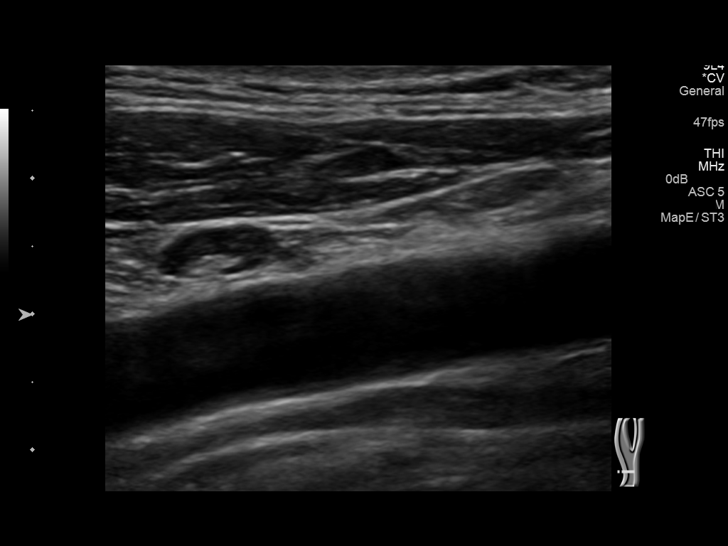
[im 12/65]
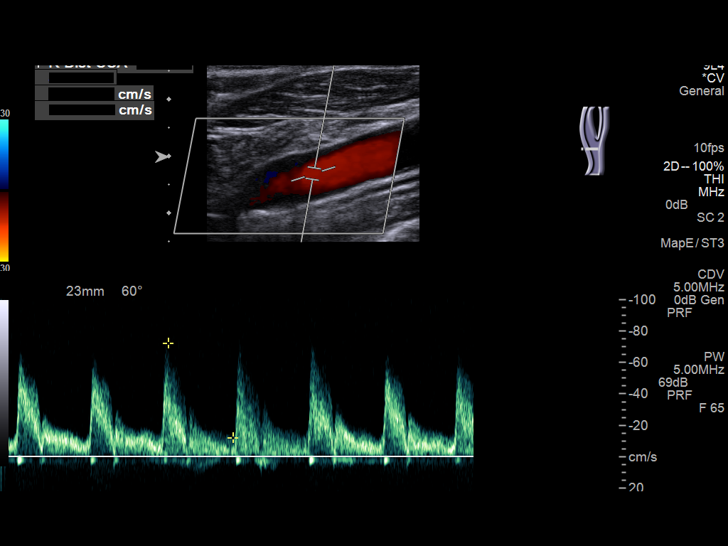
[im 17/65]
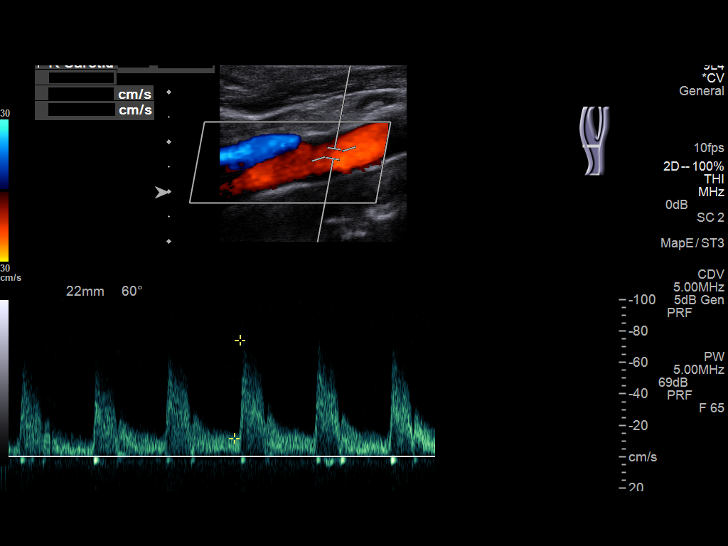
[im 23/65]
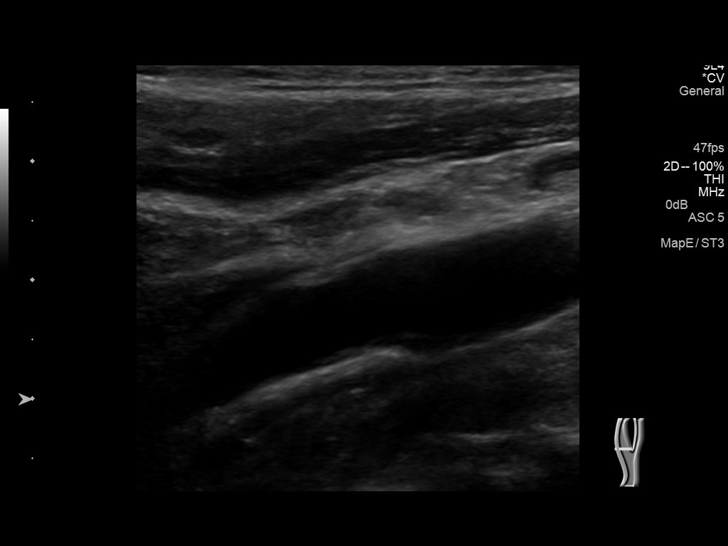
[im 28/65]
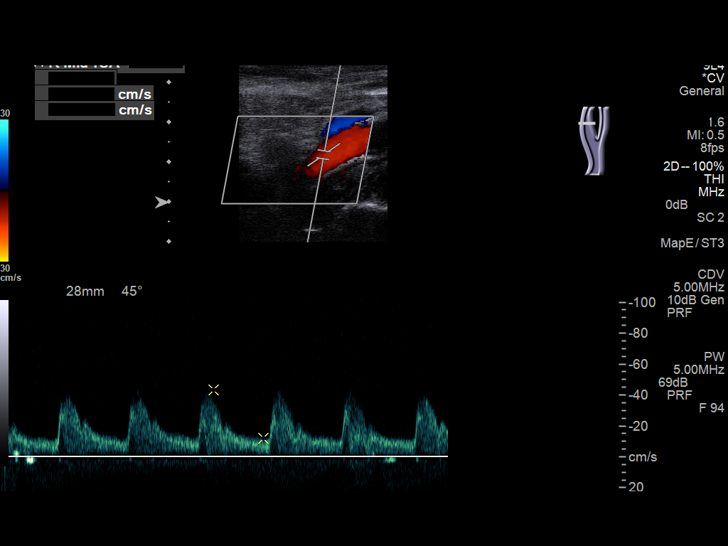
[im 34/65]
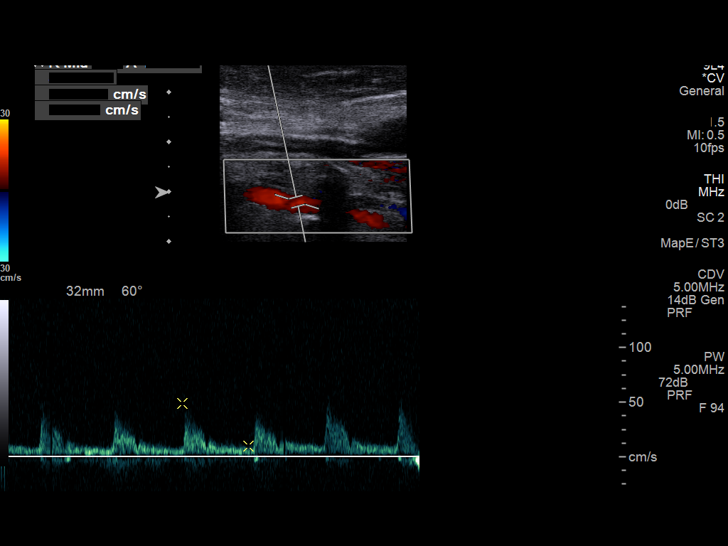
[im 37/65]
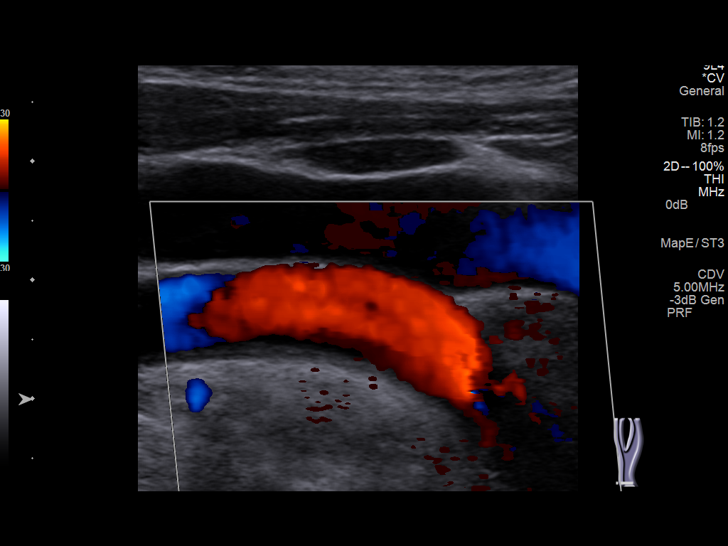
[im 42/65]
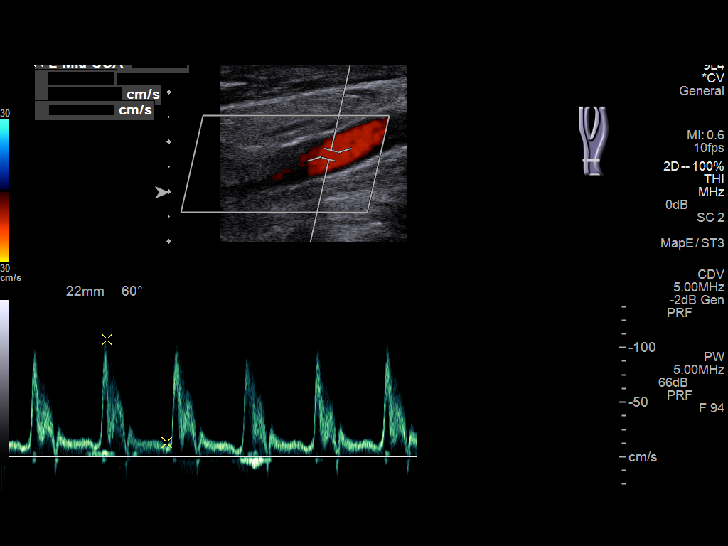
[im 48/65]
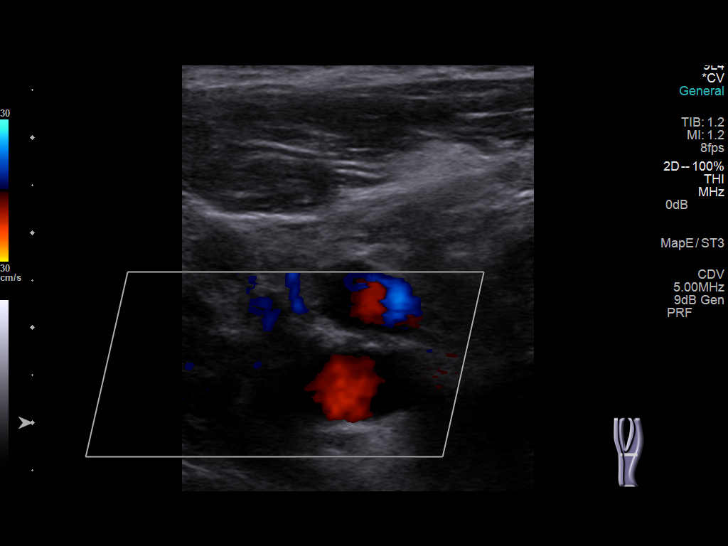
[im 53/65]
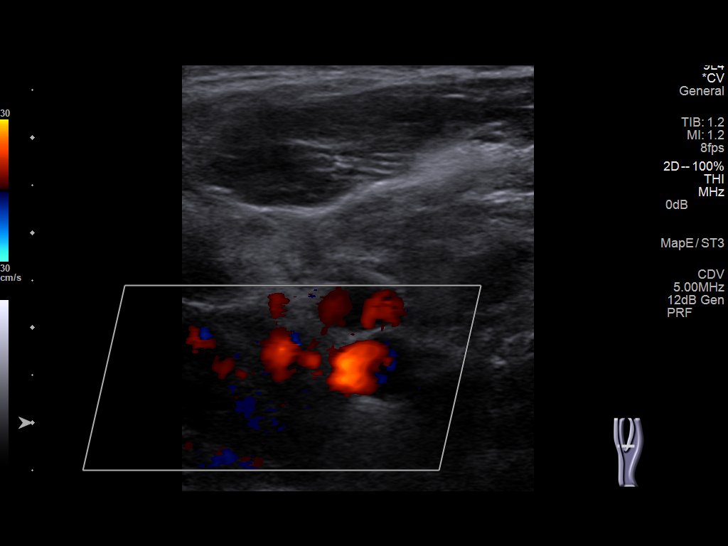
[im 59/65]
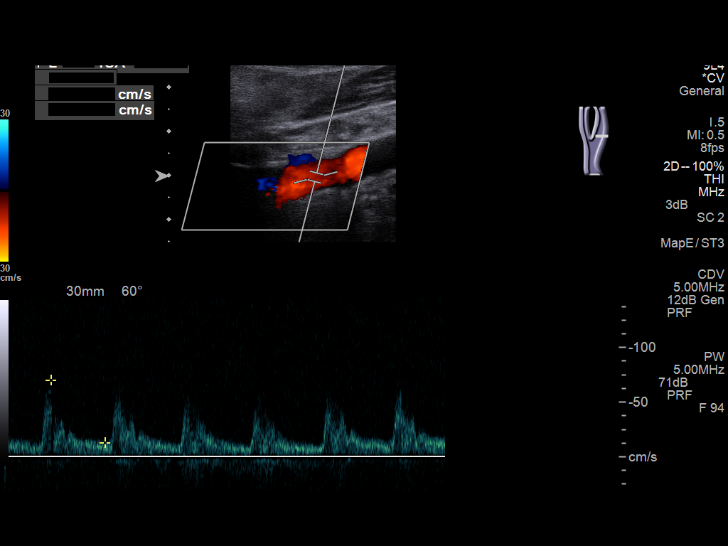
[im 65/65]
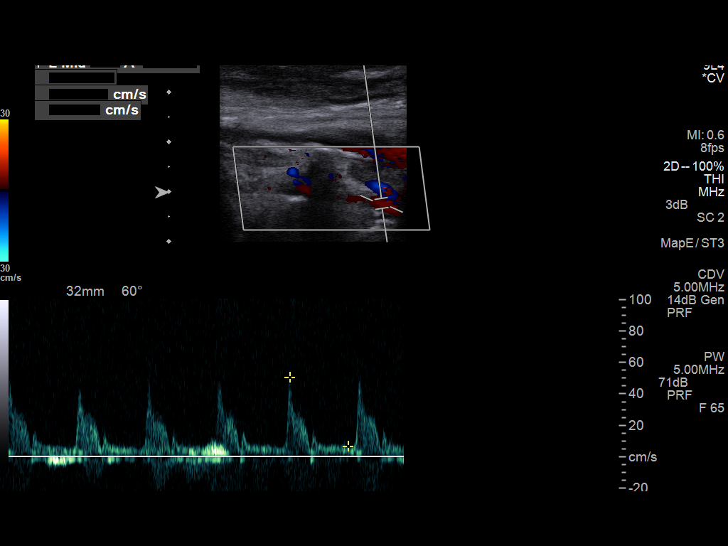

[13 of 24 positions shown; findings below may reference images not displayed]

FINDINGS: Criteria: Quantification of carotid stenosis is based on velocity
parameters that correlate the residual internal carotid diameter
with NASCET-based stenosis levels, using the diameter of the distal
internal carotid lumen as the denominator for stenosis measurement.

The following velocity measurements were obtained:

RIGHT

ICA:  60/18 cm/sec

CCA:  76/12 cm/sec

SYSTOLIC ICA/CCA RATIO:

DIASTOLIC ICA/CCA RATIO:

ECA:  84 cm/sec

LEFT

ICA:  70/13 cm/sec

CCA:  107/13 cm/sec

SYSTOLIC ICA/CCA RATIO:

DIASTOLIC ICA/CCA RATIO:

ECA:  96 cm/sec

RIGHT CAROTID ARTERY: The grayscale images show mild intimal
thickening without significant plaque formation. The waveforms,
velocities and flow velocity ratios demonstrate no evidence of focal
hemodynamically significant stenosis.

RIGHT VERTEBRAL ARTERY:  Antegrade in nature.

LEFT CAROTID ARTERY: Grayscale images demonstrate no significant
plaque formation. The waveforms, velocities and flow velocity ratios
show no evidence of focal hemodynamically significant stenosis.

LEFT VERTEBRAL ARTERY:  Antegrade in nature.
IMPRESSION: No evidence of focal hemodynamically significant carotid stenosis.

## 2016-06-20 IMAGING — CT CT HEAD W/O CM
1 series · 15 of 30 positions shown, 19 images · non-contrast
Comparison: None.

CLINICAL DATA: Left-sided weakness and slurred speech starting 1
hour ago.

EXAM:
CT HEAD WITHOUT CONTRAST
TECHNIQUE: Contiguous axial images were obtained from the base of the skull
through the vertex without intravenous contrast.

[Series 2: headseq 4.8 h37s · axial · 0.56mm/px · z∈[+129,+283]mm · 15 of 36 slices shown, 19 images]
[im 2/36  brain]
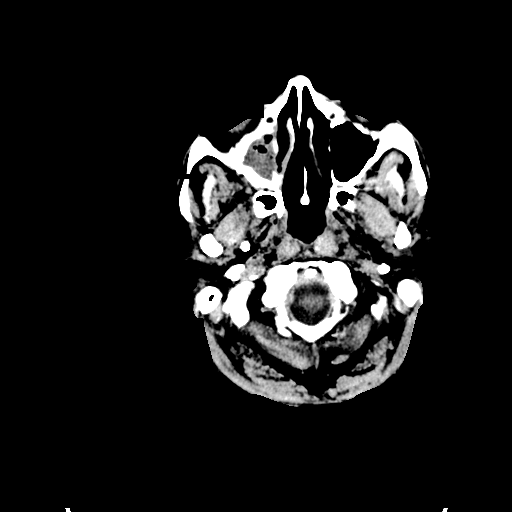
[im 2/36  bone]
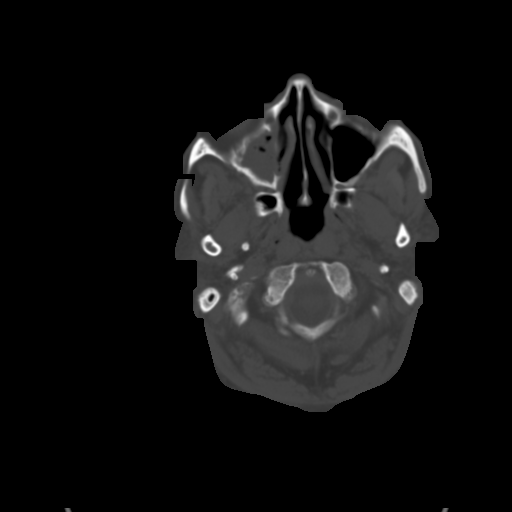
[im 4/36  brain]
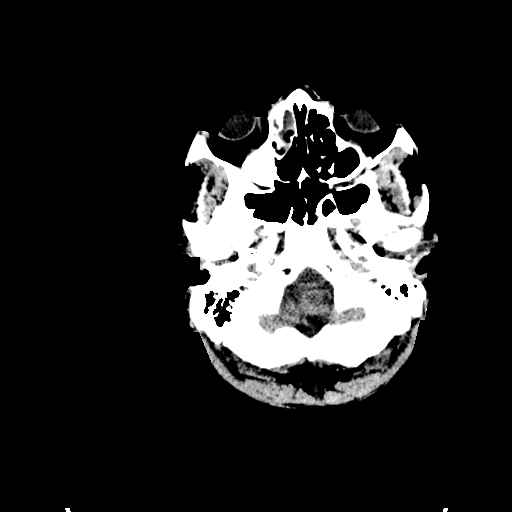
[im 7/36  brain]
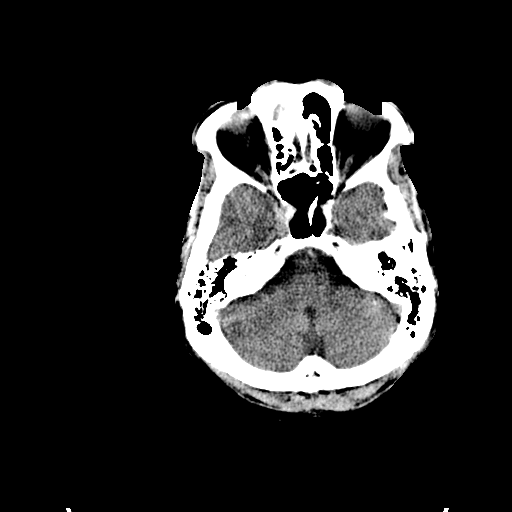
[im 9/36  brain]
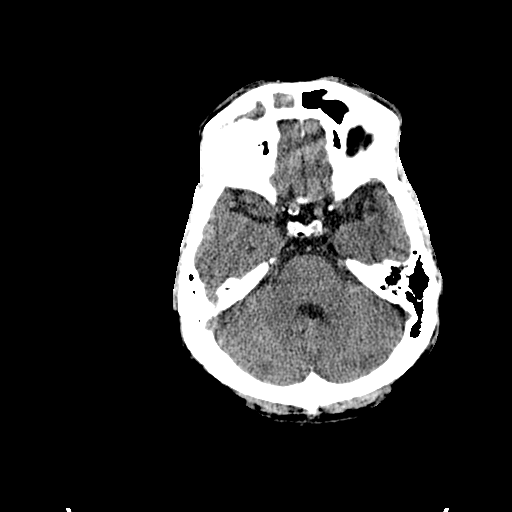
[im 11/36  brain]
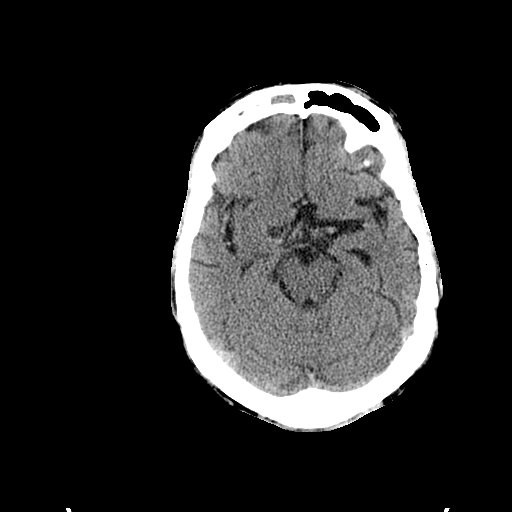
[im 11/36  bone]
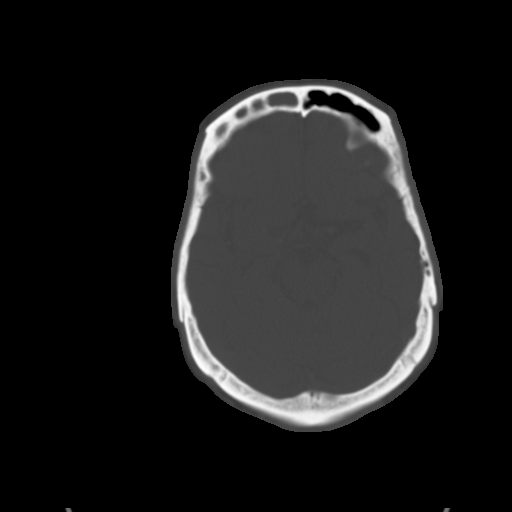
[im 14/36  brain]
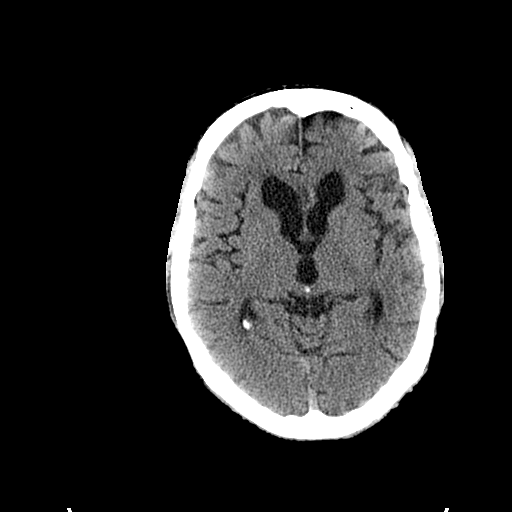
[im 16/36  brain]
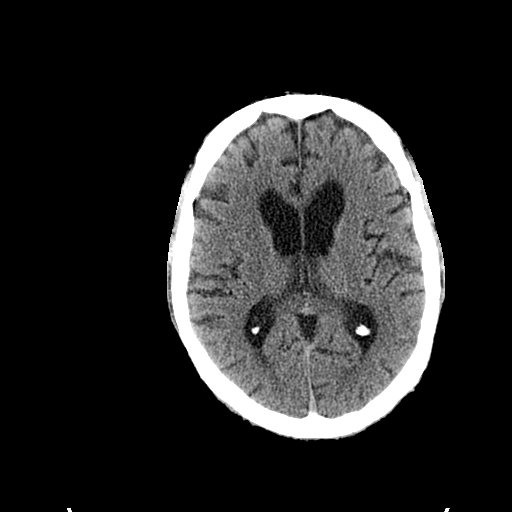
[im 19/36  brain]
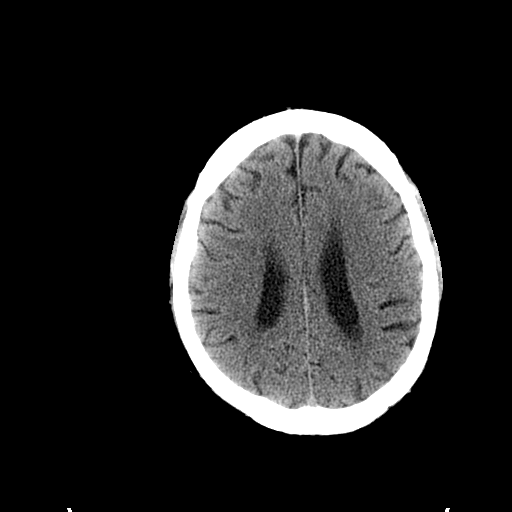
[im 20/36  brain]
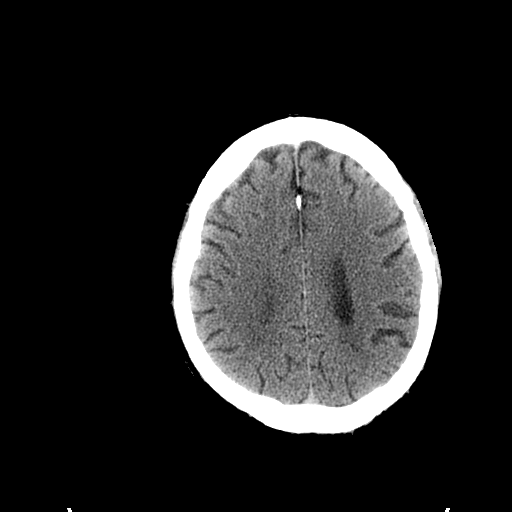
[im 20/36  bone]
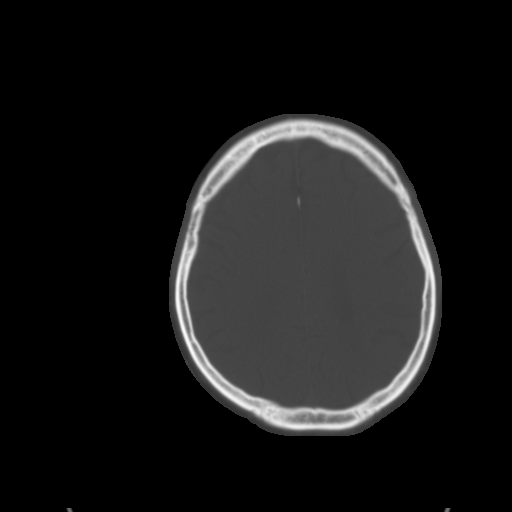
[im 22/36  brain]
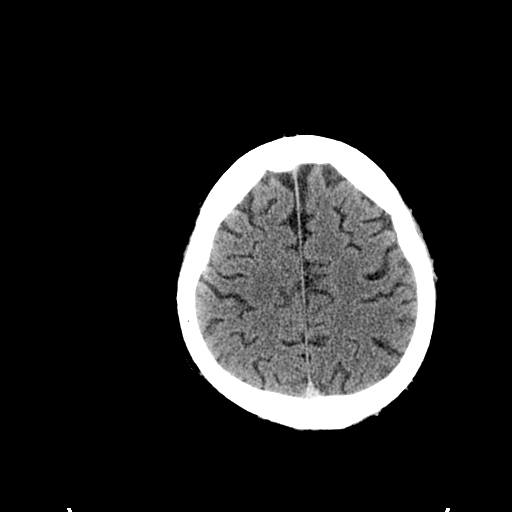
[im 25/36  brain]
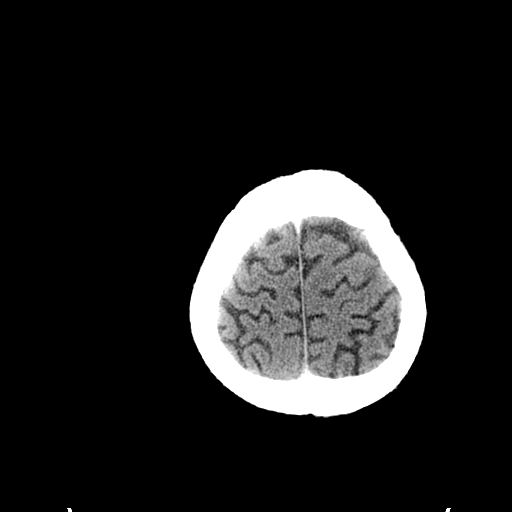
[im 27/36  brain]
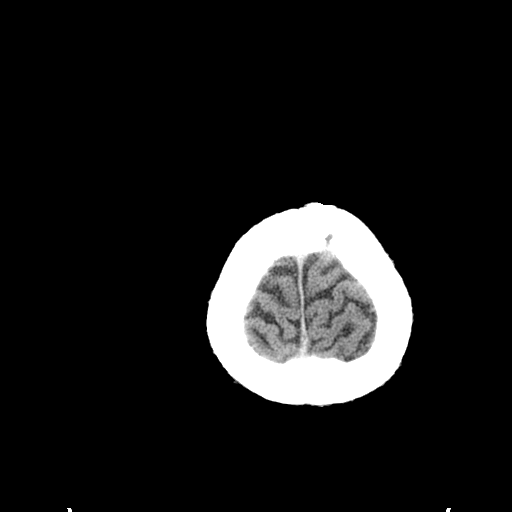
[im 29/36  brain]
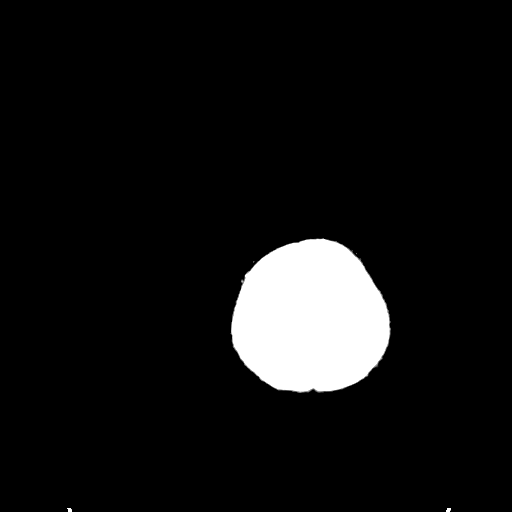
[im 29/36  bone]
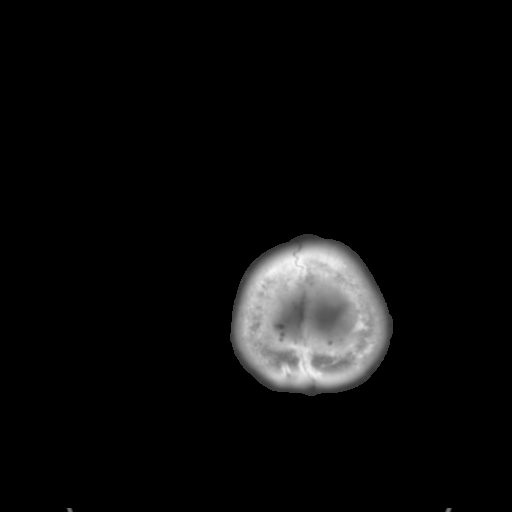
[im 32/36  brain]
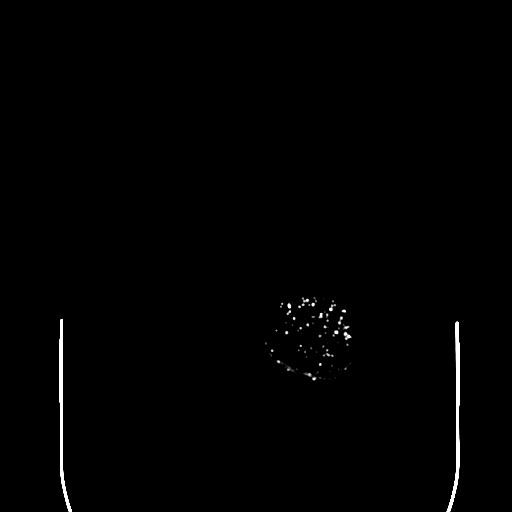
[im 34/36  brain]
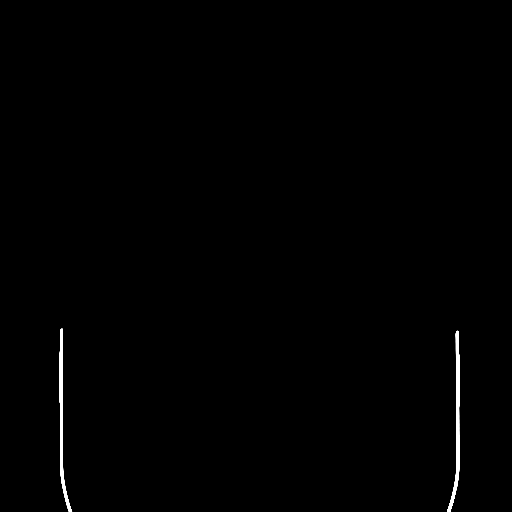

[15 of 30 positions shown; findings below may reference images not displayed]

FINDINGS: The brainstem, cerebellum, cerebral peduncles, thalamus, basal
ganglia, basilar cisterns, and ventricular system appear within
normal limits. No intracranial hemorrhage, mass lesion, or acute
CVA.

There is newly complete opacification of the visualized portion of
the right maxillary sinus with opacification of multiple right-sided
ethmoid air cells and complete opacification of the right frontal
sinus.
IMPRESSION: 1. Complete opacification of the right frontal sinus with
opacification of multiple right-sided ethmoid air cells and subtotal
opacification the right maxillary sinus. This may reflect acute or
chronic sinusitis. No obvious cortical breakthrough is identified.
2. No acute CVA or other acute intracranial findings are observed.
These results were called by telephone at the time of interpretation
on 06/19/2015 at [DATE] to Dr. AVIN OLAVARRIA , who verbally
acknowledged these results.

## 2016-10-05 ENCOUNTER — Ambulatory Visit (INDEPENDENT_AMBULATORY_CARE_PROVIDER_SITE_OTHER): Payer: Medicare Other | Admitting: Urology

## 2016-10-05 ENCOUNTER — Other Ambulatory Visit: Payer: Self-pay | Admitting: Urology

## 2016-10-05 DIAGNOSIS — R972 Elevated prostate specific antigen [PSA]: Secondary | ICD-10-CM | POA: Diagnosis not present

## 2016-11-02 ENCOUNTER — Ambulatory Visit (HOSPITAL_COMMUNITY)
Admission: RE | Admit: 2016-11-02 | Discharge: 2016-11-02 | Disposition: A | Discharge status: Home / Self Care | Payer: Medicare Other | Source: Ambulatory Visit | Attending: Urology | Admitting: Urology

## 2016-11-02 DIAGNOSIS — C61 Malignant neoplasm of prostate: Secondary | ICD-10-CM | POA: Diagnosis not present

## 2016-11-02 DIAGNOSIS — R972 Elevated prostate specific antigen [PSA]: Secondary | ICD-10-CM | POA: Diagnosis present

## 2016-11-02 MED ORDER — LIDOCAINE HCL (PF) 2 % IJ SOLN
10.0000 mL | Freq: Once | INTRAMUSCULAR | Status: AC
  Administered 2016-11-02: 10 mL

## 2016-11-02 MED ORDER — GENTAMICIN SULFATE 40 MG/ML IJ SOLN
80.0000 mg | Freq: Once | INTRAMUSCULAR | Status: AC
  Administered 2016-11-02: 80 mg via INTRAMUSCULAR

## 2016-11-02 MED ORDER — LIDOCAINE HCL (PF) 2 % IJ SOLN
INTRAMUSCULAR | Status: AC
  Administered 2016-11-02: 10 mL
  Filled 2016-11-02: qty 10

## 2016-11-02 MED ORDER — GENTAMICIN SULFATE 40 MG/ML IJ SOLN
INTRAMUSCULAR | Status: AC
  Filled 2016-11-02: qty 2

## 2016-11-02 NOTE — Discharge Instructions (Signed)
Transrectal Ultrasound-Guided Biopsy °A transrectal ultrasound-guided biopsy is a procedure to take samples of tissue from your prostate. Ultrasound images are used to guide the procedure. It is usually done to check the prostate gland for cancer. °BEFORE THE PROCEDURE °· Do not eat or drink after midnight on the night before your procedure. °· Take medicines as your doctor tells you. °· Your doctor may have you stop taking some medicines 5-7 days before the procedure. °· You will be given an enema before your procedure. During an enema, a liquid is put into your butt (rectum) to clear out waste. °· You may have lab tests the day of your procedure. °· Make plans to have someone drive you home. °PROCEDURE °· You will be given medicine to help you relax before the procedure. An IV tube will be put into one of your veins. It will be used to give fluids and medicine. °· You will be given medicine to reduce the risk of infection (antibiotic). °· You will be placed on your side. °· A probe with gel will be put in your butt. This is used to take pictures of your prostate and the area around it. °· A medicine to numb the area is put into your prostate. °· A biopsy needle is then inserted and guided to your prostate. °· Samples of prostate tissue are taken. The needle is removed. °· The samples are sent to a lab to be checked. Results are usually back in 2-3 days. °AFTER THE PROCEDURE °· You will be taken to a room where you will be watched until you are doing okay. °· You may have some pain in the area around your butt. You will be given medicines for this. °· You may be able to go home the same day. Sometimes, an overnight stay in the hospital is needed. °  °This information is not intended to replace advice given to you by your health care provider. Make sure you discuss any questions you have with your health care provider. °  °Document Released: 11/30/2009 Document Revised: 12/17/2013 Document Reviewed:  07/31/2013 °Elsevier Interactive Patient Education ©2016 [employer] ° °

## 2016-11-09 ENCOUNTER — Other Ambulatory Visit: Payer: Self-pay | Admitting: Urology

## 2016-11-09 ENCOUNTER — Ambulatory Visit (INDEPENDENT_AMBULATORY_CARE_PROVIDER_SITE_OTHER): Payer: Medicare Other | Admitting: Urology

## 2016-11-09 DIAGNOSIS — C61 Malignant neoplasm of prostate: Secondary | ICD-10-CM

## 2016-11-11 ENCOUNTER — Other Ambulatory Visit: Payer: Self-pay | Admitting: Radiation Oncology

## 2016-11-11 NOTE — Progress Notes (Signed)
Patient seen in consult in [place]:  75 yo man with high risk prostate cancer, Gleason's 4+5 and PSA of 14.1 - pending bone scan.  If bone scan negative, will proceed with 2 years ADT, external radiation and seed boost:   Dec 2017 - Start ADT with Dr. Alyson Ingles  Feb 2018 - Get 3 gold markers with Dr. Alyson Ingles  Feb 2018 - Get simulation at [facility] with Dr. Tammi Klippel  Feb 2018 - Start 5 weeks external radiation at [facility] with Dr. Tammi Klippel  May 2018 - Get seed implant boost at [place] [facility] with Dr. Orpah Melter  Nov 2019 - Complete ADT with Dr. Alyson Ingles

## 2016-11-15 ENCOUNTER — Other Ambulatory Visit: Payer: Self-pay | Admitting: Urology

## 2016-11-15 DIAGNOSIS — C61 Malignant neoplasm of prostate: Secondary | ICD-10-CM

## 2016-11-16 ENCOUNTER — Telehealth: Payer: Self-pay | Admitting: *Deleted

## 2016-11-16 NOTE — Telephone Encounter (Signed)
Called patient to inform of Lupron Inj. On 12-14-16- arrival time - 10:30 am in [place], and his gold seed placement on 02-01-17- arrival time - 12 pm in [place], and his sim on 02-08-17 - arrival time - 9:45 am in [place], lvm for a return call

## 2016-11-22 ENCOUNTER — Other Ambulatory Visit: Payer: Self-pay | Admitting: Urology

## 2016-11-23 ENCOUNTER — Other Ambulatory Visit: Payer: Self-pay | Admitting: Urology

## 2016-11-23 DIAGNOSIS — C61 Malignant neoplasm of prostate: Secondary | ICD-10-CM

## 2016-12-07 ENCOUNTER — Encounter (HOSPITAL_COMMUNITY): Payer: Self-pay

## 2016-12-07 ENCOUNTER — Encounter (HOSPITAL_COMMUNITY)
Admission: RE | Admit: 2016-12-07 | Discharge: 2016-12-07 | Disposition: A | Discharge status: Home / Self Care | Payer: Medicare Other | Source: Ambulatory Visit | Attending: Urology | Admitting: Urology

## 2016-12-07 DIAGNOSIS — R937 Abnormal findings on diagnostic imaging of other parts of musculoskeletal system: Secondary | ICD-10-CM | POA: Diagnosis not present

## 2016-12-07 DIAGNOSIS — C61 Malignant neoplasm of prostate: Secondary | ICD-10-CM | POA: Insufficient documentation

## 2016-12-07 MED ORDER — TECHNETIUM TC 99M MEDRONATE IV KIT
25.0000 | PACK | Freq: Once | INTRAVENOUS | Status: AC | PRN
  Administered 2016-12-07: 18 via INTRAVENOUS

## 2016-12-09 ENCOUNTER — Ambulatory Visit (HOSPITAL_COMMUNITY)
Admission: RE | Admit: 2016-12-09 | Discharge: 2016-12-09 | Disposition: A | Discharge status: Home / Self Care | Payer: Medicare Other | Source: Ambulatory Visit | Attending: Urology | Admitting: Urology

## 2016-12-09 ENCOUNTER — Encounter (HOSPITAL_COMMUNITY): Payer: Self-pay

## 2016-12-09 DIAGNOSIS — C61 Malignant neoplasm of prostate: Secondary | ICD-10-CM | POA: Diagnosis present

## 2016-12-09 DIAGNOSIS — K8020 Calculus of gallbladder without cholecystitis without obstruction: Secondary | ICD-10-CM | POA: Insufficient documentation

## 2016-12-09 DIAGNOSIS — J439 Emphysema, unspecified: Secondary | ICD-10-CM | POA: Diagnosis not present

## 2016-12-09 DIAGNOSIS — N2889 Other specified disorders of kidney and ureter: Secondary | ICD-10-CM | POA: Diagnosis not present

## 2016-12-09 DIAGNOSIS — J984 Other disorders of lung: Secondary | ICD-10-CM | POA: Insufficient documentation

## 2016-12-09 LAB — POCT I-STAT CREATININE: Creatinine, Ser: 1 mg/dL (ref 0.61–1.24)

## 2016-12-09 MED ORDER — IOPAMIDOL (ISOVUE-300) INJECTION 61%
100.0000 mL | Freq: Once | INTRAVENOUS | Status: AC | PRN
  Administered 2016-12-09: 100 mL via INTRAVENOUS

## 2016-12-14 ENCOUNTER — Ambulatory Visit (INDEPENDENT_AMBULATORY_CARE_PROVIDER_SITE_OTHER): Payer: Medicare Other | Admitting: Urology

## 2016-12-14 ENCOUNTER — Other Ambulatory Visit: Payer: Self-pay | Admitting: Urology

## 2016-12-14 DIAGNOSIS — C61 Malignant neoplasm of prostate: Secondary | ICD-10-CM

## 2016-12-14 DIAGNOSIS — C642 Malignant neoplasm of left kidney, except renal pelvis: Secondary | ICD-10-CM | POA: Diagnosis not present

## 2016-12-15 ENCOUNTER — Other Ambulatory Visit: Payer: Self-pay | Admitting: Urology

## 2016-12-15 MED ORDER — MAGNESIUM CITRATE PO SOLN: 1.0000 | Freq: Once | ORAL | Status: AC

## 2016-12-16 ENCOUNTER — Telehealth (HOSPITAL_COMMUNITY): Payer: Self-pay | Admitting: Hematology & Oncology

## 2016-12-22 ENCOUNTER — Encounter (HOSPITAL_COMMUNITY): Payer: Medicare Other | Attending: Oncology | Admitting: Oncology

## 2016-12-22 ENCOUNTER — Encounter (HOSPITAL_COMMUNITY): Payer: Self-pay | Admitting: Oncology

## 2016-12-22 ENCOUNTER — Ambulatory Visit (HOSPITAL_COMMUNITY): Payer: Medicare Other | Admitting: Oncology

## 2016-12-22 ENCOUNTER — Encounter (HOSPITAL_COMMUNITY): Payer: Medicare Other

## 2016-12-22 DIAGNOSIS — E119 Type 2 diabetes mellitus without complications: Secondary | ICD-10-CM | POA: Diagnosis not present

## 2016-12-22 DIAGNOSIS — Z7982 Long term (current) use of aspirin: Secondary | ICD-10-CM | POA: Insufficient documentation

## 2016-12-22 DIAGNOSIS — I898 Other specified noninfective disorders of lymphatic vessels and lymph nodes: Secondary | ICD-10-CM

## 2016-12-22 DIAGNOSIS — C61 Malignant neoplasm of prostate: Secondary | ICD-10-CM | POA: Diagnosis not present

## 2016-12-22 DIAGNOSIS — Z79899 Other long term (current) drug therapy: Secondary | ICD-10-CM | POA: Insufficient documentation

## 2016-12-22 DIAGNOSIS — Z7984 Long term (current) use of oral hypoglycemic drugs: Secondary | ICD-10-CM | POA: Insufficient documentation

## 2016-12-22 DIAGNOSIS — N2889 Other specified disorders of kidney and ureter: Secondary | ICD-10-CM | POA: Insufficient documentation

## 2016-12-22 DIAGNOSIS — D869 Sarcoidosis, unspecified: Secondary | ICD-10-CM | POA: Insufficient documentation

## 2016-12-22 LAB — COMPREHENSIVE METABOLIC PANEL
ALK PHOS: 76 U/L (ref 38–126)
ALT: 24 U/L (ref 17–63)
AST: 33 U/L (ref 15–41)
Albumin: 4.2 g/dL (ref 3.5–5.0)
Anion gap: 8 (ref 5–15)
BILIRUBIN TOTAL: 0.7 mg/dL (ref 0.3–1.2)
BUN: 16 mg/dL (ref 6–20)
CO2: 25 mmol/L (ref 22–32)
CREATININE: 1.02 mg/dL (ref 0.61–1.24)
Calcium: 9.2 mg/dL (ref 8.9–10.3)
Chloride: 107 mmol/L (ref 101–111)
GFR calc Af Amer: >60 mL/min (ref >60)
Glucose, Bld: 162 mg/dL — ABNORMAL HIGH (ref 65–99)
Potassium: 4 mmol/L (ref 3.5–5.1)
Sodium: 140 mmol/L (ref 135–145)
TOTAL PROTEIN: 8.4 g/dL — AB (ref 6.5–8.1)

## 2016-12-22 LAB — CBC WITH DIFFERENTIAL/PLATELET
BASOS ABS: 0 10*3/uL (ref 0.0–0.1)
Basophils Relative: 0 %
EOS ABS: 0.1 10*3/uL (ref 0.0–0.7)
EOS PCT: 2 %
HCT: 44.3 % (ref 39.0–52.0)
Hemoglobin: 13.9 g/dL (ref 13.0–17.0)
LYMPHS ABS: 2.3 10*3/uL (ref 0.7–4.0)
Lymphocytes Relative: 32 %
MCH: 26.8 pg (ref 26.0–34.0)
MCHC: 31.4 g/dL (ref 30.0–36.0)
MCV: 85.4 fL (ref 78.0–100.0)
MONO ABS: 0.8 10*3/uL (ref 0.1–1.0)
Monocytes Relative: 11 %
Neutro Abs: 4 10*3/uL (ref 1.7–7.7)
Neutrophils Relative %: 55 %
PLATELETS: 242 10*3/uL (ref 150–400)
RBC: 5.19 MIL/uL (ref 4.22–5.81)
RDW: 16.3 % — AB (ref 11.5–15.5)
WBC: 7.2 10*3/uL (ref 4.0–10.5)

## 2016-12-22 LAB — PSA: PSA: 25.28 ng/mL — AB (ref 0.00–4.00)

## 2016-12-22 NOTE — Progress Notes (Signed)
[place] @ [facility] Telephone:(336) [phone]  Fax:([phone]) [phone]   INITIAL CONSULT  Chanson Aschoff OB: Oct 30, 1941  MR#: [id number]  [id number]  Patient Care Team: Sinda Du, MD as PCP - General (Internal Medicine) Daneil Dolin, MD as Attending Physician (Gastroenterology)  CHIEF COMPLAINT:  Chief Complaint  Patient presents with  . New Patient (Initial Visit)    VISIT DIAGNOSIS:  No diagnosis found.   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 Tammi Klippel, radiation oncologist (November, 2017) 3.  Remote history of sarcoidosis 4.  History of diabetes  Oncology Flowsheet 05/16/2013 06/19/2015 06/20/2015  enoxaparin (LOVENOX) [place] - 40 mg 40 mg  ondansetron (ZOFRAN) IJ 4 mg - -    INTERVAL HISTORY:   75 year old gentleman was found to have an enlarged prostate at present time PSA is not available.  Urologists evaluated biopsy revealed high Gleason grade carcinoma of prostate.  Patient was referred to radiation oncologist. During that evaluation a CT scan revealed left kidney mass with the adjoining nodal mass.  There are multiple other small lymph nodes Patient has a bone scan which was showing some increase uptake but CT scan did not reveal any evidence of bone metastases.  Patient was referred to [place] for further evaluation and treatment consideration No flank pain.  No hematuria.  Appetite has been stable and has not lost any weight. REVIEW OF SYSTEMS:   GENERAL:  Feels good.  Active.  No fevers, sweats or weight loss. PERFORMANCE STATUS (ECOG):  0 HEENT:  No visual changes, runny nose, sore throat, mouth sores or tenderness. Lungs: No shortness of breath or cough.  No hemoptysis. Cardiac:  No chest pain, palpitations, orthopnea, or PND. GI:  No nausea, vomiting, diarrhea, constipation, melena or hematochezia. GU:  No urgency, frequency, dysuria, or  hematuria. Musculoskeletal:  No back pain.  No joint pain.  No muscle tenderness. Extremities:  No pain or swelling. Skin:  No rashes or skin changes. Neuro:  No headache, numbness or weakness, balance or coordination issues. Endocrine:  No diabetes, thyroid issues, hot flashes or night sweats. Psych:  No mood changes, depression or anxiety. Pain:  No focal pain. Review of systems:  All other systems reviewed and found to be negative.  As per HPI. Otherwise, a complete review of systems is negatve.  PAST MEDICAL HISTORY: Past Medical History:  Diagnosis Date  . Diabetes ([place])    on metformin per patient.  . Glaucoma     PAST SURGICAL HISTORY: Past Surgical History:  Procedure Laterality Date  . COLONOSCOPY N/A 05/16/2013   Procedure: COLONOSCOPY;  Surgeon: Daneil Dolin, MD;  Location: [facility];  Service: Endoscopy;  Laterality: N/A;  9:30  . cyst on left shoulder      FAMILY HISTORY Family History  Problem Relation Age of Onset  . Liver disease Father     NASH? age 19  . Diabetes Father   . Colon cancer Neg Hx         ADVANCED DIRECTIVES:   Patient does not have any living will or healthcare power of attorney.  Information was given .  Available resources had been discussed.  We will follow-up on subsequent appointments regarding this issue HEALTH MAINTENANCE: Social History  Substance Use Topics  . Smoking status: Never Smoker  . Smokeless tobacco: Never Used  . Alcohol use No    No Known Allergies  Current Outpatient Prescriptions  Medication Sig  Dispense Refill  . aspirin 325 MG tablet Take 1 tablet (325 mg total) by mouth daily. 30 tablet 5  . atorvastatin (LIPITOR) 80 MG tablet Take 1 tablet (80 mg total) by mouth daily at 6 PM. 30 tablet 12  . AZOPT 1 % ophthalmic suspension Place 1 drop into both eyes 2 (two) times daily.     [name] lisinopril (PRINIVIL,ZESTRIL) 2.5 MG tablet Take 1 tablet (2.5 mg total) by mouth daily. 30 tablet 12  . metFORMIN  (GLUCOPHAGE) 500 MG tablet Take 1 tablet (500 mg total) by mouth 2 (two) times daily with a meal. 60 tablet 5  . TRAVATAN Z 0.004 % SOLN ophthalmic solution Place 1 drop into both eyes at bedtime.      No current facility-administered medications for this visit.    Facility-Administered Medications Ordered in Other Visits  Medication Dose Route Frequency Provider Last Rate Last Dose  . magnesium citrate solution 1 Bottle  1 Bottle Oral Once Cleon Gustin, MD        OBJECTIVE: PHYSICAL EXAM: GENERAL:  Well developed, well nourished, sitting comfortably in the exam room in no acute distress. MENTAL STATUS:  Alert and oriented to person, place and time. HEAD:  .  Normocephalic, atraumatic, face symmetric, no Cushingoid features. EYES:.  Pupils equal round and reactive to light and accomodation.  No conjunctivitis or scleral icterus. ENT:  Oropharynx clear without lesion.  Tongue normal. Mucous membranes moist.  RESPIRATORY:  Clear to auscultation without rales, wheezes or rhonchi. CARDIOVASCULAR:  Regular rate and rhythm without murmur, rub or gallop. BREAST:  Right breast without masses, skin changes or nipple discharge.  Left breast without masses, skin changes or nipple discharge. ABDOMEN:  Soft, non-tender, with active bowel sounds, and no hepatosplenomegaly.  No masses. BACK:  No CVA tenderness.  No tenderness on percussion of the back or rib cage. SKIN:  No rashes, ulcers or lesions. EXTREMITIES: No edema, no skin discoloration or tenderness.  No palpable cords. LYMPH NODES: No palpable cervical, supraclavicular, axillary or inguinal adenopathy  NEUROLOGICAL: Unremarkable. PSYCH:  Appropriate.  Vitals:   12/22/16 1258 12/22/16 1306  BP: (!) 165/78 (!) 156/82  Pulse: 84   Resp: 20   Temp: 98.1 F (36.7 C)      Body mass index is 30.93 kg/m.    ECOG FS:0 - Asymptomatic  LAB RESULTS:  No visits with results within 5 Day(s) from this visit.  Latest known visit with  results is:  Hospital Outpatient Visit on 12/09/2016  Component Date Value Ref Range Status  . Creatinine, Ser 12/09/2016 1.00  0.61 - 1.24 mg/dL Final     STUDIES: Ct Abdomen Pelvis W Wo Contrast  Result Date: 12/09/2016 CLINICAL DATA:  New diagnosis of prostate cancer. Biopsy 11/02/2016. EXAM: CT ABDOMEN AND PELVIS WITHOUT AND WITH CONTRAST TECHNIQUE: Multidetector CT imaging of the abdomen and pelvis was performed following the standard protocol before and following the bolus administration of intravenous contrast. CONTRAST:  163mL ISOVUE-300 IOPAMIDOL (ISOVUE-300) INJECTION 61% COMPARISON:  None. FINDINGS: Lower chest: Interstitial scarring changes and emphysema noted at the lung bases. No pulmonary nodules or pleural effusion. The heart is normal in size. No pericardial effusion. Borderline enlarged epicardial lymph node on image number 5 measuring 10.5 mm. A few other small scattered epicardial and subdiaphragmatic lymph nodes are noted. Hepatobiliary: Cirrhotic changes involving the liver with a nodular irregular contour, increased caudate to right lobe ratio and prominent hepatic fissures. No definite hepatic lesions or intrahepatic biliary dilatation. Numerous  layering small gallstones are noted layering dependently in the gallbladder. No common bile duct dilatation. Pancreas: No mass, inflammation or ductal dilatation. Spleen: Normal size.  No focal lesions. Adrenals/Urinary Tract: Mild nodularity of the adrenal glands but no discrete lesion. Large densely calcified lesion involving the right kidney measuring a maximum of 4.5 cm. This is most likely benign. They could be an angiomyolipoma that bled and calcified. 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 There is also a 2 cm cystic lesion involving the midpole region anteriorly which is likely a complex cyst with proteinaceous debris or small amount of hemorrhage. No enhancement is identified.  There is a large solid mass associated with the left kidney measuring 9.5 x 10 cm and projecting off the anterior mid pole region. There is an adjacent large nodal mass measuring 5.8 x 4.3 cm. This is worrisome for renal cell carcinoma with nodal metastasis. Stomach/Bowel: The stomach, duodenum, small bowel and colon are grossly normal without oral contrast. No inflammatory changes, mass lesions or obstructive findings. The terminal ileum is normal. The appendix is normal. Extensive descending and sigmoid colon diverticulosis without findings for acute diverticulitis. Vascular/Lymphatic: The aorta is normal in caliber. Scattered atherosclerotic calcifications. There are enlarged gastrohepatic ligament, celiac axis and periportal lymph nodes most likely due to the patient's cirrhosis. Left-sided retroperitoneal lymph nodes are worrisome for metastatic disease in the left renal cell cancer. 15 mm lymph node on image number 29 and other smaller surrounding lymph nodes. In the pelvis there are several small obturator and external iliac lymph nodes bilaterally which are suspicious for prostate metastasis. Right external iliac lymph node on image number 66 measures 6.5 mm. 9 mm operator lymph node on the left on image number 60. 8.5 mm lymph node on image number 31 is noted near the right external iliac artery. Bilateral borderline inguinal lymph nodes measuring a maximum of 10 mm. Reproductive: The prostate gland is enlarged. Mild median lobe hypertrophy impressing on the base of the bladder. The seminal vesicles are normal. Other: Small bilateral inguinal hernias containing fat. Musculoskeletal: No lytic or sclerotic bone lesions are identified to suggest metastatic prostate cancer or metastatic renal cell cancer. IMPRESSION: 1. 10 cm enhancing mass associated with the left kidney consistent with a large renal cell carcinoma. There is also an adjacent nodal mass and borderline left-sided retroperitoneal lymph nodes. 2.  Multiple benign-appearing right renal lesions. 3. Cirrhotic changes involving the liver with associated typical subdiaphragmatic, epicardial and upper abdominal lymph nodes. No worrisome hepatic lesions. 4. Borderline enlarged pelvic lymph nodes worrisome for prostate cancer metastasis. 5. No worrisome bone lesions to suggest metastatic disease. 6. Cholelithiasis. 7. Emphysematous changes and interstitial scarring at the lung bases. These results will be called to the ordering clinician or representative by the Radiologist Assistant, and communication documented in the PACS or zVision Dashboard. Electronically Signed   By: Marijo Sanes M.D.   On: 12/09/2016 10:02   Nm Bone Scan Whole Body  Result Date: 12/07/2016 CLINICAL DATA:  Prostate cancer. EXAM: NUCLEAR MEDICINE WHOLE BODY BONE SCAN TECHNIQUE: Whole body anterior and posterior images were obtained approximately 3 hours after intravenous injection of radiopharmaceutical. RADIOPHARMACEUTICALS:  18.0 mCi Technetium-21m MDP IV COMPARISON:  No prior bone scan. FINDINGS: Bilateral renal function excretion. Multifocal areas of mild increased activity noted about the cervicothoracic and thoracolumbar spine. Moderate increased activity noted about the left sternoclavicular region. Although these changes may be degenerative, metastatic disease cannot be excluded.  Cervical, thoracic, lumbar spine spine series and sternoclavicular series can be obtained for further evaluation. Mild increased activity noted over both AC joints and knees consistent with degenerative change. IMPRESSION: Mild increased activity noted about the cervicothoracic, thoracolumbar spine. Moderate increased activity about the sternoclavicular region. Although these changes may be degenerative metastatic disease cannot be excluded. Plain film imaging of the cervical, thoracic, lumbar spine as well as the left sternoclavicular joint should be considered. Electronically Signed   By: Marcello Moores   Register   On: 12/07/2016 15:00    ASSESSMENT:  1.  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  High Gleason grade carcinoma prostate.  Radiation evaluation has been pending 3.  Remote history of sarcoidosis and diabetes  PLAN:    PET scan might help to get baseline evaluation Patient may need to have nephrectomy done Later on we will need Lupron and radiation therapy for prostate cancer and depending on staging of kidney cancer further adjuvant treatment may be needed.  A detailed discussion with patient.  CT scan has been evaluated.  After baseline PET scanning and PSA evaluation patient would be seen in our cancer center by oncologists and will communicate findings with urologist.  Patient expressed understanding and was in agreement with this plan. He also understands that He can call clinic at any time with any questions, concerns, or complaints.    No matching staging information was found for the patient.  Forest Gleason, MD   12/22/2016 1:11 PM

## 2016-12-22 NOTE — Patient Instructions (Addendum)
[place] at [facility] Discharge Instructions  RECOMMENDATIONS MADE BY THE CONSULTANT AND ANY TEST RESULTS WILL BE SENT TO YOUR REFERRING PHYSICIAN.   Today you saw Dr. Oliva Bustard.   We see a big about 4-5 inch mass on the kidney with some lymph nodes around it. The whole kidney will need to be removed.   Prior to January 26 we would like to perform a PET scan to look at your whole body to make sure that nothing has gone outside of the kidney.   Labs today on the 1st floor  See Dr. Whitney Muse after PET scan and before surgery  No radiation until after your kidney surgery     Thank you for choosing [place] at [facility] to provide your oncology and hematology care.  To afford each patient quality time with our provider, please arrive at least 15 minutes before your scheduled appointment time.    If you have a lab appointment with the [facility] please come in thru the  Main Entrance and check in at the main information desk  You need to re-schedule your appointment should you arrive 10 or more minutes late.  We strive to give you quality time with our providers, and arriving late affects you and other patients whose appointments are after yours.  Also, if you no show three or more times for appointments you may be dismissed from the clinic at the providers discretion.     Again, thank you for choosing [facility].  Our hope is that these requests will decrease the amount of time that you wait before being seen by our physicians.       _____________________________________________________________  Should you have questions after your visit to [facility], please contact our office at [phone] between the hours of 8:30 a.m. and 4:30 p.m.  Voicemails left after 4:30 p.m. will not be returned until the following business day.  For prescription refill requests, have your pharmacy contact our office.        Resources For Cancer Patients and their Caregivers ? American Cancer Society: Can assist with transportation, wigs, general needs, runs Look Good Feel Better.        ([phone] ? Cancer Care: Provides financial assistance, online support groups, medication/co-pay assistance.  1-800-813-HOPE 818-113-4366) ? [place] Assists [place] cancer patients and their families through emotional , educational and financial support.  9153139974 ? [place] DSS Where to apply for food stamps, Medicaid and utility assistance. 704-135-5821 ? RCATS: Transportation to medical appointments. 651-548-6509 ? Social Security Administration: May apply for disability if have a Stage IV cancer. 575-033-8488 416-046-5865 ? [employer], Disability and Transit Services: Assists with nutrition, care and transit needs. [place] Support Programs: @10RELATIVEDAYS @ > Cancer Support Group  2nd Tuesday of the month 1pm-2pm, Journey Room  > Creative Journey  3rd Tuesday of the month 1130am-1pm, Journey Room  > Look Good Feel Better  1st Wednesday of the month 10am-12 noon, Journey Room (Call [place] to register [phone])  PET Scan Introduction A PET scan, also called positron emission tomography, is a test that creates pictures of the inside of the body. A PET scan requires a small dose of a harmless radioactive material to be injected into a vein. When this material combines with certain substances in the body, it produces tiny particles that can be detected by a scanner and converted into  pictures. The pictures created during a PET scan can be used to study a disease. They are often used to study cancer and cancer therapy. The colors and brightness on the pictures show different levels of organ and tissue function. For example, cancer tissue appears brighter than normal tissue on a PET scan picture. Tell a health care provider  about:  Any allergies you have.  All medicines you are taking, including vitamins, herbs, eye drops, creams, and over-the-counter medicines.  Any problems you or family members have had with anesthetic medicines.  Any blood disorders you have.  Any surgeries you have had.  Any medical conditions you have.  If you are afraid of cramped spaces (claustrophobic). If claustrophobia is a problem, it usually can be relieved with mild sedatives or antianxiety medicines.  If you have trouble staying still for long periods of time. What happens before the procedure?  Do not eat or drink anything after midnight on the night before the procedure or as directed by your health care provider.  Take medicines only as directed by your health care provider.  If you have diabetes, ask your health care provider for diet guidelines to control sugar (glucose) levels on the day of the test. What happens during the procedure?  A small amount of radioactive material will be injected into a vein. The test will begin 30-60 minutes after the injection, when the material has traveled around your body.  You will lie on a cushioned table, and the table will be moved through the center of a machine that looks like a large donut. It will take about 30-60 minutes for the machine to produce pictures of your body. You will need to stay still during this time. What happens after the procedure?  You may resume your normal diet and activities.  Drink several 8 oz glasses of water following the test to flush the radioactive material out of your body. This information is not intended to replace advice given to you by your health care provider. Make sure you discuss any questions you have with your health care provider. Document Released: 06/18/2003 Document Revised: 05/19/2016 Document Reviewed: 03/26/2014  2017 Elsevier

## 2016-12-28 ENCOUNTER — Other Ambulatory Visit: Payer: Self-pay | Admitting: Urology

## 2016-12-28 DIAGNOSIS — C642 Malignant neoplasm of left kidney, except renal pelvis: Secondary | ICD-10-CM

## 2017-01-04 ENCOUNTER — Ambulatory Visit (HOSPITAL_COMMUNITY)
Admission: RE | Admit: 2017-01-04 | Discharge: 2017-01-04 | Disposition: A | Discharge status: Home / Self Care | Payer: Medicare Other | Source: Ambulatory Visit | Attending: Urology | Admitting: Urology

## 2017-01-04 DIAGNOSIS — R1902 Left upper quadrant abdominal swelling, mass and lump: Secondary | ICD-10-CM | POA: Insufficient documentation

## 2017-01-04 DIAGNOSIS — K4190 Unilateral femoral hernia, without obstruction or gangrene, not specified as recurrent: Secondary | ICD-10-CM | POA: Diagnosis not present

## 2017-01-04 DIAGNOSIS — K8020 Calculus of gallbladder without cholecystitis without obstruction: Secondary | ICD-10-CM | POA: Diagnosis not present

## 2017-01-04 DIAGNOSIS — R918 Other nonspecific abnormal finding of lung field: Secondary | ICD-10-CM | POA: Insufficient documentation

## 2017-01-04 DIAGNOSIS — K5730 Diverticulosis of large intestine without perforation or abscess without bleeding: Secondary | ICD-10-CM | POA: Insufficient documentation

## 2017-01-04 DIAGNOSIS — K7460 Unspecified cirrhosis of liver: Secondary | ICD-10-CM | POA: Insufficient documentation

## 2017-01-04 DIAGNOSIS — I2510 Atherosclerotic heart disease of native coronary artery without angina pectoris: Secondary | ICD-10-CM | POA: Insufficient documentation

## 2017-01-04 DIAGNOSIS — N400 Enlarged prostate without lower urinary tract symptoms: Secondary | ICD-10-CM | POA: Insufficient documentation

## 2017-01-04 DIAGNOSIS — J849 Interstitial pulmonary disease, unspecified: Secondary | ICD-10-CM | POA: Diagnosis not present

## 2017-01-04 DIAGNOSIS — N2889 Other specified disorders of kidney and ureter: Secondary | ICD-10-CM | POA: Insufficient documentation

## 2017-01-04 DIAGNOSIS — C642 Malignant neoplasm of left kidney, except renal pelvis: Secondary | ICD-10-CM | POA: Diagnosis not present

## 2017-01-04 DIAGNOSIS — R590 Localized enlarged lymph nodes: Secondary | ICD-10-CM | POA: Diagnosis not present

## 2017-01-04 DIAGNOSIS — I700 Atherosclerosis of aorta: Secondary | ICD-10-CM | POA: Diagnosis not present

## 2017-01-04 MED ORDER — IOPAMIDOL (ISOVUE-300) INJECTION 61%
75.0000 mL | Freq: Once | INTRAVENOUS | Status: AC | PRN
  Administered 2017-01-04: 75 mL via INTRAVENOUS

## 2017-01-05 ENCOUNTER — Ambulatory Visit (HOSPITAL_COMMUNITY)
Admission: RE | Admit: 2017-01-05 | Discharge: 2017-01-05 | Disposition: A | Discharge status: Home / Self Care | Payer: Medicare Other | Source: Ambulatory Visit | Attending: Oncology | Admitting: Oncology

## 2017-01-05 DIAGNOSIS — C642 Malignant neoplasm of left kidney, except renal pelvis: Secondary | ICD-10-CM | POA: Diagnosis not present

## 2017-01-05 DIAGNOSIS — N2889 Other specified disorders of kidney and ureter: Secondary | ICD-10-CM

## 2017-01-05 LAB — GLUCOSE, CAPILLARY: Glucose-Capillary: 95 mg/dL (ref 65–99)

## 2017-01-05 MED ORDER — FLUDEOXYGLUCOSE F - 18 (FDG) INJECTION
9.8000 | Freq: Once | INTRAVENOUS | Status: AC | PRN
  Administered 2017-01-05: 9.8 via INTRAVENOUS

## 2017-01-09 ENCOUNTER — Encounter (HOSPITAL_COMMUNITY): Payer: Self-pay | Admitting: Hematology & Oncology

## 2017-01-09 ENCOUNTER — Encounter (HOSPITAL_COMMUNITY): Payer: Medicare Other | Attending: Oncology | Admitting: Hematology & Oncology

## 2017-01-09 ENCOUNTER — Ambulatory Visit (HOSPITAL_COMMUNITY): Payer: Medicare Other | Admitting: Hematology & Oncology

## 2017-01-09 VITALS — BP 163/94 | HR 112 | Temp 98.2°F | Resp 16 | Ht 67.0 in | Wt 200.0 lb

## 2017-01-09 DIAGNOSIS — C61 Malignant neoplasm of prostate: Secondary | ICD-10-CM

## 2017-01-09 DIAGNOSIS — N2889 Other specified disorders of kidney and ureter: Secondary | ICD-10-CM | POA: Diagnosis present

## 2017-01-09 DIAGNOSIS — Z79899 Other long term (current) drug therapy: Secondary | ICD-10-CM | POA: Insufficient documentation

## 2017-01-09 DIAGNOSIS — D869 Sarcoidosis, unspecified: Secondary | ICD-10-CM | POA: Insufficient documentation

## 2017-01-09 DIAGNOSIS — Z7982 Long term (current) use of aspirin: Secondary | ICD-10-CM | POA: Insufficient documentation

## 2017-01-09 DIAGNOSIS — Z7984 Long term (current) use of oral hypoglycemic drugs: Secondary | ICD-10-CM | POA: Insufficient documentation

## 2017-01-09 DIAGNOSIS — E119 Type 2 diabetes mellitus without complications: Secondary | ICD-10-CM | POA: Insufficient documentation

## 2017-01-09 DIAGNOSIS — Z862 Personal history of diseases of the blood and blood-forming organs and certain disorders involving the immune mechanism: Secondary | ICD-10-CM

## 2017-01-09 NOTE — Patient Instructions (Signed)
[place] at [facility] Discharge Instructions  RECOMMENDATIONS MADE BY THE CONSULTANT AND ANY TEST RESULTS WILL BE SENT TO YOUR REFERRING PHYSICIAN.  You were seen today by Dr. Whitney Muse. Return 2 weeks after the 26th.   Thank you for choosing [place] at [facility] to provide your oncology and hematology care.  To afford each patient quality time with our provider, please arrive at least 15 minutes before your scheduled appointment time.    If you have a lab appointment with the [facility] please come in thru the  Main Entrance and check in at the main information desk  You need to re-schedule your appointment should you arrive 10 or more minutes late.  We strive to give you quality time with our providers, and arriving late affects you and other patients whose appointments are after yours.  Also, if you no show three or more times for appointments you may be dismissed from the clinic at the providers discretion.     Again, thank you for choosing [facility].  Our hope is that these requests will decrease the amount of time that you wait before being seen by our physicians.       _____________________________________________________________  Should you have questions after your visit to [facility], please contact our office at [phone] between the hours of 8:30 a.m. and 4:30 p.m.  Voicemails left after 4:30 p.m. will not be returned until the following business day.  For prescription refill requests, have your pharmacy contact our office.       Resources For Cancer Patients and their Caregivers ? American Cancer Society: Can assist with transportation, wigs, general needs, runs Look Good Feel Better.        (214)263-7092 ? Cancer Care: Provides financial assistance, online support groups, medication/co-pay assistance.  1-800-813-HOPE 224-452-6340) ? [place] Assists [place]  cancer patients and their families through emotional , educational and financial support.  782-702-3706 ? [place] DSS Where to apply for food stamps, Medicaid and utility assistance. 405-768-9792 ? RCATS: Transportation to medical appointments. 616-170-6239 ? Social Security Administration: May apply for disability if have a Stage IV cancer. 914-261-5020 870-426-6278 ? [employer], Disability and Transit Services: Assists with nutrition, care and transit needs. [place] Support Programs: @10RELATIVEDAYS @ > Cancer Support Group  2nd Tuesday of the month 1pm-2pm, Journey Room  > Creative Journey  3rd Tuesday of the month 1130am-1pm, Journey Room  > Look Good Feel Better  1st Wednesday of the month 10am-12 noon, Journey Room (Call [place] to register [phone])

## 2017-01-10 NOTE — Progress Notes (Signed)
Progress Note  Jon Brooks OB: 10-Jul-1941  MR#: [id number]  [id number]  Patient Care Team: Sinda Du, MD as PCP - General (Internal Medicine) Daneil Dolin, MD as Attending Physician (Gastroenterology)  CHIEF COMPLAINT:  Chief Complaint  Patient presents with  . Follow-up    VISIT DIAGNOSIS:  No diagnosis found.   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 Tammi Klippel, radiation oncologist (November, 2017) 3.  Remote history of sarcoidosis 4.  History of diabetes  Oncology Flowsheet 05/16/2013 06/19/2015 06/20/2015  enoxaparin (LOVENOX) [place] - 40 mg 40 mg  ondansetron (ZOFRAN) IJ 4 mg - -    INTERVAL HISTORY:   76 year old gentleman was found to have an enlarged prostate at present time PSA is not available.  Biopsy revealed high Gleason grade carcinoma of prostate.  Patient was referred to Dr Tammi Klippel. [name] notes that nothing was done regarding his prostate until "things were cleared up" regarding his kidney mass and lymph nodes.  During that evaluation a CT scan revealed left kidney mass with the adjoining nodal mass.  There are multiple other small lymph nodes Patient has a bone scan which was showing some increase uptake but CT scan did not reveal any evidence of bone metastases.  Patient was referred to [place] for further evaluation and treatment consideration No flank pain.  No hematuria.  Appetite has been stable and has not lost any weight. He notes that he still feels well. He denies pain.  PET /CT was performed on 01/05/2017. Patient presents to review results. He is scheduled to undergo nephrectomy with Dr. Alyson Ingles at the end of the month.     REVIEW OF SYSTEMS:   GENERAL:  Feels good.  Active.  No fevers, sweats or weight loss. PERFORMANCE STATUS (ECOG):  0 HEENT:  No visual changes, runny nose, sore throat, mouth sores or tenderness. Lungs: No shortness of  breath or cough.  No hemoptysis. Cardiac:  No chest pain, palpitations, orthopnea, or PND. GI:  No nausea, vomiting, diarrhea, constipation, melena or hematochezia. GU:  No urgency, frequency, dysuria, or hematuria. Musculoskeletal:  No back pain.  No joint pain.  No muscle tenderness. Extremities:  No pain or swelling. Skin:  No rashes or skin changes. Neuro:  No headache, numbness or weakness, balance or coordination issues. Endocrine:  No diabetes, thyroid issues, hot flashes or night sweats. Psych:  No mood changes, depression or anxiety. Pain:  No focal pain. Review of systems:  All other systems reviewed and found to be negative.  As per HPI. Otherwise, a complete review of systems is negatve.  PAST MEDICAL HISTORY: Past Medical History:  Diagnosis Date  . Diabetes ([place])    on metformin per patient.  . Glaucoma   . Sarcoidosis ([place])    year 1980    PAST SURGICAL HISTORY: Past Surgical History:  Procedure Laterality Date  . COLONOSCOPY N/A 05/16/2013   Procedure: COLONOSCOPY;  Surgeon: Daneil Dolin, MD;  Location: [facility];  Service: Endoscopy;  Laterality: N/A;  9:30  . cyst on left shoulder      FAMILY HISTORY Family History  Problem Relation Age of Onset  . Liver disease Father     NASH? age 47  . Diabetes Father   . Colon cancer Neg Hx      ADVANCED DIRECTIVES:   Patient does not have any living will or healthcare power of attorney.  Information  was given .  Available resources had been discussed.  We will follow-up on subsequent appointments regarding this issue   HEALTH MAINTENANCE: Social History  Substance Use Topics  . Smoking status: Never Smoker  . Smokeless tobacco: Never Used  . Alcohol use No    No Known Allergies  Current Outpatient Prescriptions  Medication Sig Dispense Refill  . aspirin 325 MG tablet Take 1 tablet (325 mg total) by mouth daily. 30 tablet 5  . atorvastatin (LIPITOR) 80 MG tablet Take 1 tablet (80 mg total) by  mouth daily at 6 PM. 30 tablet 12  . AZOPT 1 % ophthalmic suspension Place 1 drop into both eyes 2 (two) times daily.     [name] lisinopril (PRINIVIL,ZESTRIL) 2.5 MG tablet Take 1 tablet (2.5 mg total) by mouth daily. 30 tablet 12  . metFORMIN (GLUCOPHAGE) 500 MG tablet Take 1 tablet (500 mg total) by mouth 2 (two) times daily with a meal. 60 tablet 5  . TRAVATAN Z 0.004 % SOLN ophthalmic solution Place 1 drop into both eyes at bedtime.      No current facility-administered medications for this visit.    Facility-Administered Medications Ordered in Other Visits  Medication Dose Route Frequency Provider Last Rate Last Dose  . magnesium citrate solution 1 Bottle  1 Bottle Oral Once Cleon Gustin, MD        OBJECTIVE: PHYSICAL EXAM: GENERAL:  Well developed, well nourished, sitting comfortably in the exam room in no acute distress. MENTAL STATUS:  Alert and oriented to person, place and time. HEAD:  .  Normocephalic, atraumatic, face symmetric, no Cushingoid features. EYES:.  Pupils equal round and reactive to light and accomodation.  No conjunctivitis or scleral icterus. ENT:  Oropharynx clear without lesion.  Tongue normal. Mucous membranes moist.  RESPIRATORY:  Clear to auscultation without rales, wheezes or rhonchi. CARDIOVASCULAR:  Regular rate and rhythm without murmur, rub or gallop. BREAST:  Right breast without masses, skin changes or nipple discharge.  Left breast without masses, skin changes or nipple discharge. ABDOMEN:  Soft, non-tender, with active bowel sounds, and no hepatosplenomegaly.  No masses. BACK:  No CVA tenderness.  No tenderness on percussion of the back or rib cage. SKIN:  No rashes, ulcers or lesions. EXTREMITIES: No edema, no skin discoloration or tenderness.  No palpable cords. LYMPH NODES: No palpable cervical, supraclavicular, axillary or inguinal adenopathy  NEUROLOGICAL: Unremarkable. PSYCH:  Appropriate.  Vitals:   01/09/17 1326  BP: (!) 163/94    Pulse: (!) 112  Resp: 16  Temp: 98.2 F (36.8 C)     Body mass index is 31.32 kg/m.    ECOG FS:0 - Asymptomatic  LAB RESULTS:  Hospital Outpatient Visit on 01/05/2017  Component Date Value Ref Range Status  . Glucose-Capillary 01/05/2017 95  65 - 99 mg/dL Final     STUDIES: Ct Chest W Contrast  Result Date: 01/04/2017 CLINICAL DATA:  Prostate and kidney cancer. EXAM: CT CHEST WITH CONTRAST TECHNIQUE: Multidetector CT imaging of the chest was performed during intravenous contrast administration. CONTRAST:  11mL ISOVUE-300 IOPAMIDOL (ISOVUE-300) INJECTION 61% COMPARISON:  CT abdomen pelvis 12/09/2016. FINDINGS: Cardiovascular: Atherosclerotic calcification of the arterial vasculature, including coronary arteries. Heart size normal. No pericardial effusion. Mediastinum/Nodes: Right internal mammary lymph node measures 6 mm. No pathologically enlarged mediastinal or hilar lymph nodes. Lymph node adjacent to the mid descending thoracic aorta measures 12 mm (series 2, image 90), stable when remeasured. Pre pericardiac lymph nodes measure up to 1.3 cm, also stable  when remeasured. No axillary adenopathy. Esophagus is grossly unremarkable. Lungs/Pleura: There is upper/midlung zone predominant interstitial coarsening and architectural distortion. Mild volume loss posteriorly in the left upper lobe. No pleural fluid. Airway is unremarkable. Upper Abdomen: Liver margin is irregular. Stones layer in the gallbladder. Visualized portions of the adrenal glands and right kidney are unremarkable. Subcentimeter low-attenuation lesion in the upper pole left kidney is too small to characterize. Hyperattenuating mass in the left upper quadrant is partially imaged. Visualized portions of the spleen, pancreas, stomach and bowel are grossly unremarkable. Gastrohepatic ligament lymph nodes measure up to 11 mm, stable. Porta hepatis lymph nodes measure up to 12 mm, also stable. Musculoskeletal: No worrisome lytic or  sclerotic lesions. Degenerative changes are seen in the spine. IMPRESSION: 1. Partially imaged left upper quadrant nodal mass. Please refer to CT abdomen pelvis 12/09/2016. 2. Upper and midlung zone predominant interstitial coarsening and mild architectural distortion, nonspecific. Differential diagnosis includes nonspecific interstitial pneumonitis and chronic hypersensitivity pneumonitis. 3. Cirrhosis [id number]). Mild adenopathy in the lower chest and upper abdomen, as before, likely related. 4. Aortic atherosclerosis (ICD10-170.0). Coronary artery calcification. 5. Cholelithiasis. Electronically Signed   By: Lorin Picket M.D.   On: 01/04/2017 10:24   Nm Pet Image Initial (pi) Skull Base To Thigh  Result Date: 01/05/2017 CLINICAL DATA:  Initial treatment strategy for left renal mass. EXAM: NUCLEAR MEDICINE PET SKULL BASE TO THIGH TECHNIQUE: 9.8 mCi F-18 FDG was injected intravenously. Full-ring PET imaging was performed from the skull base to thigh after the radiotracer. CT data was obtained and used for attenuation correction and anatomic localization. FASTING BLOOD GLUCOSE:  Value: 95 mg/dl COMPARISON:  01/04/2017 and 12/09/2016 FINDINGS: NECK Likely physiologic tonsillar activity. No hypermetabolic adenopathy in the neck. CHEST Coarse chronic interstitial lung disease. A left upper superior mammary lymph node measuring 6 mm in short axis on image 64/4 has a maximum standard uptake value of 3.5. Background mediastinal blood pool activity is 3.8. A pericardial lymph node measuring 1.1 cm in short axis has a maximum standard uptake value of 3.1 ABDOMEN/PELVIS The solid left renal mass measuring 11.3 by 9.4 cm on image 129/4 has a central internal maximum standard uptake value of 5.1 and appears to have mostly homogeneous activity aside from some minimal central necrosis inferiorly. Along its margin and extending out through the perirenal space and potentially into the peritoneal cavity there is a 6.8 by  4.6 cm solid mass with metabolic activity with maximum SUV 3.8. This has a similar appearance to the renal mass and probably represent struck local invasion. Faintly accentuated activity along the adrenal glands without CT correlate, likely incidental. In the right mid kidney there is a densely calcified exophytic mass with internal metabolic activity, maximum SUV approximately 7.1. Region of interest for this measurement was carefully chosen to avoid contrast from the collecting systems and the adjacent physiologically hypermetabolic colon. Small fatty margin of this mass inferiorly of uncertain significance. Several other smaller hypodense right renal lesions are technically nonspecific. A gastrohepatic ligament lymph node measuring 1.1 cm in short axis on image 100/4 has a maximum SUV of 2.9. A left periaortic lymph node measuring up to 1.3 cm in short axis is present just medial to the left renal mass, with low-grade activity similar to the mass. Descending and sigmoid colon diverticulosis. Cholelithiasis. Somewhat nodular contour of the liver. Considerably enlarged prostate gland. Mildly accentuated activity along the left lateral peripheral zone, maximum SUV 5.8. High activity along the scrotum likely from a small amount  of incontinence. SKELETON No focal hypermetabolic activity to suggest skeletal metastasis. Left femoral hernia noted with associated compression of the left common femoral vein, containing adipose tissue, image 173/4. IMPRESSION: 1. 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 The masslike extension of this process through the perirenal fascia and along the adjacent peritoneal margin probably represents local extent of tumor. There is a left periaortic lymph node which also has low grade metabolic activity and is likely malignant. Low-grade activity in multiple lymph nodes including a  right upper internal mammary lymph node; a lower periaortic lymph node in the chest; the pericardial lymph node; and a gastrohepatic ligament node. These nodes are all mildly enlarged. Although the activity of these nodes is low-grade, so is the activity at the original tumor. There also some scattered mesenteric lymph nodes eccentric to the right which are not overtly hypermetabolic. Particularly the intra-abdominal nodal prominence could be due to reactive lymph nodes from cirrhosis. 2. Enlarged prostate gland with some faintly accentuated metabolic activity in the left lateral peripheral zone. This could reflect prostate cancer. 3. Coarse chronic interstitial lung disease. 4. Densely calcified exophytic mass of the right kidney has internal hypermetabolic activity. 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 The diffuse calcification pattern would be unusual but not impossible for renal cell carcinoma and there is a fatty component along the inferior margin. 5. Other imaging findings of potential clinical significance: Descending and sigmoid colon diverticulosis. Coronary, aortic arch, and branch vessel atherosclerotic vascular disease. Aortoiliac atherosclerotic vascular disease. Left femoral hernia. Cholelithiasis. Nodular contour of the liver, query cirrhosis. Electronically Signed   By: Van Clines M.D.   On: 01/05/2017 14:57    ASSESSMENT:  1.  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  High Gleason grade carcinoma prostate.  Radiation evaluation has been pending 3.  Remote history of sarcoidosis and diabetes 4. R renal mass  PLAN:   PET/CT was reviewed with the patient in detail. Results are noted above. We discussed some of the adenopathy, low uptake. I advised him that it may not be malignancy, but will need to be followed. He has a remote history of sarcoidosis.   R renal mass noted, further evaluation/recommendations  by urology. He will proceed with nephrectomy as planned. RTC post nephrectomy to discuss ongoing follow-up vs. Adjuvant therapy with sutent. He will also follow-up with Dr. Tammi Klippel for treatment of his prostate cancer once he is recovered from surgery.  Patient expressed understanding and was in agreement with this plan. He also understands that He can call clinic at any time with any questions, concerns, or complaints.    No matching staging information was found for the patient.  Molli Hazard, MD   01/10/2017 5:58 PM

## 2017-01-12 ENCOUNTER — Encounter (HOSPITAL_COMMUNITY): Payer: Self-pay | Admitting: Hematology & Oncology

## 2017-01-16 NOTE — Progress Notes (Signed)
ECHO 06-20-15 epic CT Chest 01-04-17 epic

## 2017-01-16 NOTE — Patient Instructions (Addendum)
Jon Brooks  01/16/2017   Your procedure is scheduled on: 01-20-17  Report to [facility]  Entrance take [facility]  elevators to 3rd floor to  [place] at 1015AM.  Call this number if you have problems the morning of surgery [phone]   Remember: ONLY 1 PERSON MAY GO WITH YOU TO SHORT STAY TO GET  READY MORNING OF [place].  Do not eat food :After Midnight. Clear liquids all day Thursday. Follow all bowel prep  Instructions per Dr. Alyson Ingles. Nothing by mouth after  midnight Thursday night!     Take these medicines the morning of surgery with A SIP OF WATER: eye drops DO NOT TAKE ANY DIABETIC MEDICATIONS DAY OF YOUR SURGERY                               You may not have any metal on your body including hair pins and              piercings  Do not wear jewelry, make-up, lotions, powders or perfumes, deodorant             Do not wear nail polish.  Do not shave  48 hours prior to surgery.              Men may shave face and neck.   Do not bring valuables to the hospital. [place].  Contacts, dentures or bridgework may not be worn into surgery.  Leave suitcase in the car. After surgery it may be brought to your room.              Please read over the following fact sheets you were given: _____________________________________________________________________     CLEAR LIQUID DIET   Foods Allowed                                                                     Foods Excluded  Coffee and tea, regular and decaf                             liquids that you cannot  Plain Jell-O in any flavor                                             see through such as: Fruit ices (not with fruit pulp)                                     milk, soups, orange juice  Iced Popsicles                                    All solid food Carbonated beverages, regular and diet  Cranberry,  grape and apple juices Sports drinks like Gatorade Lightly seasoned clear broth or consume(fat free) Sugar, honey syrup  Sample Menu Breakfast                                Lunch                                     Supper Cranberry juice                    Beef broth                            Chicken broth Jell-O                                     Grape juice                           Apple juice Coffee or tea                        Jell-O                                      Popsicle                                                Coffee or tea                        Coffee or tea  _____________________________________________________________________              How to Manage Your Diabetes Before and After Surgery  Why is it important to control my blood sugar before and after surgery? . Improving blood sugar levels before and after surgery helps healing and can limit problems. . A way of improving blood sugar control is eating a healthy diet by: o  Eating less sugar and carbohydrates o  Increasing activity/exercise o  Talking with your doctor about reaching your blood sugar goals . High blood sugars (greater than 180 mg/dL) can raise your risk of infections and slow your recovery, so you will need to focus on controlling your diabetes during the weeks before surgery. . Make sure that the doctor who takes care of your diabetes knows about your planned surgery including the date and location.  How do I manage my blood sugar before surgery? . Check your blood sugar at least 4 times a day, starting 2 days before surgery, to make sure that the level is not too high or low. o Check your blood sugar the morning of your surgery when you wake up and every 2 hours until you get to the Short Stay unit. . If your blood sugar is less than 70 mg/dL, you will need to treat for low blood sugar: o Do not take insulin. o Treat a low blood sugar (less than 70 mg/dL) with  cup of  clear juice  (cranberry or apple), 4 glucose tablets, OR glucose gel. o Recheck blood sugar in 15 minutes after treatment (to make sure it is greater than 70 mg/dL). If your blood sugar is not greater than 70 mg/dL on recheck, call [phone] for further instructions. . Report your blood sugar to the short stay nurse when you get to Short Stay.  . If you are admitted to the hospital after surgery: o Your blood sugar will be checked by the staff and you will probably be given insulin after surgery (instead of oral diabetes medicines) to make sure you have good blood sugar levels. o The goal for blood sugar control after surgery is 80-180 mg/dL.   WHAT DO I DO ABOUT MY DIABETES MEDICATION?  [name] Do not take oral diabetes medicines (pills) the morning of surgery.  . THE NIGHT BEFORE SURGERY, take your Metformin as usual. Do not take Metformin on day of surgery.          Reviewed and Endorsed by [facility] Patient Education Committee, August 2015   [facility] - Preparing for Surgery Before surgery, you can play an important role.  Because skin is not sterile, your skin needs to be as free of germs as possible.  You can reduce the number of germs on your skin by washing with CHG (chlorahexidine gluconate) soap before surgery.  CHG is an antiseptic cleaner which kills germs and bonds with the skin to continue killing germs even after washing. Please DO NOT use if you have an allergy to CHG or antibacterial soaps.  If your skin becomes reddened/irritated stop using the CHG and inform your nurse when you arrive at Short Stay. Do not shave (including legs and underarms) for at least 48 hours prior to the first CHG shower.  You may shave your face/neck. Please follow these instructions carefully:  1.  Shower with CHG Soap the night before surgery and the  morning of Surgery.  2.  If you choose to wash your hair, wash your hair first as usual with your  normal  shampoo.  3.  After you shampoo, rinse your hair  and body thoroughly to remove the  shampoo.                           4.  Use CHG as you would any other liquid soap.  You can apply chg directly  to the skin and wash                       Gently with a scrungie or clean washcloth.  5.  Apply the CHG Soap to your body ONLY FROM THE NECK DOWN.   Do not use on face/ open                           Wound or open sores. Avoid contact with eyes, ears mouth and genitals (private parts).                       Wash face,  Genitals (private parts) with your normal soap.             6.  Wash thoroughly, paying special attention to the area where your surgery  will be performed.  7.  Thoroughly rinse your body with warm water from the neck down.  8.  DO NOT shower/wash with your normal  soap after using and rinsing off  the CHG Soap.                9.  Pat yourself dry with a clean towel.            10.  Wear clean pajamas.            11.  Place clean sheets on your bed the night of your first shower and do not  sleep with pets. Day of Surgery : Do not apply any lotions/deodorants the morning of surgery.  Please wear clean clothes to the hospital/surgery center.  FAILURE TO FOLLOW THESE INSTRUCTIONS MAY RESULT IN THE CANCELLATION OF YOUR SURGERY PATIENT SIGNATURE_________________________________  NURSE SIGNATURE__________________________________  ________________________________________________________________________

## 2017-01-18 ENCOUNTER — Encounter (HOSPITAL_COMMUNITY): Payer: Self-pay

## 2017-01-18 ENCOUNTER — Encounter (HOSPITAL_COMMUNITY)
Admission: RE | Admit: 2017-01-18 | Discharge: 2017-01-18 | Disposition: A | Discharge status: Home / Self Care | Payer: Medicare Other | Source: Ambulatory Visit | Attending: Urology | Admitting: Urology

## 2017-01-18 ENCOUNTER — Ambulatory Visit (INDEPENDENT_AMBULATORY_CARE_PROVIDER_SITE_OTHER): Payer: Medicare Other | Admitting: Urology

## 2017-01-18 DIAGNOSIS — N2889 Other specified disorders of kidney and ureter: Secondary | ICD-10-CM | POA: Insufficient documentation

## 2017-01-18 DIAGNOSIS — C61 Malignant neoplasm of prostate: Secondary | ICD-10-CM

## 2017-01-18 DIAGNOSIS — C642 Malignant neoplasm of left kidney, except renal pelvis: Secondary | ICD-10-CM | POA: Diagnosis not present

## 2017-01-18 DIAGNOSIS — Z01810 Encounter for preprocedural cardiovascular examination: Secondary | ICD-10-CM | POA: Insufficient documentation

## 2017-01-18 DIAGNOSIS — Z01812 Encounter for preprocedural laboratory examination: Secondary | ICD-10-CM | POA: Insufficient documentation

## 2017-01-18 HISTORY — DX: Cardiac murmur, unspecified: R01.1

## 2017-01-18 LAB — CBC
HCT: 41.3 % (ref 39.0–52.0)
Hemoglobin: 13.4 g/dL (ref 13.0–17.0)
MCH: 26.7 pg (ref 26.0–34.0)
MCHC: 32.4 g/dL (ref 30.0–36.0)
MCV: 82.3 fL (ref 78.0–100.0)
PLATELETS: 223 10*3/uL (ref 150–400)
RBC: 5.02 MIL/uL (ref 4.22–5.81)
RDW: 16.4 % — AB (ref 11.5–15.5)
WBC: 6.7 10*3/uL (ref 4.0–10.5)

## 2017-01-18 LAB — COMPREHENSIVE METABOLIC PANEL
ALK PHOS: 78 U/L (ref 38–126)
ALT: 18 U/L (ref 17–63)
AST: 30 U/L (ref 15–41)
Albumin: 4 g/dL (ref 3.5–5.0)
Anion gap: 6 (ref 5–15)
BUN: 14 mg/dL (ref 6–20)
CHLORIDE: 105 mmol/L (ref 101–111)
CO2: 26 mmol/L (ref 22–32)
CREATININE: 1.08 mg/dL (ref 0.61–1.24)
Calcium: 8.8 mg/dL — ABNORMAL LOW (ref 8.9–10.3)
GFR calc Af Amer: >60 mL/min (ref >60)
GFR calc non Af Amer: >60 mL/min (ref >60)
GLUCOSE: 118 mg/dL — AB (ref 65–99)
Potassium: 4.3 mmol/L (ref 3.5–5.1)
SODIUM: 137 mmol/L (ref 135–145)
Total Bilirubin: 0.7 mg/dL (ref 0.3–1.2)
Total Protein: 8.1 g/dL (ref 6.5–8.1)

## 2017-01-18 LAB — GLUCOSE, CAPILLARY: GLUCOSE-CAPILLARY: 129 mg/dL — AB (ref 65–99)

## 2017-01-18 LAB — ABO/RH: ABO/RH(D): O POS

## 2017-01-19 LAB — HEMOGLOBIN A1C
Hgb A1c MFr Bld: 6.4 % — ABNORMAL HIGH (ref 4.8–5.6)
MEAN PLASMA GLUCOSE: 137 mg/dL

## 2017-01-20 ENCOUNTER — Encounter (HOSPITAL_COMMUNITY): Payer: Self-pay

## 2017-01-20 ENCOUNTER — Encounter (HOSPITAL_COMMUNITY)
Admission: RE | Disposition: A | Discharge status: Home / Self Care | Payer: Self-pay | Source: Ambulatory Visit | Attending: Urology

## 2017-01-20 ENCOUNTER — Inpatient Hospital Stay (HOSPITAL_COMMUNITY): Payer: Medicare Other | Admitting: Anesthesiology

## 2017-01-20 ENCOUNTER — Inpatient Hospital Stay (HOSPITAL_COMMUNITY)
Admission: RE | Admit: 2017-01-20 | Discharge: 2017-01-24 | DRG: 658 | Disposition: A | Discharge status: Home / Self Care | Payer: Medicare Other | Source: Ambulatory Visit | Attending: Urology | Admitting: Urology

## 2017-01-20 ENCOUNTER — Encounter (HOSPITAL_COMMUNITY): Payer: Self-pay | Admitting: Hematology & Oncology

## 2017-01-20 DIAGNOSIS — C642 Malignant neoplasm of left kidney, except renal pelvis: Secondary | ICD-10-CM | POA: Diagnosis present

## 2017-01-20 DIAGNOSIS — N2889 Other specified disorders of kidney and ureter: Secondary | ICD-10-CM | POA: Diagnosis present

## 2017-01-20 DIAGNOSIS — I10 Essential (primary) hypertension: Secondary | ICD-10-CM | POA: Diagnosis present

## 2017-01-20 DIAGNOSIS — E119 Type 2 diabetes mellitus without complications: Secondary | ICD-10-CM | POA: Diagnosis present

## 2017-01-20 DIAGNOSIS — Z8546 Personal history of malignant neoplasm of prostate: Secondary | ICD-10-CM

## 2017-01-20 DIAGNOSIS — C61 Malignant neoplasm of prostate: Secondary | ICD-10-CM

## 2017-01-20 HISTORY — PX: NEPHRECTOMY: SHX65

## 2017-01-20 LAB — GLUCOSE, CAPILLARY
GLUCOSE-CAPILLARY: 107 mg/dL — AB (ref 65–99)
GLUCOSE-CAPILLARY: 151 mg/dL — AB (ref 65–99)
Glucose-Capillary: 75 mg/dL (ref 65–99)

## 2017-01-20 LAB — BASIC METABOLIC PANEL
ANION GAP: 8 (ref 5–15)
BUN: 17 mg/dL (ref 6–20)
CALCIUM: 7.7 mg/dL — AB (ref 8.9–10.3)
CO2: 25 mmol/L (ref 22–32)
Chloride: 103 mmol/L (ref 101–111)
Creatinine, Ser: 0.98 mg/dL (ref 0.61–1.24)
GFR calc Af Amer: >60 mL/min (ref >60)
GLUCOSE: 103 mg/dL — AB (ref 65–99)
POTASSIUM: 4.4 mmol/L (ref 3.5–5.1)
SODIUM: 136 mmol/L (ref 135–145)

## 2017-01-20 LAB — HEMOGLOBIN AND HEMATOCRIT, BLOOD
HEMATOCRIT: 30.7 % — AB (ref 39.0–52.0)
HEMOGLOBIN: 9.8 g/dL — AB (ref 13.0–17.0)

## 2017-01-20 SURGERY — NEPHRECTOMY
Anesthesia: General | Laterality: Left

## 2017-01-20 MED ORDER — MIDAZOLAM HCL 2 MG/2ML IJ SOLN
INTRAMUSCULAR | Status: AC
  Filled 2017-01-20: qty 2

## 2017-01-20 MED ORDER — CEFAZOLIN SODIUM-DEXTROSE 2-4 GM/100ML-% IV SOLN
INTRAVENOUS | Status: AC
  Filled 2017-01-20: qty 100

## 2017-01-20 MED ORDER — FENTANYL CITRATE (PF) 250 MCG/5ML IJ SOLN
INTRAMUSCULAR | Status: AC
  Filled 2017-01-20: qty 5

## 2017-01-20 MED ORDER — SODIUM CHLORIDE 0.9 % IJ SOLN
INTRAMUSCULAR | Status: AC
  Filled 2017-01-20: qty 20

## 2017-01-20 MED ORDER — ATORVASTATIN CALCIUM 40 MG PO TABS
80.0000 mg | ORAL_TABLET | Freq: Every day | ORAL | Status: DC
  Administered 2017-01-20 – 2017-01-23 (×4): 80 mg via ORAL
  Filled 2017-01-20 (×4): qty 2

## 2017-01-20 MED ORDER — LACTATED RINGERS IV SOLN
INTRAVENOUS | Status: DC
  Administered 2017-01-20 (×2): via INTRAVENOUS

## 2017-01-20 MED ORDER — 0.9 % SODIUM CHLORIDE (POUR BTL) OPTIME
TOPICAL | Status: DC | PRN
  Administered 2017-01-20: 3000 mL

## 2017-01-20 MED ORDER — ACETAMINOPHEN 10 MG/ML IV SOLN
INTRAVENOUS | Status: AC
  Filled 2017-01-20: qty 100

## 2017-01-20 MED ORDER — ONDANSETRON HCL 4 MG/2ML IJ SOLN
INTRAMUSCULAR | Status: DC | PRN
  Administered 2017-01-20: 4 mg via INTRAVENOUS

## 2017-01-20 MED ORDER — NALOXONE HCL 0.4 MG/ML IJ SOLN: 0.4000 mg | INTRAMUSCULAR | Status: DC | PRN

## 2017-01-20 MED ORDER — LIDOCAINE HCL (CARDIAC) 20 MG/ML IV SOLN
INTRAVENOUS | Status: DC | PRN
  Administered 2017-01-20: 60 mg via INTRAVENOUS

## 2017-01-20 MED ORDER — ACETAMINOPHEN 10 MG/ML IV SOLN
INTRAVENOUS | Status: DC | PRN
  Administered 2017-01-20: 1000 mg via INTRAVENOUS

## 2017-01-20 MED ORDER — MIDAZOLAM HCL 5 MG/5ML IJ SOLN
INTRAMUSCULAR | Status: DC | PRN
  Administered 2017-01-20 (×2): 1 mg via INTRAVENOUS

## 2017-01-20 MED ORDER — HYDROMORPHONE 1 MG/ML IV SOLN
INTRAVENOUS | Status: DC
  Administered 2017-01-20: 1.2 mg via INTRAVENOUS
  Administered 2017-01-20: 16:00:00 via INTRAVENOUS
  Administered 2017-01-21: 0.2 mg via INTRAVENOUS
  Administered 2017-01-21: 0.4 mg via INTRAVENOUS
  Administered 2017-01-21: 0.6 mg via INTRAVENOUS
  Administered 2017-01-21: 0.8 mg via INTRAVENOUS
  Filled 2017-01-20: qty 25

## 2017-01-20 MED ORDER — KCL IN DEXTROSE-NACL 20-5-0.45 MEQ/L-%-% IV SOLN
INTRAVENOUS | Status: DC
  Administered 2017-01-20 – 2017-01-22 (×6): via INTRAVENOUS
  Filled 2017-01-20 (×8): qty 1000

## 2017-01-20 MED ORDER — OXYCODONE HCL 5 MG PO TABS
5.0000 mg | ORAL_TABLET | ORAL | Status: DC | PRN
  Administered 2017-01-21 (×2): 5 mg via ORAL
  Filled 2017-01-20 (×2): qty 1

## 2017-01-20 MED ORDER — SODIUM CHLORIDE 0.9% FLUSH: 9.0000 mL | INTRAVENOUS | Status: DC | PRN

## 2017-01-20 MED ORDER — PROPOFOL 10 MG/ML IV BOLUS
INTRAVENOUS | Status: AC
  Filled 2017-01-20: qty 20

## 2017-01-20 MED ORDER — PROPOFOL 10 MG/ML IV BOLUS
INTRAVENOUS | Status: DC | PRN
  Administered 2017-01-20: 140 mg via INTRAVENOUS

## 2017-01-20 MED ORDER — EPHEDRINE 5 MG/ML INJ
INTRAVENOUS | Status: AC
  Filled 2017-01-20: qty 10

## 2017-01-20 MED ORDER — FENTANYL CITRATE (PF) 100 MCG/2ML IJ SOLN
INTRAMUSCULAR | Status: DC | PRN
  Administered 2017-01-20: 50 ug via INTRAVENOUS
  Administered 2017-01-20: 100 ug via INTRAVENOUS
  Administered 2017-01-20 (×2): 50 ug via INTRAVENOUS
  Administered 2017-01-20: 100 ug via INTRAVENOUS

## 2017-01-20 MED ORDER — ONDANSETRON HCL 4 MG/2ML IJ SOLN: 4.0000 mg | INTRAMUSCULAR | Status: DC | PRN

## 2017-01-20 MED ORDER — PROMETHAZINE HCL 25 MG/ML IJ SOLN: 6.2500 mg | INTRAMUSCULAR | Status: DC | PRN

## 2017-01-20 MED ORDER — HYDROCODONE-ACETAMINOPHEN 5-325 MG PO TABS: 1.0000 | ORAL_TABLET | Freq: Four times a day (QID) | ORAL | 0 refills | Status: DC | PRN

## 2017-01-20 MED ORDER — DIPHENHYDRAMINE HCL 12.5 MG/5ML PO ELIX: 12.5000 mg | ORAL_SOLUTION | Freq: Four times a day (QID) | ORAL | Status: DC | PRN

## 2017-01-20 MED ORDER — ALBUMIN HUMAN 5 % IV SOLN
12.5000 g | Freq: Once | INTRAVENOUS | Status: AC
  Administered 2017-01-20: 12.5 g via INTRAVENOUS

## 2017-01-20 MED ORDER — PHENYLEPHRINE HCL 10 MG/ML IJ SOLN
INTRAMUSCULAR | Status: DC | PRN
  Administered 2017-01-20 (×6): 40 ug via INTRAVENOUS

## 2017-01-20 MED ORDER — DOCUSATE SODIUM 100 MG PO CAPS
100.0000 mg | ORAL_CAPSULE | Freq: Two times a day (BID) | ORAL | Status: DC
  Administered 2017-01-20 – 2017-01-23 (×7): 100 mg via ORAL
  Filled 2017-01-20 (×7): qty 1

## 2017-01-20 MED ORDER — SUGAMMADEX SODIUM 500 MG/5ML IV SOLN
INTRAVENOUS | Status: DC | PRN
  Administered 2017-01-20: 200 mg via INTRAVENOUS

## 2017-01-20 MED ORDER — HYDROMORPHONE HCL 1 MG/ML IJ SOLN: 0.2500 mg | INTRAMUSCULAR | Status: DC | PRN

## 2017-01-20 MED ORDER — ACETAMINOPHEN 500 MG PO TABS
1000.0000 mg | ORAL_TABLET | Freq: Four times a day (QID) | ORAL | Status: AC
  Administered 2017-01-20 – 2017-01-21 (×4): 1000 mg via ORAL
  Filled 2017-01-20 (×2): qty 2

## 2017-01-20 MED ORDER — SODIUM CHLORIDE 0.9 % IJ SOLN
INTRAMUSCULAR | Status: DC | PRN
  Administered 2017-01-20: 20 mL

## 2017-01-20 MED ORDER — ROCURONIUM BROMIDE 100 MG/10ML IV SOLN
INTRAVENOUS | Status: DC | PRN
  Administered 2017-01-20: 10 mg via INTRAVENOUS
  Administered 2017-01-20: 20 mg via INTRAVENOUS
  Administered 2017-01-20: 50 mg via INTRAVENOUS

## 2017-01-20 MED ORDER — DIPHENHYDRAMINE HCL 50 MG/ML IJ SOLN: 12.5000 mg | Freq: Four times a day (QID) | INTRAMUSCULAR | Status: DC | PRN

## 2017-01-20 MED ORDER — SENNA 8.6 MG PO TABS
1.0000 | ORAL_TABLET | Freq: Two times a day (BID) | ORAL | Status: DC
  Administered 2017-01-20 – 2017-01-23 (×7): 8.6 mg via ORAL
  Filled 2017-01-20 (×7): qty 1

## 2017-01-20 MED ORDER — LACTATED RINGERS IV SOLN
INTRAVENOUS | Status: DC | PRN
  Administered 2017-01-20 (×3): via INTRAVENOUS

## 2017-01-20 MED ORDER — DEXAMETHASONE SODIUM PHOSPHATE 10 MG/ML IJ SOLN
INTRAMUSCULAR | Status: DC | PRN
  Administered 2017-01-20: 8 mg via INTRAVENOUS

## 2017-01-20 MED ORDER — BUPIVACAINE LIPOSOME 1.3 % IJ SUSP
20.0000 mL | Freq: Once | INTRAMUSCULAR | Status: AC
  Administered 2017-01-20: 20 mL
  Filled 2017-01-20: qty 20

## 2017-01-20 MED ORDER — PHENYLEPHRINE 40 MCG/ML (10ML) SYRINGE FOR IV PUSH (FOR BLOOD PRESSURE SUPPORT)
PREFILLED_SYRINGE | INTRAVENOUS | Status: AC
  Filled 2017-01-20: qty 20

## 2017-01-20 MED ORDER — ALBUMIN HUMAN 5 % IV SOLN
INTRAVENOUS | Status: AC
  Filled 2017-01-20: qty 250

## 2017-01-20 MED ORDER — CEFAZOLIN SODIUM-DEXTROSE 2-4 GM/100ML-% IV SOLN
2.0000 g | INTRAVENOUS | Status: DC
  Administered 2017-01-20: 2 g via INTRAVENOUS
  Filled 2017-01-20: qty 100

## 2017-01-20 MED ORDER — SUGAMMADEX SODIUM 200 MG/2ML IV SOLN
INTRAVENOUS | Status: AC
  Filled 2017-01-20: qty 2

## 2017-01-20 MED ORDER — ONDANSETRON HCL 4 MG/2ML IJ SOLN: INTRAMUSCULAR | Status: DC | PRN

## 2017-01-20 SURGICAL SUPPLY — 62 items
ATTRACTOMAT 16X20 MAGNETIC DRP (DRAPES) ×3 IMPLANT
BLADE EXTENDED COATED 6.5IN (ELECTRODE) ×3 IMPLANT
BLADE HEX COATED 2.75 (ELECTRODE) IMPLANT
CATH FOLEY 2WAY SLVR  5CC 16FR (CATHETERS)
CATH FOLEY 2WAY SLVR 5CC 16FR (CATHETERS) IMPLANT
CHLORAPREP W/TINT 26ML (MISCELLANEOUS) ×3 IMPLANT
CLIP LIGATING HEM O LOK PURPLE (MISCELLANEOUS) ×3 IMPLANT
CLIP LIGATING HEMO O LOK GREEN (MISCELLANEOUS) ×3 IMPLANT
CLIP TI LARGE 6 (CLIP) ×9 IMPLANT
CLIP TI MEDIUM 6 (CLIP) ×6 IMPLANT
CLIP TI MEDIUM LARGE 6 (CLIP) ×3 IMPLANT
COVER BACK TABLE 60X90IN (DRAPES) IMPLANT
COVER SURGICAL LIGHT HANDLE (MISCELLANEOUS) ×3 IMPLANT
DECANTER SPIKE VIAL GLASS SM (MISCELLANEOUS) ×3 IMPLANT
DISSECTOR ROUND CHERRY 3/8 STR (MISCELLANEOUS) ×3 IMPLANT
DRAIN CHANNEL 15F RND FF 3/16 (WOUND CARE) IMPLANT
DRAIN PENROSE 18X1/2 LTX STRL (DRAIN) IMPLANT
DRAPE LAPAROSCOPIC ABDOMINAL (DRAPES) ×3 IMPLANT
DRAPE LAPAROTOMY TRNSV 102X78 (DRAPE) IMPLANT
DRAPE UTILITY XL STRL (DRAPES) ×3 IMPLANT
DRAPE WARM FLUID 44X44 (DRAPE) ×3 IMPLANT
DRSG OPSITE POSTOP 4X12 (GAUZE/BANDAGES/DRESSINGS) ×3 IMPLANT
ELECT REM PT RETURN 9FT ADLT (ELECTROSURGICAL) ×3
ELECTRODE REM PT RTRN 9FT ADLT (ELECTROSURGICAL) ×1 IMPLANT
EVACUATOR SILICONE 100CC (DRAIN) IMPLANT
GAUZE SPONGE 4X4 12PLY STRL (GAUZE/BANDAGES/DRESSINGS) IMPLANT
GAUZE SPONGE 4X4 16PLY XRAY LF (GAUZE/BANDAGES/DRESSINGS) ×3 IMPLANT
GLOVE BIOGEL M STRL SZ7.5 (GLOVE) ×3 IMPLANT
GOWN STRL REUS W/TWL LRG LVL3 (GOWN DISPOSABLE) ×9 IMPLANT
HEMOSTAT SURGICEL 2X14 (HEMOSTASIS) ×3 IMPLANT
HEMOSTAT SURGICEL 4X8 (HEMOSTASIS) IMPLANT
KIT BASIN OR (CUSTOM PROCEDURE TRAY) ×3 IMPLANT
LOOP VESSEL MAXI BLUE (MISCELLANEOUS) IMPLANT
NEEDLE HYPO 22GX1.5 SAFETY (NEEDLE) ×3 IMPLANT
NS IRRIG 1000ML POUR BTL (IV SOLUTION) IMPLANT
PACK GENERAL/GYN (CUSTOM PROCEDURE TRAY) ×3 IMPLANT
POSITIONER SURGICAL ARM (MISCELLANEOUS) ×6 IMPLANT
SPONGE LAP 18X18 X RAY DECT (DISPOSABLE) ×3 IMPLANT
SPONGE SURGIFOAM ABS GEL 100 (HEMOSTASIS) IMPLANT
STAPLER VISISTAT 35W (STAPLE) ×3 IMPLANT
SURGIFLO W/THROMBIN 8M KIT (HEMOSTASIS) IMPLANT
SUT ETHILON 3 0 PS 1 (SUTURE) IMPLANT
SUT MON AB 2-0 SH 27 (SUTURE)
SUT MON AB 2-0 SH27 (SUTURE) IMPLANT
SUT PDS AB 1 CTX 36 (SUTURE) ×12 IMPLANT
SUT PDS AB 1 TP1 96 (SUTURE) IMPLANT
SUT SILK 0 (SUTURE) ×4
SUT SILK 0 30XBRD TIE 6 (SUTURE) ×2 IMPLANT
SUT SILK 2 0 (SUTURE)
SUT SILK 2-0 30XBRD TIE 12 (SUTURE) IMPLANT
SUT VIC AB 2-0 SH 27 (SUTURE) ×6
SUT VIC AB 2-0 SH 27X BRD (SUTURE) ×3 IMPLANT
SUT VIC AB 4-0 RB1 27 (SUTURE)
SUT VIC AB 4-0 RB1 27XBRD (SUTURE) IMPLANT
SYRINGE 20CC LL (MISCELLANEOUS) ×6 IMPLANT
TAPE UMBILICAL COTTON 1/8X30 (MISCELLANEOUS) IMPLANT
TOWEL OR 17X26 10 PK STRL BLUE (TOWEL DISPOSABLE) ×3 IMPLANT
TOWEL OR NON WOVEN STRL DISP B (DISPOSABLE) ×3 IMPLANT
TUBING CONNECTING 10 (TUBING) ×2 IMPLANT
TUBING CONNECTING 10' (TUBING) ×1
URINEMETER 200ML W/220 (MISCELLANEOUS) IMPLANT
WATER STERILE IRR 1500ML POUR (IV SOLUTION) IMPLANT

## 2017-01-20 NOTE — Transfer of Care (Signed)
Immediate Anesthesia Transfer of Care Note  Patient: Jon Brooks  Procedure(s) Performed: Procedure(s): OPEN NEPHRECTOMY (Left)  Patient Location: PACU  Anesthesia Type:General  Level of Consciousness: awake, oriented, patient cooperative, lethargic and responds to stimulation  Airway & Oxygen Therapy: Patient Spontanous Breathing and Patient connected to face mask oxygen  Post-op Assessment: Report given to RN, Post -op Vital signs reviewed and stable and Patient moving all extremities  Post vital signs: Reviewed and stable  Last Vitals:  Vitals:   01/20/17 1027  BP: (!) 148/83  Pulse: 64  Resp: 16  Temp: 36.4 C    Last Pain:  Vitals:   01/20/17 1027  TempSrc: Oral         Complications: No apparent anesthesia complications

## 2017-01-20 NOTE — Anesthesia Procedure Notes (Signed)
Procedure Name: Intubation Date/Time: 01/20/2017 12:38 PM Performed by: Myrtie Soman Pre-anesthesia Checklist: Patient identified, Emergency Drugs available, Suction available, Patient being monitored and Timeout performed Patient Re-evaluated:Patient Re-evaluated prior to inductionOxygen Delivery Method: Circle system utilized Preoxygenation: Pre-oxygenation with 100% oxygen Intubation Type: IV induction and Cricoid Pressure applied Ventilation: Mask ventilation without difficulty Laryngoscope Size: Mac and 4 Grade View: Grade III Tube type: Oral Tube size: 7.5 mm Number of attempts: 1 Airway Equipment and Method: Stylet Placement Confirmation: positive ETCO2 and breath sounds checked- equal and bilateral Dental Injury: Teeth and Oropharynx as per pre-operative assessment  Difficulty Due To: Difficulty was anticipated and Difficult Airway- due to anterior larynx Future Recommendations: Recommend- induction with short-acting agent, and alternative techniques readily available Comments: Arytenoids visualized only with head lift/cricoid pressure

## 2017-01-20 NOTE — Anesthesia Preprocedure Evaluation (Signed)
Anesthesia Evaluation  Patient identified by MRN, date of birth, ID band Patient awake    Reviewed: Allergy & Precautions, NPO status , Patient's Chart, lab work & pertinent test results  Airway Mallampati: II  TM Distance: >3 FB Neck ROM: Full    Dental no notable dental hx.    Pulmonary neg pulmonary ROS,    Pulmonary exam normal breath sounds clear to auscultation       Cardiovascular hypertension, Normal cardiovascular exam Rhythm:Regular Rate:Normal     Neuro/Psych negative neurological ROS  negative psych ROS   GI/Hepatic negative GI ROS, Neg liver ROS,   Endo/Other  diabetes  Renal/GU negative Renal ROS  negative genitourinary   Musculoskeletal negative musculoskeletal ROS (+)   Abdominal   Peds negative pediatric ROS (+)  Hematology negative hematology ROS (+)   Anesthesia Other Findings   Reproductive/Obstetrics negative OB ROS                             Anesthesia Physical Anesthesia Plan  ASA: II  Anesthesia Plan: General   Post-op Pain Management:    Induction: Intravenous  Airway Management Planned: Oral ETT  Additional Equipment:   Intra-op Plan:   Post-operative Plan: Extubation in OR  Informed Consent: I have reviewed the patients History and Physical, chart, labs and discussed the procedure including the risks, benefits and alternatives for the proposed anesthesia with the patient or authorized representative who has indicated his/her understanding and acceptance.   Dental advisory given  Plan Discussed with: CRNA and Surgeon  Anesthesia Plan Comments:         Anesthesia Quick Evaluation  

## 2017-01-20 NOTE — H&P (Signed)
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 Cancer [facility])    prostate cancer  . Cataracts, bilateral   . Diabetes ([place])    on metformin per patient., type 2  . Elevated cholesterol   . Glaucoma   . Heart murmur   . Hypertension   . Sarcoidosis ([place])    year 70  . TIA (transient ischemic attack) 06/19/2015   mild, no residual    Past Surgical History:  Procedure Laterality Date  . COLONOSCOPY N/A 05/16/2013   Procedure: COLONOSCOPY;  Surgeon: Daneil Dolin, MD;  Location: [facility];  Service: Endoscopy;  Laterality: N/A;  9:30  . cyst on left shoulder    . MOLE REMOVAL     lower back by dr bradford    Home Medications:  Prescriptions Prior to Admission  Medication Sig Dispense Refill Last Dose  . aspirin 325 MG tablet Take 1 tablet (325 mg total) by mouth daily. (Patient taking differently: Take 325 mg by mouth every evening. ) 30 tablet 5 01/14/2017  . atorvastatin (LIPITOR) 80 MG tablet Take 1 tablet (80 mg total) by mouth daily at 6 PM. 30 tablet 12 01/18/2017  . AZOPT 1 % ophthalmic suspension Place 1 drop into both eyes 2 (two) times daily.    0600  . lisinopril (PRINIVIL,ZESTRIL) 2.5 MG tablet Take 1 tablet (2.5 mg total) by mouth daily. (Patient taking differently: Take 2.5 mg by mouth every evening. ) 30 tablet 12 01/18/2017  . metFORMIN (GLUCOPHAGE) 500 MG tablet Take 1 tablet (500 mg total) by mouth 2 (two) times daily with a meal. (Patient taking differently: Take 500 mg by mouth every evening. ) 60 tablet 5 01/18/2017  . TRAVATAN Z 0.004 % SOLN ophthalmic solution Place 1 drop into both eyes at bedtime.    01/19/2017 at Unknown time   Allergies: No Known Allergies  Family History  Problem Relation Age of Onset  . Liver disease Father     NASH? age 88  . Diabetes Father    . Colon cancer Neg Hx    Social History:  reports that he has never smoked. He has never used smokeless tobacco. He reports that he does not drink alcohol or use drugs.  Review of Systems  All other systems reviewed and are negative.   Physical Exam:  Vital signs in last 24 hours: Temp:  [97.5 F (36.4 C)] 97.5 F (36.4 C) (01/26 1027) Pulse Rate:  [64] 64 (01/26 1027) Resp:  [16] 16 (01/26 1027) BP: (148)/(83) 148/83 (01/26 1027) SpO2:  [100 %] 100 % (01/26 1027) Weight:  [88.9 kg (196 lb)] 88.9 kg (196 lb) (01/26 1015) Physical Exam  Constitutional: He is oriented to person, place, and time. He appears well-developed and well-nourished.  HENT:  Head: Normocephalic and atraumatic.  Eyes: EOM are normal. Pupils are equal, round, and reactive to light.  Neck: Normal range of motion. No thyromegaly present.  Cardiovascular: Normal rate and regular rhythm.   Respiratory: Effort normal. No respiratory distress.  GI: Soft. He exhibits no distension.  Musculoskeletal: Normal range of motion. He exhibits no edema.  Neurological: He is alert and oriented to person, place, and time.  Skin: Skin is warm and dry.  Psychiatric: He has a normal mood and affect. His behavior  is normal. Judgment and thought content normal.    Laboratory Data:  Results for orders placed or performed during the hospital encounter of 01/20/17 (from the past 24 hour(s))  Glucose, capillary     Status: None   Collection Time: 01/20/17  9:51 AM  Result Value Ref Range   Glucose-Capillary 75 65 - 99 mg/dL   Comment 1 Notify RN    No results found for this or any previous visit (from the past 240 hour(s)). Creatinine:  Recent Labs  01/18/17 1416  CREATININE 1.08   Baseline Creatinine: 1.1  Impression/Assessment:  76yo with left renal mass  Plan:  The risks/benefits/alternatives to left open radical nephrectomy was explained to the patient and he understands and wishes to proceed with  surgery  Nicolette Bang 01/20/2017, 12:23 PM

## 2017-01-20 NOTE — Progress Notes (Signed)
Md aware of BP 83/48. Order received to give albumin 5% 250.

## 2017-01-20 NOTE — Anesthesia Postprocedure Evaluation (Addendum)
Anesthesia Post Note  Patient: Jon Brooks  Procedure(s) Performed: Procedure(s) (LRB): OPEN NEPHRECTOMY (Left)  Patient location during evaluation: PACU Anesthesia Type: General Level of consciousness: awake and alert Pain management: pain level controlled Vital Signs Assessment: post-procedure vital signs reviewed and stable Respiratory status: spontaneous breathing, nonlabored ventilation, respiratory function stable and patient connected to nasal cannula oxygen Cardiovascular status: blood pressure returned to baseline and stable Postop Assessment: no signs of nausea or vomiting Anesthetic complications: no       Last Vitals:  Vitals:   01/20/17 1722 01/20/17 1734  BP: 121/61 109/60  Pulse: 64 61  Resp:    Temp:  36.7 C    Last Pain:  Vitals:   01/20/17 1743  TempSrc:   PainSc: Asleep                 Briellah Baik S

## 2017-01-20 NOTE — Discharge Instructions (Signed)

## 2017-01-21 ENCOUNTER — Encounter (HOSPITAL_COMMUNITY): Payer: Self-pay

## 2017-01-21 LAB — BASIC METABOLIC PANEL
Anion gap: 9 (ref 5–15)
BUN: 24 mg/dL — AB (ref 6–20)
CHLORIDE: 102 mmol/L (ref 101–111)
CO2: 21 mmol/L — ABNORMAL LOW (ref 22–32)
CREATININE: 1.55 mg/dL — AB (ref 0.61–1.24)
Calcium: 7.7 mg/dL — ABNORMAL LOW (ref 8.9–10.3)
GFR calc Af Amer: 49 mL/min — ABNORMAL LOW (ref >60)
GFR calc non Af Amer: 42 mL/min — ABNORMAL LOW (ref >60)
Glucose, Bld: 266 mg/dL — ABNORMAL HIGH (ref 65–99)
Potassium: 4.8 mmol/L (ref 3.5–5.1)
SODIUM: 132 mmol/L — AB (ref 135–145)

## 2017-01-21 LAB — GLUCOSE, CAPILLARY
GLUCOSE-CAPILLARY: 203 mg/dL — AB (ref 65–99)
GLUCOSE-CAPILLARY: 147 mg/dL — AB (ref 65–99)
GLUCOSE-CAPILLARY: 173 mg/dL — AB (ref 65–99)
Glucose-Capillary: 193 mg/dL — ABNORMAL HIGH (ref 65–99)

## 2017-01-21 LAB — HEMOGLOBIN AND HEMATOCRIT, BLOOD
HCT: 29.8 % — ABNORMAL LOW (ref 39.0–52.0)
HEMOGLOBIN: 9.4 g/dL — AB (ref 13.0–17.0)

## 2017-01-21 MED ORDER — BRINZOLAMIDE 1 % OP SUSP
1.0000 [drp] | Freq: Every morning | OPHTHALMIC | Status: DC
  Filled 2017-01-21: qty 10

## 2017-01-21 MED ORDER — INSULIN ASPART 100 UNIT/ML ~~LOC~~ SOLN
0.0000 [IU] | Freq: Three times a day (TID) | SUBCUTANEOUS | Status: DC
  Administered 2017-01-21: 3 [IU] via SUBCUTANEOUS
  Administered 2017-01-21 – 2017-01-22 (×4): 2 [IU] via SUBCUTANEOUS
  Administered 2017-01-23: 1 [IU] via SUBCUTANEOUS

## 2017-01-21 MED ORDER — INSULIN ASPART 100 UNIT/ML ~~LOC~~ SOLN: 0.0000 [IU] | Freq: Three times a day (TID) | SUBCUTANEOUS | Status: DC

## 2017-01-21 MED ORDER — BRINZOLAMIDE 1 % OP SUSP
1.0000 [drp] | Freq: Two times a day (BID) | OPHTHALMIC | Status: DC
  Administered 2017-01-21 – 2017-01-23 (×5): 1 [drp] via OPHTHALMIC
  Filled 2017-01-21: qty 10

## 2017-01-21 MED ORDER — OXYCODONE HCL 5 MG PO TABS
5.0000 mg | ORAL_TABLET | ORAL | Status: DC | PRN
  Administered 2017-01-22: 5 mg via ORAL
  Filled 2017-01-21: qty 2
  Filled 2017-01-21 (×2): qty 1

## 2017-01-21 MED ORDER — LATANOPROST 0.005 % OP SOLN
1.0000 [drp] | Freq: Every day | OPHTHALMIC | Status: DC
  Administered 2017-01-21 – 2017-01-23 (×3): 1 [drp] via OPHTHALMIC
  Filled 2017-01-21: qty 2.5

## 2017-01-21 NOTE — Progress Notes (Signed)
Looks good Still some pain  Incision looks good Vitals OK Hb down and stable Advance diet and encourage decrease PCA

## 2017-01-22 LAB — BASIC METABOLIC PANEL
ANION GAP: 6 (ref 5–15)
BUN: 18 mg/dL (ref 6–20)
CHLORIDE: 103 mmol/L (ref 101–111)
CO2: 24 mmol/L (ref 22–32)
Calcium: 8 mg/dL — ABNORMAL LOW (ref 8.9–10.3)
Creatinine, Ser: 1.61 mg/dL — ABNORMAL HIGH (ref 0.61–1.24)
GFR calc Af Amer: 47 mL/min — ABNORMAL LOW (ref >60)
GFR, EST NON AFRICAN AMERICAN: 40 mL/min — AB (ref >60)
Glucose, Bld: 176 mg/dL — ABNORMAL HIGH (ref 65–99)
POTASSIUM: 4.8 mmol/L (ref 3.5–5.1)
Sodium: 133 mmol/L — ABNORMAL LOW (ref 135–145)

## 2017-01-22 LAB — GLUCOSE, CAPILLARY
GLUCOSE-CAPILLARY: 124 mg/dL — AB (ref 65–99)
Glucose-Capillary: 187 mg/dL — ABNORMAL HIGH (ref 65–99)
Glucose-Capillary: 164 mg/dL — ABNORMAL HIGH (ref 65–99)
Glucose-Capillary: 110 mg/dL — ABNORMAL HIGH (ref 65–99)

## 2017-01-22 NOTE — Progress Notes (Signed)
  Pt without complaints. Passing flatus. Tolerating clears.   O: Vitals:   01/21/17 2329 01/22/17 0559  BP: (!) 145/70 (!) 165/78  Pulse: 88 92  Resp: 18 18  Temp: 99.6 F (37.6 C) 99.6 F (37.6 C)    Intake/Output Summary (Last 24 hours) at 01/22/17 1022 Last data filed at 01/22/17 0951  Gross per 24 hour  Intake             3960 ml  Output             3850 ml  Net              110 ml   Looks good, A&Ox3 NAD Resp - reg effort, depth Abd - soft, NT, good BS. Left flank incision c/d/i.  Ext - scd's in place  Foley - urine clear   A/P - POD#2 left radical Nx -  -d/c foley -HLIV -advance diet

## 2017-01-22 NOTE — Op Note (Signed)
Preoperative diagnosis: Left renal mass  Postop diagnosis: Same  Procedure: 1.  Left open radical nephrectomy 2. Left 11th rib resection  Attending: Nicolette Bang  Assistant: Debbrah Alar  Resident: Virginia Crews  Anesthesia: General  Estimated blood loss: 1250 cc  Drains: 16 French Foley catheter  Specimens: Left radical nephrectomy and adrenal 2. Left 11th rib  Antibiotics: Ancef  Findings: 15cm left renal mass with 1 renal artery and 1 renal vein. Tumor appears to extent into ipsilateral adrenal  Indications: Patient is a 76 year old with a history of 15 cm left renal mass.  The mass was not amenable to partial nephrectomy.  After discussing treatment options patient decided to proceed with left laparoscopic radical nephrectomy.  Procedure in detail: Prior to procedure consent was obtained. Patient was brought to the operating room and briefing was done sure correct patient, correct procedure, correct site.  General anesthesia was in administered patient was placed in the right lateral decubitus position.  Her Foley a 39 French catheter was placed.  Her abdomen and flank was then prepped and draped usual sterile fashion.  We made a 20cm incision between the 10th and 11th rib. We then dissected over the 11th rib until the 11th rib was skeletonized. We then removed the diaphragmatic attachments with electrocautery. No violation of the pleura was noted. We then resected the 11th rib and sent it for pathology. We then entered the retroperitoneum. Using blunt dissection we freed the lateral and inferior aspect of the left kidney. We then incised the peritoneum and packed the colon and small bowel medially.  We then freed the upper pole of the kidney. We identified the ureter and traced it to the renal hilum. We identified 1 arteries and 1 veins. We ligated the artery with 0 silk ties and then sharply ligated the artery. We then turned our attention to the vein. The vein was then ligated  with 0 silk ties and then sharply incised.  We then ligated the gonadal vein and the ureter.  Once this was done we then freed the kidney from its lateral and posterior attachments. The specimen was then removed.  Once the specimen was removed we then closed the PDS in aninterrupted fashion.  We then closed the subcutaneous tissues with 2-0 Vicryl in running fashion.  These skin was then closed with staples    This concluded the procedure which well tolerated by the patient. The assistant was needed for this case for retraction and for assistance in ligating parasitic and renal vessels.  Complications: None  Condition: Stable, extubated, transferred to PACU.  Plan: Patient is to be admitted for inpatient stay. The foley will be removed on post operative day 1.

## 2017-01-22 NOTE — Progress Notes (Signed)
Labs ok.

## 2017-01-23 ENCOUNTER — Encounter (HOSPITAL_COMMUNITY): Payer: Self-pay | Admitting: Urology

## 2017-01-23 LAB — GLUCOSE, CAPILLARY
GLUCOSE-CAPILLARY: 107 mg/dL — AB (ref 65–99)
GLUCOSE-CAPILLARY: 129 mg/dL — AB (ref 65–99)
GLUCOSE-CAPILLARY: 128 mg/dL — AB (ref 65–99)
Glucose-Capillary: 95 mg/dL (ref 65–99)

## 2017-01-23 NOTE — Progress Notes (Signed)
3 Days Post-Op Subjective: Patient reports passing flatus, + BM and tolerating PO.  Objective: Vital signs in last 24 hours: Temp:  [98 F (36.7 C)-98.4 F (36.9 C)] 98.4 F (36.9 C) (01/29 2018) Pulse Rate:  [80-87] 80 (01/29 2018) Resp:  [18] 18 (01/29 2018) BP: (136-152)/(58-72) 138/72 (01/29 2018) SpO2:  [97 %-99 %] 97 % (01/29 2018)  Intake/Output from previous day: 01/28 0701 - 01/29 0700 In: 840 [P.O.:840] Out: 2200 [Urine:2200] Intake/Output this shift: Total I/O In: -  Out: 200 [Urine:200]  Physical Exam:  General:alert, cooperative and appears stated age GI: soft and distended Male genitalia: not done Extremities: extremities normal, atraumatic, no cyanosis or edema  Lab Results:  Recent Labs  01/21/17 0535  HGB 9.4*  HCT 29.8*   BMET  Recent Labs  01/21/17 0535 01/22/17 0939  NA 132* 133*  K 4.8 4.8  CL 102 103  CO2 21* 24  GLUCOSE 266* 176*  BUN 24* 18  CREATININE 1.55* 1.61*  CALCIUM 7.7* 8.0*   No results for input(s): LABPT, INR in the last 72 hours. No results for input(s): LABURIN in the last 72 hours. No results found for this or any previous visit.  Studies/Results: No results found.  Assessment/Plan: POD#3 left open nephrectomy  1. Ambulate in halls 2. Aggressive bowel regiment 3. D/c tomorrow   LOS: 3 days   Nicolette Bang 01/23/2017, 10:40 PM

## 2017-01-24 LAB — GLUCOSE, CAPILLARY: Glucose-Capillary: 94 mg/dL (ref 65–99)

## 2017-01-24 LAB — TYPE AND SCREEN
BLOOD PRODUCT EXPIRATION DATE: 201802142359
Blood Product Expiration Date: 201802142359
Unit Type and Rh: 5100
Unit Type and Rh: 5100

## 2017-01-24 NOTE — Care Management Important Message (Signed)
Important Message  Patient Details  Name: Jon Brooks MRN: [id number] Date of Birth: January 17, 1941   Medicare Important Message Given:  Yes    Kerin Salen 01/24/2017, 10:42 AMImportant Message  Patient Details  Name: Jon Brooks MRN: [id number] Date of Birth: [birth year]   Medicare Important Message Given:  Yes    Kerin Salen 01/24/2017, 10:42 AM

## 2017-01-24 NOTE — Care Management Note (Signed)
Case Management Note  Patient Details  Name: Jon Brooks MRN: [id number] Date of Birth: September 16, 1941  Subjective/Objective:     76 y/o m admitted w/L renal mass. From home.  No CM needs or orders.             Action/Plan:d/c home.   Expected Discharge Date:  01/24/17               Expected Discharge Plan:  Home/Self Care  In-House Referral:     Discharge planning Services  CM Consult  Post Acute Care Choice:    Choice offered to:     DME Arranged:    DME Agency:     HH Arranged:    HH Agency:     Status of Service:  Completed, signed off  If discussed at [employer] of Stay Meetings, dates discussed:    Additional Comments:  Dessa Phi, RN 01/24/2017, 9:27 AM

## 2017-01-26 ENCOUNTER — Other Ambulatory Visit: Payer: Self-pay | Admitting: Urology

## 2017-01-26 DIAGNOSIS — C61 Malignant neoplasm of prostate: Secondary | ICD-10-CM

## 2017-01-31 NOTE — Discharge Summary (Signed)
Physician Discharge Summary  Patient ID: Jon Brooks MRN: [id number] DOB/AGE: [birth year] 76 y.o.  Admit date: 01/20/2017 Discharge date: 01/24/2017  Admission Diagnoses: Left renal mass Discharge Diagnoses:  Active Problems:   Left renal mass   Discharged Condition: good  Hospital Course: The patient tolerated the procedure well and was transferred to the floor on IV pain meds, IV fluid. On POD#1 foley was removed, pt was started on clear liquid diet and they ambulated in the halls. On POD#2 the patient was transitioned to a regular diet, IVFs were discontinued, and the patient passed flatus. Prior to discharge the pt was tolerating a regular diet, pain was controlled on PO pain meds, they were ambulating without difficulty, and they had normal bowel function.   Consults: None  Significant Diagnostic Studies: none  Treatments: surgery: left open radical nephrectomy  Discharge Exam: Blood pressure 124/69, pulse 78, temperature 98.3 F (36.8 C), temperature source Oral, resp. rate 18, height 5' 6.5" (1.689 m), weight 88.9 kg (196 lb), SpO2 96 %. General appearance: alert, cooperative and appears stated age Eyes: conjunctivae/corneas clear. PERRL, EOM's intact. Fundi benign. Ears: normal TM's and external ear canals both ears Neck: no adenopathy, no carotid bruit, no JVD, supple, symmetrical, trachea midline and thyroid not enlarged, symmetric, no tenderness/mass/nodules Resp: clear to auscultation bilaterally Cardio: regular rate and rhythm, S1, S2 normal, no murmur, click, rub or gallop GI: soft, non-tender; bowel sounds normal; no masses,  no organomegaly Extremities: extremities normal, atraumatic, no cyanosis or edema Pulses: 2+ and symmetric  Disposition: 01-Home or Self Care  Discharge Instructions    Discharge instructions    Complete by:  As directed    Resume baby aspirin in one week.   Activity:  You are encouraged to ambulate frequently (about every hour during  waking hours) to help prevent blood clots from forming in your legs or lungs.  However, you should not engage in any heavy lifting (> 10-15 lbs), strenuous activity, or straining. Diet: You should advance your diet as instructed by your physician.  It will be normal to have some bloating, nausea, and abdominal discomfort intermittently. Prescriptions:  You will be provided a prescription for pain medication to take as needed.  If your pain is not severe enough to require the prescription pain medication, you may take extra strength Tylenol instead which will have less side effects.  You should also take a prescribed stool softener to avoid straining with bowel movements as the prescription pain medication may constipate you. Incisions: You may remove your dressing bandages 48 hours after surgery if not removed in the hospital.  You will either have some small staples or special tissue glue at each of the incision sites. Once the bandages are removed (if present), the incisions may stay open to air.  You may start showering (but not soaking or bathing in water) the 2nd day after surgery and the incisions simply need to be patted dry after the shower.  No additional care is needed. What to call us about: You should call the office [phone]) if you develop fever > 101 or develop persistent vomiting.   Discharge patient    Complete by:  As directed    Discharge disposition:  01-Home or Self Care   Discharge patient date:  01/24/2017     Allergies as of 01/24/2017   No Known Allergies     Medication List    STOP taking these medications   aspirin 325 MG tablet     TAKE these  medications   atorvastatin 80 MG tablet Commonly known as:  LIPITOR Take 1 tablet (80 mg total) by mouth daily at 6 PM.   AZOPT 1 % ophthalmic suspension Generic drug:  brinzolamide Place 1 drop into both eyes 2 (two) times daily.   HYDROcodone-acetaminophen 5-325 MG tablet Commonly known as:  NORCO Take 1-2 tablets  by mouth every 6 (six) hours as needed for moderate pain or severe pain.   lisinopril 2.5 MG tablet Commonly known as:  PRINIVIL,ZESTRIL Take 1 tablet (2.5 mg total) by mouth daily. What changed:  when to take this   metFORMIN 500 MG tablet Commonly known as:  GLUCOPHAGE Take 1 tablet (500 mg total) by mouth 2 (two) times daily with a meal. What changed:  when to take this   TRAVATAN Z 0.004 % Soln ophthalmic solution Generic drug:  Travoprost (BAK Free) Place 1 drop into both eyes at bedtime.      Follow-up Information    Nicolette Bang, MD Follow up on 02/01/2017.   Specialty:  Urology Why:  at 10:15 Contact information: [address]  [place] [address] [phone]           Signed: Nicolette Bang 01/31/2017, 6:39 AM

## 2017-02-01 ENCOUNTER — Ambulatory Visit: Payer: Managed Care, Other (non HMO) | Admitting: Urology

## 2017-02-01 ENCOUNTER — Ambulatory Visit (HOSPITAL_COMMUNITY): Admission: RE | Admit: 2017-02-01 | Payer: Medicare Other | Source: Ambulatory Visit

## 2017-02-01 ENCOUNTER — Ambulatory Visit (INDEPENDENT_AMBULATORY_CARE_PROVIDER_SITE_OTHER): Payer: Self-pay | Admitting: Urology

## 2017-02-01 DIAGNOSIS — Z9889 Other specified postprocedural states: Secondary | ICD-10-CM

## 2017-02-03 ENCOUNTER — Ambulatory Visit (HOSPITAL_COMMUNITY): Payer: Medicare Other | Admitting: Hematology & Oncology

## 2017-02-08 ENCOUNTER — Ambulatory Visit (INDEPENDENT_AMBULATORY_CARE_PROVIDER_SITE_OTHER): Payer: Medicare Other | Admitting: Urology

## 2017-02-08 DIAGNOSIS — Z9889 Other specified postprocedural states: Secondary | ICD-10-CM | POA: Diagnosis not present

## 2017-02-13 ENCOUNTER — Encounter (HOSPITAL_COMMUNITY): Payer: Self-pay

## 2017-02-13 ENCOUNTER — Encounter (HOSPITAL_COMMUNITY): Payer: Medicare Other | Attending: Oncology | Admitting: Oncology

## 2017-02-13 VITALS — BP 133/63 | HR 76 | Temp 98.1°F | Resp 18 | Wt 178.7 lb

## 2017-02-13 DIAGNOSIS — Z7984 Long term (current) use of oral hypoglycemic drugs: Secondary | ICD-10-CM | POA: Insufficient documentation

## 2017-02-13 DIAGNOSIS — Z7982 Long term (current) use of aspirin: Secondary | ICD-10-CM | POA: Insufficient documentation

## 2017-02-13 DIAGNOSIS — C61 Malignant neoplasm of prostate: Secondary | ICD-10-CM | POA: Insufficient documentation

## 2017-02-13 DIAGNOSIS — C649 Malignant neoplasm of unspecified kidney, except renal pelvis: Secondary | ICD-10-CM | POA: Diagnosis not present

## 2017-02-13 DIAGNOSIS — D869 Sarcoidosis, unspecified: Secondary | ICD-10-CM | POA: Insufficient documentation

## 2017-02-13 DIAGNOSIS — E119 Type 2 diabetes mellitus without complications: Secondary | ICD-10-CM | POA: Insufficient documentation

## 2017-02-13 DIAGNOSIS — Z79899 Other long term (current) drug therapy: Secondary | ICD-10-CM | POA: Insufficient documentation

## 2017-02-13 DIAGNOSIS — N2889 Other specified disorders of kidney and ureter: Secondary | ICD-10-CM | POA: Insufficient documentation

## 2017-02-13 NOTE — Patient Instructions (Signed)
[place] at [facility] Discharge Instructions  RECOMMENDATIONS MADE BY THE CONSULTANT AND ANY TEST RESULTS WILL BE SENT TO YOUR REFERRING PHYSICIAN.  You were seen today by Dr. Barron Schmid CT scan 3 months Follow up in 106months with lab work See Amy up front for appointments   Thank you for choosing [place] at [facility] to provide your oncology and hematology care.  To afford each patient quality time with our provider, please arrive at least 15 minutes before your scheduled appointment time.    If you have a lab appointment with the [facility] please come in thru the  Main Entrance and check in at the main information desk  You need to re-schedule your appointment should you arrive 10 or more minutes late.  We strive to give you quality time with our providers, and arriving late affects you and other patients whose appointments are after yours.  Also, if you no show three or more times for appointments you may be dismissed from the clinic at the providers discretion.     Again, thank you for choosing [facility].  Our hope is that these requests will decrease the amount of time that you wait before being seen by our physicians.       _____________________________________________________________  Should you have questions after your visit to [facility], please contact our office at [phone] between the hours of 8:30 a.m. and 4:30 p.m.  Voicemails left after 4:30 p.m. will not be returned until the following business day.  For prescription refill requests, have your pharmacy contact our office.       Resources For Cancer Patients and their Caregivers ? American Cancer Society: Can assist with transportation, wigs, general needs, runs Look Good Feel Better.        443-517-0684 ? Cancer Care: Provides financial assistance, online support groups, medication/co-pay assistance.  1-800-813-HOPE  6690527953) ? [facility] Assists [place] cancer patients and their families through emotional , educational and financial support.  925-025-7845 ? [place] DSS Where to apply for food stamps, Medicaid and utility assistance. (424)456-9445 ? [facility]: Transportation to medical appointments. (475)664-6220 ? Social Security Administration: May apply for disability if have a Stage IV cancer. 2524912286 (563)447-7379 ? [employer], Disability and Transit Services: Assists with nutrition, care and transit needs. [place] Support Programs: @10RELATIVEDAYS @ > Cancer Support Group  2nd Tuesday of the month 1pm-2pm, Journey Room  > Creative Journey  3rd Tuesday of the month 1130am-1pm, Journey Room  > Look Good Feel Better  1st Wednesday of the month 10am-12 noon, Journey Room (Call Lindsay to register [phone])

## 2017-02-13 NOTE — Progress Notes (Signed)
Progress Note  Jon Brooks OB: 1941-04-19  MR#: [id number]  TDH#:[id number]  Patient Care Team: Sinda Du, MD as PCP - General (Internal Medicine) Daneil Dolin, MD as Attending Physician (Gastroenterology)  CHIEF COMPLAINT:  Chief Complaint  Patient presents with  . Follow-up    VISIT DIAGNOSIS:  Stage III (pT3a, pNx) chromophobe renal cell carcinoma.  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 Tammi Klippel, radiation oncologist (November, 2017) 3.  Remote history of sarcoidosis 4.  History of diabetes  Oncology Flowsheet 05/16/2013 06/19/2015 06/20/2015 01/20/2017  dexamethasone (DECADRON) IJ - - - 8 mg  enoxaparin (LOVENOX) [place] - 40 mg 40 mg -  ondansetron (ZOFRAN) IJ 4 mg - - 4 mg    INTERVAL HISTORY:  76 y.o. gentleman was found to have an enlarged prostate and a biopsy was done. Biopsy revealed high Gleason grade carcinoma of prostate. Patient was referred to Dr Tammi Klippel. Mr. Winski notes that nothing was done regarding his prostate until "things were cleared up" regarding his kidney mass and lymph nodes.  During that evaluation a CT scan revealed left kidney mass with the adjoining nodal mass.  There are multiple other small lymph nodes. He underwent left open radical nephrectomy with Dr. Alyson Ingles at the end of January 2018. Surgical path demonstrated Stage III (pT3a, pNx) chromophobe renal cell carcinoma.  He has been doing well since his kidney surgery, he has had his staples removed. He sees his urologist on 2/28. Denies chest pain, SOB, abdominal pain, or any other concerns.   Review of Systems  Constitutional: Negative.   HENT: Negative.   Eyes: Negative.   Respiratory: Negative.  Negative for shortness of breath.   Cardiovascular: Negative.  Negative for chest pain.  Gastrointestinal: Negative.  Negative for abdominal pain.  Genitourinary: Negative.     Musculoskeletal: Negative.   Skin: Negative.   Neurological: Negative.   Endo/Heme/Allergies: Negative.   Psychiatric/Behavioral: Negative.   All other systems reviewed and are negative. 14 point review of systems was performed and is negative except as detailed under history of present illness and above  PAST MEDICAL HISTORY: Past Medical History:  Diagnosis Date  . Cancer [facility])    prostate cancer  . Cataracts, bilateral   . Diabetes ([place])    on metformin per patient., type 2  . Elevated cholesterol   . Glaucoma   . Heart murmur   . Hypertension   . Sarcoidosis ([place])    year 29  . TIA (transient ischemic attack) 06/19/2015   mild, no residual     PAST SURGICAL HISTORY: Past Surgical History:  Procedure Laterality Date  . COLONOSCOPY N/A 05/16/2013   Procedure: COLONOSCOPY;  Surgeon: Daneil Dolin, MD;  Location: [facility];  Service: Endoscopy;  Laterality: N/A;  9:30  . cyst on left shoulder    . MOLE REMOVAL     lower back by dr Romona Curls  . NEPHRECTOMY Left 01/20/2017   Procedure: OPEN NEPHRECTOMY;  Surgeon: Cleon Gustin, MD;  Location: [facility];  Service: Urology;  Laterality: Left;    FAMILY HISTORY Family History  Problem Relation Age of Onset  . Liver disease Father     NASH? age 30  . Diabetes Father   . Colon cancer Neg Hx      ADVANCED DIRECTIVES:   Patient does not have any living will or healthcare power of attorney.  Information was given .  Available resources had been discussed.  We will follow-up on subsequent appointments regarding this issue   HEALTH MAINTENANCE: Social History  Substance Use Topics  . Smoking status: Never Smoker  . Smokeless tobacco: Never Used  . Alcohol use No    No Known Allergies  Current Outpatient Prescriptions  Medication Sig Dispense Refill  . atorvastatin (LIPITOR) 80 MG tablet Take 1 tablet (80 mg total) by mouth daily at 6 PM. 30 tablet 12  . AZOPT 1 % ophthalmic suspension Place 1 drop into  both eyes 2 (two) times daily.     [name] HYDROcodone-acetaminophen (NORCO) 5-325 MG tablet Take 1-2 tablets by mouth every 6 (six) hours as needed for moderate pain or severe pain. 30 tablet 0  . lisinopril (PRINIVIL,ZESTRIL) 2.5 MG tablet Take 1 tablet (2.5 mg total) by mouth daily. (Patient taking differently: Take 2.5 mg by mouth every evening. ) 30 tablet 12  . metFORMIN (GLUCOPHAGE) 500 MG tablet Take 1 tablet (500 mg total) by mouth 2 (two) times daily with a meal. (Patient taking differently: Take 500 mg by mouth every evening. ) 60 tablet 5  . TRAVATAN Z 0.004 % SOLN ophthalmic solution Place 1 drop into both eyes at bedtime.      No current facility-administered medications for this visit.    Facility-Administered Medications Ordered in Other Visits  Medication Dose Route Frequency Provider Last Rate Last Dose  . magnesium citrate solution 1 Bottle  1 Bottle Oral Once Cleon Gustin, MD        OBJECTIVE:  Vitals:   02/13/17 1550  BP: 133/63  Pulse: 76  Resp: 18  Temp: 98.1 F (36.7 C)     Body mass index is 28.41 kg/m.     Physical Exam  Constitutional: He is oriented to person, place, and time and well-developed, well-nourished, and in no distress.  HENT:  Head: Normocephalic and atraumatic.  Mouth/Throat: Oropharynx is clear and moist.  Eyes: Conjunctivae and EOM are normal. Pupils are equal, round, and reactive to light.  Neck: Normal range of motion. Neck supple.  Cardiovascular: Normal rate, regular rhythm and normal heart sounds.   Pulmonary/Chest: Effort normal and breath sounds normal.  Abdominal: Soft. Bowel sounds are normal.  Surgical incision site healing well, steri strips in place over left lateral abdomen.   Musculoskeletal: Normal range of motion.  Neurological: He is alert and oriented to person, place, and time. Gait normal.  Skin: Skin is warm and dry.  Nursing note and vitals reviewed.  ECOG FS:0 - Asymptomatic  LAB RESULTS:  No visits with  results within 5 Day(s) from this visit.  Latest known visit with results is:  Admission on 01/20/2017, Discharged on 01/24/2017  Component Date Value Ref Range Status  . Glucose-Capillary 01/20/2017 75  65 - 99 mg/dL Final  . Comment 1 01/20/2017 Notify RN   Final  . Hemoglobin 01/20/2017 9.8* 13.0 - 17.0 g/dL Final  . HCT 01/20/2017 30.7* 39.0 - 52.0 % Final  . Sodium 01/20/2017 136  135 - 145 mmol/L Final  . Potassium 01/20/2017 4.4  3.5 - 5.1 mmol/L Final  . Chloride 01/20/2017 103  101 - 111 mmol/L Final  . CO2 01/20/2017 25  22 - 32 mmol/L Final  . Glucose, Bld 01/20/2017 103* 65 - 99 mg/dL Final  . BUN 01/20/2017 17  6 - 20 mg/dL Final  . Creatinine, Ser 01/20/2017 0.98  0.61 - 1.24 mg/dL Final  . Calcium 01/20/2017 7.7* 8.9 - 10.3 mg/dL Final  .  GFR calc non Af Amer 01/20/2017 >60  >60 mL/min Final  . GFR calc Af Amer 01/20/2017 >60  >60 mL/min Final   Comment: (NOTE) The eGFR has been calculated using the CKD EPI equation. This calculation has not been validated in all clinical situations. eGFR's persistently <60 mL/min signify possible Chronic Kidney Disease.   . Anion gap 01/20/2017 8  5 - 15 Final  . Glucose-Capillary 01/20/2017 107* 65 - 99 mg/dL Final  . Comment 1 01/20/2017 Notify RN   Final  . Comment 2 01/20/2017 Document in Chart   Final  . Hemoglobin 01/21/2017 9.4* 13.0 - 17.0 g/dL Final  . HCT 01/21/2017 29.8* 39.0 - 52.0 % Final  . Sodium 01/21/2017 132* 135 - 145 mmol/L Final  . Potassium 01/21/2017 4.8  3.5 - 5.1 mmol/L Final  . Chloride 01/21/2017 102  101 - 111 mmol/L Final  . CO2 01/21/2017 21* 22 - 32 mmol/L Final  . Glucose, Bld 01/21/2017 266* 65 - 99 mg/dL Final  . BUN 01/21/2017 24* 6 - 20 mg/dL Final  . Creatinine, Ser 01/21/2017 1.55* 0.61 - 1.24 mg/dL Final  . Calcium 01/21/2017 7.7* 8.9 - 10.3 mg/dL Final  . GFR calc non Af Amer 01/21/2017 42* >60 mL/min Final  . GFR calc Af Amer 01/21/2017 49* >60 mL/min Final   Comment: (NOTE) The eGFR  has been calculated using the CKD EPI equation. This calculation has not been validated in all clinical situations. eGFR's persistently <60 mL/min signify possible Chronic Kidney Disease.   . Anion gap 01/21/2017 9  5 - 15 Final  . Glucose-Capillary 01/20/2017 151* 65 - 99 mg/dL Final  . Glucose-Capillary 01/21/2017 203* 65 - 99 mg/dL Final  . Glucose-Capillary 01/21/2017 193* 65 - 99 mg/dL Final  . Glucose-Capillary 01/21/2017 147* 65 - 99 mg/dL Final  . Glucose-Capillary 01/21/2017 173* 65 - 99 mg/dL Final  . Glucose-Capillary 01/22/2017 187* 65 - 99 mg/dL Final  . Sodium 01/22/2017 133* 135 - 145 mmol/L Final  . Potassium 01/22/2017 4.8  3.5 - 5.1 mmol/L Final  . Chloride 01/22/2017 103  101 - 111 mmol/L Final  . CO2 01/22/2017 24  22 - 32 mmol/L Final  . Glucose, Bld 01/22/2017 176* 65 - 99 mg/dL Final  . BUN 01/22/2017 18  6 - 20 mg/dL Final  . Creatinine, Ser 01/22/2017 1.61* 0.61 - 1.24 mg/dL Final  . Calcium 01/22/2017 8.0* 8.9 - 10.3 mg/dL Final  . GFR calc non Af Amer 01/22/2017 40* >60 mL/min Final  . GFR calc Af Amer 01/22/2017 47* >60 mL/min Final   Comment: (NOTE) The eGFR has been calculated using the CKD EPI equation. This calculation has not been validated in all clinical situations. eGFR's persistently <60 mL/min signify possible Chronic Kidney Disease.   . Anion gap 01/22/2017 6  5 - 15 Final  . Glucose-Capillary 01/22/2017 164* 65 - 99 mg/dL Final  . Glucose-Capillary 01/22/2017 110* 65 - 99 mg/dL Final  . Glucose-Capillary 01/22/2017 124* 65 - 99 mg/dL Final  . Glucose-Capillary 01/23/2017 107* 65 - 99 mg/dL Final  . Glucose-Capillary 01/23/2017 95  65 - 99 mg/dL Final  . Glucose-Capillary 01/23/2017 129* 65 - 99 mg/dL Final  . Glucose-Capillary 01/23/2017 128* 65 - 99 mg/dL Final  . Glucose-Capillary 01/24/2017 94  65 - 99 mg/dL Final   PATHOLOGY:    RADIOGRAPHIC STUDIES: I have personally reviewed the radiological images as listed and agreed with the  findings in the report.  Initial PET Scan 01/05/2017 IMPRESSION:  1. 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 The masslike extension of this process through the perirenal fascia and along the adjacent peritoneal margin probably represents local extent of tumor. There is a left periaortic lymph node which also has low grade metabolic activity and is likely malignant. Low-grade activity in multiple lymph nodes including a right upper internal mammary lymph node; a lower periaortic lymph node in the chest; the pericardial lymph node; and a gastrohepatic ligament node. These nodes are all mildly enlarged. Although the activity of these nodes is low-grade, so is the activity at the original tumor. There also some scattered mesenteric lymph nodes eccentric to the right which are not overtly hypermetabolic. Particularly the intra-abdominal nodal prominence could be due to reactive lymph nodes from cirrhosis. 2. Enlarged prostate gland with some faintly accentuated metabolic activity in the left lateral peripheral zone. This could reflect prostate cancer. 3. Coarse chronic interstitial lung disease. 4. Densely calcified exophytic mass of the right kidney has internal hypermetabolic activity. 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 The diffuse calcification pattern would be unusual but not impossible for renal cell carcinoma and there is a fatty component along the inferior margin. 5. Other imaging findings of potential clinical significance: Descending and sigmoid colon diverticulosis. Coronary, aortic arch, and branch vessel atherosclerotic vascular disease. Aortoiliac atherosclerotic vascular disease. Left femoral hernia. Cholelithiasis. Nodular contour of the liver, query cirrhosis.  CT Chest w/ Contrast 01/04/2017 IMPRESSION: 1. Partially imaged left  upper quadrant nodal mass. Please refer to CT abdomen pelvis 12/09/2016. 2. Upper and midlung zone predominant interstitial coarsening and mild architectural distortion, nonspecific. Differential diagnosis includes nonspecific interstitial pneumonitis and chronic hypersensitivity pneumonitis. 3. Cirrhosis ([id number]). Mild adenopathy in the lower chest and upper abdomen, as before, likely related. 4. Aortic atherosclerosis (ICD10-170.0). Coronary artery calcification. 5. Cholelithiasis.  ASSESSMENT:  1.  Left chromophobe RCC T3 into perinephric fat s/p left open radical nephrectomy 01/20/17 2.  High Gleason grade carcinoma prostate.  Radiation evaluation has been pending 3.  Remote history of sarcoidosis and diabetes 4. R renal mass  PLAN:    I have reviewed the pathology report with the patient and his wife in detail. Continue observation, no treatment needed at this point.   He will return for follow up in 3 months with labs (CBC, CMP) and a CT chest/abd/pelvis with contrast in 3 months.   Defer management of his prostate cancer at this time to Dr. Tammi Klippel who is giving him the lupron.  Patient expressed understanding and was in agreement with this plan. He also understands that He can call clinic at any time with any questions, concerns, or complaints.   This document serves as a record of services personally performed by Twana First, MD. It was created on her behalf by Martinique Casey, a trained medical scribe. The creation of this record is based on the scribe's personal observations and the provider's statements to them. This document has been checked and approved by the attending provider.  I have reviewed the above documentation for accuracy and completeness and I agree with the above.  Martinique M Casey   02/13/2017 3:55 PM

## 2017-02-22 ENCOUNTER — Ambulatory Visit (INDEPENDENT_AMBULATORY_CARE_PROVIDER_SITE_OTHER): Payer: Medicare Other | Admitting: Urology

## 2017-02-22 DIAGNOSIS — C61 Malignant neoplasm of prostate: Secondary | ICD-10-CM

## 2017-05-10 ENCOUNTER — Encounter (HOSPITAL_COMMUNITY): Payer: Medicare Other | Attending: Oncology

## 2017-05-10 DIAGNOSIS — Z8673 Personal history of transient ischemic attack (TIA), and cerebral infarction without residual deficits: Secondary | ICD-10-CM | POA: Insufficient documentation

## 2017-05-10 DIAGNOSIS — Z905 Acquired absence of kidney: Secondary | ICD-10-CM | POA: Diagnosis not present

## 2017-05-10 DIAGNOSIS — Z79899 Other long term (current) drug therapy: Secondary | ICD-10-CM | POA: Diagnosis not present

## 2017-05-10 DIAGNOSIS — Z7984 Long term (current) use of oral hypoglycemic drugs: Secondary | ICD-10-CM | POA: Insufficient documentation

## 2017-05-10 DIAGNOSIS — C61 Malignant neoplasm of prostate: Secondary | ICD-10-CM | POA: Insufficient documentation

## 2017-05-10 DIAGNOSIS — C649 Malignant neoplasm of unspecified kidney, except renal pelvis: Secondary | ICD-10-CM | POA: Diagnosis not present

## 2017-05-10 DIAGNOSIS — D869 Sarcoidosis, unspecified: Secondary | ICD-10-CM | POA: Insufficient documentation

## 2017-05-10 DIAGNOSIS — E119 Type 2 diabetes mellitus without complications: Secondary | ICD-10-CM | POA: Diagnosis not present

## 2017-05-10 DIAGNOSIS — E7800 Pure hypercholesterolemia, unspecified: Secondary | ICD-10-CM | POA: Insufficient documentation

## 2017-05-10 DIAGNOSIS — I10 Essential (primary) hypertension: Secondary | ICD-10-CM | POA: Diagnosis not present

## 2017-05-10 LAB — COMPREHENSIVE METABOLIC PANEL
ALBUMIN: 3.3 g/dL — AB (ref 3.5–5.0)
ALK PHOS: 99 U/L (ref 38–126)
ALT: 29 U/L (ref 17–63)
ANION GAP: 7 (ref 5–15)
AST: 43 U/L — AB (ref 15–41)
BUN: 24 mg/dL — ABNORMAL HIGH (ref 6–20)
CALCIUM: 8.6 mg/dL — AB (ref 8.9–10.3)
CHLORIDE: 107 mmol/L (ref 101–111)
CO2: 24 mmol/L (ref 22–32)
Creatinine, Ser: 1.24 mg/dL (ref 0.61–1.24)
GFR calc Af Amer: >60 mL/min (ref >60)
GFR calc non Af Amer: 56 mL/min — ABNORMAL LOW (ref >60)
Glucose, Bld: 92 mg/dL (ref 65–99)
Potassium: 4.2 mmol/L (ref 3.5–5.1)
SODIUM: 138 mmol/L (ref 135–145)
Total Bilirubin: 0.6 mg/dL (ref 0.3–1.2)
Total Protein: 8 g/dL (ref 6.5–8.1)

## 2017-05-10 LAB — CBC WITH DIFFERENTIAL/PLATELET
BASOS ABS: 0 10*3/uL (ref 0.0–0.1)
BASOS PCT: 0 %
EOS ABS: 0.3 10*3/uL (ref 0.0–0.7)
Eosinophils Relative: 4 %
HCT: 32.7 % — ABNORMAL LOW (ref 39.0–52.0)
Hemoglobin: 10.1 g/dL — ABNORMAL LOW (ref 13.0–17.0)
LYMPHS ABS: 1.7 10*3/uL (ref 0.7–4.0)
Lymphocytes Relative: 25 %
MCH: 24.4 pg — ABNORMAL LOW (ref 26.0–34.0)
MCHC: 30.9 g/dL (ref 30.0–36.0)
MCV: 79 fL (ref 78.0–100.0)
Monocytes Absolute: 0.7 10*3/uL (ref 0.1–1.0)
Monocytes Relative: 11 %
NEUTROS PCT: 60 %
Neutro Abs: 4.1 10*3/uL (ref 1.7–7.7)
PLATELETS: 224 10*3/uL (ref 150–400)
RBC: 4.14 MIL/uL — AB (ref 4.22–5.81)
RDW: 18.9 % — ABNORMAL HIGH (ref 11.5–15.5)
WBC: 6.9 10*3/uL (ref 4.0–10.5)

## 2017-05-15 ENCOUNTER — Ambulatory Visit (HOSPITAL_COMMUNITY)
Admission: RE | Admit: 2017-05-15 | Discharge: 2017-05-15 | Disposition: A | Discharge status: Home / Self Care | Payer: Medicare Other | Source: Ambulatory Visit | Attending: Oncology | Admitting: Oncology

## 2017-05-15 DIAGNOSIS — C649 Malignant neoplasm of unspecified kidney, except renal pelvis: Secondary | ICD-10-CM | POA: Insufficient documentation

## 2017-05-15 DIAGNOSIS — Z905 Acquired absence of kidney: Secondary | ICD-10-CM | POA: Diagnosis not present

## 2017-05-15 DIAGNOSIS — K8020 Calculus of gallbladder without cholecystitis without obstruction: Secondary | ICD-10-CM | POA: Insufficient documentation

## 2017-05-15 DIAGNOSIS — I2510 Atherosclerotic heart disease of native coronary artery without angina pectoris: Secondary | ICD-10-CM | POA: Diagnosis not present

## 2017-05-15 DIAGNOSIS — I700 Atherosclerosis of aorta: Secondary | ICD-10-CM | POA: Insufficient documentation

## 2017-05-15 DIAGNOSIS — N2889 Other specified disorders of kidney and ureter: Secondary | ICD-10-CM | POA: Insufficient documentation

## 2017-05-15 DIAGNOSIS — R918 Other nonspecific abnormal finding of lung field: Secondary | ICD-10-CM | POA: Diagnosis not present

## 2017-05-15 DIAGNOSIS — K5730 Diverticulosis of large intestine without perforation or abscess without bleeding: Secondary | ICD-10-CM | POA: Diagnosis not present

## 2017-05-15 MED ORDER — IOPAMIDOL (ISOVUE-300) INJECTION 61%
100.0000 mL | Freq: Once | INTRAVENOUS | Status: AC | PRN
  Administered 2017-05-15: 100 mL via INTRAVENOUS

## 2017-05-19 ENCOUNTER — Encounter (HOSPITAL_BASED_OUTPATIENT_CLINIC_OR_DEPARTMENT_OTHER): Payer: Medicare Other | Admitting: Oncology

## 2017-05-19 ENCOUNTER — Encounter (HOSPITAL_COMMUNITY): Payer: Self-pay

## 2017-05-19 VITALS — BP 140/85 | HR 107 | Temp 98.3°F | Resp 18 | Wt 183.8 lb

## 2017-05-19 DIAGNOSIS — C61 Malignant neoplasm of prostate: Secondary | ICD-10-CM

## 2017-05-19 DIAGNOSIS — C649 Malignant neoplasm of unspecified kidney, except renal pelvis: Secondary | ICD-10-CM | POA: Diagnosis present

## 2017-05-19 NOTE — Progress Notes (Signed)
Progress Note  [place] OB: Dec 24, 1941  MR#: [id number]  EEF#:[id number]  Patient Care Team: Jon Du, MD as PCP - General (Internal Medicine) [place] Jon Estimable, MD as Attending Physician (Gastroenterology)  CHIEF COMPLAINT:  No chief complaint on file.   VISIT DIAGNOSIS:  Stage III (pT3a, pNx) chromophobe renal cell carcinoma.  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 Tammi Brooks, radiation oncologist (November, 2017) 3.  Remote history of sarcoidosis 4.  History of diabetes    Chromophobe renal cell carcinoma ([place])   02/13/2017 Initial Diagnosis    Chromophobe renal cell carcinoma ([place])     05/15/2017 Imaging    IMPRESSION: 1. Status post left nephrectomy. No findings to suggest local recurrence of disease and no definite metastatic disease in the chest, abdomen or pelvis. 2. Densely calcified exophytic mass in the lower pole of the right kidney and 2 cm indeterminate lesion in the anterior aspect of the upper pole of the right kidney are both similar to the prior study. These are favored to be benign, but close attention on followup studies is recommended to ensure stability.       HISTORY OF PRESENT ILLNESS:  76 y.o. gentleman was found to have an enlarged prostate and a biopsy was done. Biopsy revealed high Gleason grade carcinoma of prostate. Patient was referred to Dr [place]. [name] notes that nothing was done regarding his prostate until "things were cleared up" regarding his kidney mass and lymph nodes.  During that evaluation a CT scan revealed left kidney mass with the adjoining nodal mass.  There are multiple other small lymph nodes. He underwent left open radical nephrectomy with Dr. Alyson Brooks at the end of January 2018. Surgical path demonstrated Stage III (pT3a, pNx) chromophobe renal cell carcinoma.  INTERVAL HISTORY: Patient presents today with  his wife for continued follow-up. He states he has been doing well and has no complaints. He has not yet started radiation for his prostate cancer, because Dr. Tammi Brooks was waiting for routine to resolve with his renal cell carcinoma in terms of healing from his surgery. He has been started on ADT on his last visit with Dr. Tammi Brooks and he has hot flashes from the ADT but otherwise is tolerating it well. He states he has been eating well and has not had any weight loss. He denies any fatigue, chest pain, shortness of breath, abdominal pain, urinary symptoms, focal weakness.  Review of Systems  Constitutional: Negative.   HENT: Negative.   Eyes: Negative.   Respiratory: Negative.  Negative for shortness of breath.   Cardiovascular: Negative.  Negative for chest pain.  Gastrointestinal: Negative.  Negative for abdominal pain.  Genitourinary: Negative.   Musculoskeletal: Negative.   Skin: Negative.   Neurological: Negative.   Endo/Heme/Allergies: Negative.   Psychiatric/Behavioral: Negative.   All other systems reviewed and are negative. 74 point review of systems was performed and is negative except as detailed under history of present illness and above  PAST MEDICAL HISTORY: Past Medical History:  Diagnosis Date  . Cancer [facility])    prostate cancer  . Cataracts, bilateral   . Diabetes ([place])    on metformin per patient., type 2  . Elevated cholesterol   . Glaucoma   . Heart murmur   . Hypertension   . Sarcoidosis    year 15  . TIA (transient ischemic attack) 06/19/2015   mild, no residual  PAST SURGICAL HISTORY: Past Surgical History:  Procedure Laterality Date  . COLONOSCOPY N/A 05/16/2013   Procedure: COLONOSCOPY;  Surgeon: Jon Dolin, MD;  Location: [facility];  Service: Endoscopy;  Laterality: N/A;  9:30  . cyst on left shoulder    . MOLE REMOVAL     lower back by dr [place]  . NEPHRECTOMY Left 01/20/2017   Procedure: OPEN NEPHRECTOMY;  Surgeon: Jon Gustin, MD;  Location: [facility];  Service: Urology;  Laterality: Left;    FAMILY HISTORY Family History  Problem Relation Age of Onset  . Liver disease Father        NASH? age 47  . Diabetes Father   . Colon cancer Neg Hx      ADVANCED DIRECTIVES:   Patient does not have any living will or healthcare power of attorney.  Information was given .  Available resources had been discussed.  We will follow-up on subsequent appointments regarding this issue   HEALTH MAINTENANCE: Social History  Substance Use Topics  . Smoking status: Never Smoker  . Smokeless tobacco: Never Used  . Alcohol use No    No Known Allergies  Current Outpatient Prescriptions  Medication Sig Dispense Refill  . atorvastatin (LIPITOR) 80 MG tablet Take 1 tablet (80 mg total) by mouth daily at 6 PM. 30 tablet 12  . AZOPT 1 % ophthalmic suspension Place 1 drop into both eyes 2 (two) times daily.     [name] HYDROcodone-acetaminophen (NORCO) 5-325 MG tablet Take 1-2 tablets by mouth every 6 (six) hours as needed for moderate pain or severe pain. 30 tablet 0  . lisinopril (PRINIVIL,ZESTRIL) 2.5 MG tablet Take 1 tablet (2.5 mg total) by mouth daily. (Patient taking differently: Take 2.5 mg by mouth every evening. ) 30 tablet 12  . metFORMIN (GLUCOPHAGE) 500 MG tablet Take 1 tablet (500 mg total) by mouth 2 (two) times daily with a meal. (Patient taking differently: Take 500 mg by mouth every evening. ) 60 tablet 5  . TRAVATAN Z 0.004 % SOLN ophthalmic solution Place 1 drop into both eyes at bedtime.      No current facility-administered medications for this visit.    Facility-Administered Medications Ordered in Other Visits  Medication Dose Route Frequency Provider Last Rate Last Dose  . magnesium citrate solution 1 Bottle  1 Bottle Oral Once Jon Gustin, MD        OBJECTIVE:  Vitals:   05/19/17 1402  BP: 140/85  Pulse: (!) 107  Resp: 18  Temp: 98.3 F (36.8 C)     Body mass index is 29.22 kg/m.      Physical Exam  Constitutional: He is oriented to person, place, and time and well-developed, well-nourished, and in no distress. No distress.  HENT:  Head: Normocephalic and atraumatic.  Mouth/Throat: Oropharynx is clear and moist. No oropharyngeal exudate.  Eyes: Conjunctivae and EOM are normal. Pupils are equal, round, and reactive to light. No scleral icterus.  Neck: Normal range of motion. Neck supple. No JVD present.  Cardiovascular: Normal rate, regular rhythm and normal heart sounds.  Exam reveals no gallop and no friction rub.   No murmur heard. Pulmonary/Chest: Effort normal and breath sounds normal. No respiratory distress. He has no wheezes. He has no rales.  Abdominal: Soft. Bowel sounds are normal. He exhibits no distension. There is no tenderness. There is no guarding.  Musculoskeletal: Normal range of motion. He exhibits no edema or tenderness.  Lymphadenopathy:    He has  no cervical adenopathy.  Neurological: He is alert and oriented to person, place, and time. No cranial nerve deficit. Gait normal.  Skin: Skin is warm and dry. No rash noted. No erythema. No pallor.  Psychiatric: Affect and judgment normal.  Nursing note and vitals reviewed.  ECOG FS:0 - Asymptomatic  LAB RESULTS:  No visits with results within 5 Day(s) from this visit.  Latest known visit with results is:  Appointment on 05/10/2017  Component Date Value Ref Range Status  . WBC 05/10/2017 6.9  4.0 - 10.5 K/uL Final  . RBC 05/10/2017 4.14* 4.22 - 5.81 MIL/uL Final  . Hemoglobin 05/10/2017 10.1* 13.0 - 17.0 g/dL Final  . HCT 05/10/2017 32.7* 39.0 - 52.0 % Final  . MCV 05/10/2017 79.0  78.0 - 100.0 fL Final  . MCH 05/10/2017 24.4* 26.0 - 34.0 pg Final  . MCHC 05/10/2017 30.9  30.0 - 36.0 g/dL Final  . RDW 05/10/2017 18.9* 11.5 - 15.5 % Final  . Platelets 05/10/2017 224  150 - 400 K/uL Final  . Neutrophils Relative % 05/10/2017 60  % Final  . Neutro Abs 05/10/2017 4.1  1.7 - 7.7 K/uL Final  .  Lymphocytes Relative 05/10/2017 25  % Final  . Lymphs Abs 05/10/2017 1.7  0.7 - 4.0 K/uL Final  . Monocytes Relative 05/10/2017 11  % Final  . Monocytes Absolute 05/10/2017 0.7  0.1 - 1.0 K/uL Final  . Eosinophils Relative 05/10/2017 4  % Final  . Eosinophils Absolute 05/10/2017 0.3  0.0 - 0.7 K/uL Final  . Basophils Relative 05/10/2017 0  % Final  . Basophils Absolute 05/10/2017 0.0  0.0 - 0.1 K/uL Final  . Sodium 05/10/2017 138  135 - 145 mmol/L Final  . Potassium 05/10/2017 4.2  3.5 - 5.1 mmol/L Final  . Chloride 05/10/2017 107  101 - 111 mmol/L Final  . CO2 05/10/2017 24  22 - 32 mmol/L Final  . Glucose, Bld 05/10/2017 92  65 - 99 mg/dL Final  . BUN 05/10/2017 24* 6 - 20 mg/dL Final  . Creatinine, Ser 05/10/2017 1.24  0.61 - 1.24 mg/dL Final  . Calcium 05/10/2017 8.6* 8.9 - 10.3 mg/dL Final  . Total Protein 05/10/2017 8.0  6.5 - 8.1 g/dL Final  . Albumin 05/10/2017 3.3* 3.5 - 5.0 g/dL Final  . AST 05/10/2017 43* 15 - 41 U/L Final  . ALT 05/10/2017 29  17 - 63 U/L Final  . Alkaline Phosphatase 05/10/2017 99  38 - 126 U/L Final  . Total Bilirubin 05/10/2017 0.6  0.3 - 1.2 mg/dL Final  . GFR calc non Af Amer 05/10/2017 56* >60 mL/min Final  . GFR calc Af Amer 05/10/2017 >60  >60 mL/min Final   Comment: (NOTE) The eGFR has been calculated using the CKD EPI equation. This calculation has not been validated in all clinical situations. eGFR's persistently <60 mL/min signify possible Chronic Kidney Disease.   . Anion gap 05/10/2017 7  5 - 15 Final   PATHOLOGY:    RADIOGRAPHIC STUDIES: I have personally reviewed the radiological images as listed and agreed with the findings in the report.  Initial PET Scan 01/05/2017 IMPRESSION: 1. 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 The masslike extension of this process through the perirenal fascia and along the adjacent  peritoneal margin probably represents local extent of tumor. There is a left periaortic lymph node which also  has low grade metabolic activity and is likely malignant. Low-grade activity in multiple lymph nodes including a right upper internal mammary lymph node; a lower periaortic lymph node in the chest; the pericardial lymph node; and a gastrohepatic ligament node. These nodes are all mildly enlarged. Although the activity of these nodes is low-grade, so is the activity at the original tumor. There also some scattered mesenteric lymph nodes eccentric to the right which are not overtly hypermetabolic. Particularly the intra-abdominal nodal prominence could be due to reactive lymph nodes from cirrhosis. 2. Enlarged prostate gland with some faintly accentuated metabolic activity in the left lateral peripheral zone. This could reflect prostate cancer. 3. Coarse chronic interstitial lung disease. 4. Densely calcified exophytic mass of the right kidney has internal hypermetabolic activity. 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 The diffuse calcification pattern would be unusual but not impossible for renal cell carcinoma and there is a fatty component along the inferior margin. 5. Other imaging findings of potential clinical significance: Descending and sigmoid colon diverticulosis. Coronary, aortic arch, and branch vessel atherosclerotic vascular disease. Aortoiliac atherosclerotic vascular disease. Left femoral hernia. Cholelithiasis. Nodular contour of the liver, query cirrhosis.  CT Chest w/ Contrast 01/04/2017 IMPRESSION: 1. Partially imaged left upper quadrant nodal mass. Please refer to CT abdomen pelvis 12/09/2016. 2. Upper and midlung zone predominant interstitial coarsening and mild architectural distortion, nonspecific. Differential diagnosis includes nonspecific interstitial pneumonitis and chronic hypersensitivity pneumonitis. 3. Cirrhosis  ([id number]). Mild adenopathy in the lower chest and upper abdomen, as before, likely related. 4. Aortic atherosclerosis (ICD10-170.0). Coronary artery calcification. 5. Cholelithiasis.  ASSESSMENT:  1.  Left chromophobe RCC T3 into perinephric fat s/p left open radical nephrectomy 01/20/17 2.  High Gleason grade carcinoma prostate.   3.  Remote history of sarcoidosis and diabetes 4. R renal mass  PLAN:   Clinically NED. I have reviewed patient's CT chest/abdomen/pelvis in detail with him today. Does not have any evidence of recurrent disease from his RCC. Continue observation at this time. Defer RT treatment for his prostate CA to Dr. Tammi Brooks. He has been started on ADT. Return to clinic in 3 months for follow-up with CBC, CMP. Plan to repeat his CT chest/abdomen/pelvis 6 months from his last imaging in November 2018.  NCCN guidelines for surveillance: H and P every 3-6 months for 3 years, then annually up to 5 years after radical nephrectomy and then clinically as indicated.  CMP and other tests as indicated every 6 months for 2 years then annually up to 5 years after radical nephrectomy done as quickly indicated.  Abdominal imaging: Baseline abdominal CT or MRI within 3-6 months then CT, MRI, every 3-6 months for at least 3 years and then annually up to 5 years.  Baseline CT chest within 3-6 months after radical nephrectomy with continued imaging (CT or chest x-ray) every 3-6 months for at least 3 years and then annually up to 5 years.  Patient expressed understanding and was in agreement with this plan. He also understands that He can call clinic at any time with any questions, concerns, or complaints.   This document serves as a record of services personally performed by Twana First, MD. It was created on her behalf by Martinique Casey, a trained medical scribe. The creation of this record is based on the scribe's personal observations and the provider's statements to them. This document  has been checked and approved by the attending provider.  I have reviewed the above documentation for accuracy and completeness  and I agree with the above.  Twana First, MD   05/19/2017 2:03 PM

## 2017-05-19 NOTE — Patient Instructions (Signed)
[facility] at [facility] Discharge Instructions  RECOMMENDATIONS MADE BY THE CONSULTANT AND ANY TEST RESULTS WILL BE SENT TO YOUR REFERRING PHYSICIAN.  You saw Dr. Zhou today.  Thank you for choosing [facility] at [facility] to provide your oncology and hematology care.  To afford each patient quality time with our provider, please arrive at least 15 minutes before your scheduled appointment time.    If you have a lab appointment with the Cancer Center please come in thru the  Main Entrance and check in at the main information desk  You need to re-schedule your appointment should you arrive 10 or more minutes late.  We strive to give you quality time with our providers, and arriving late affects you and other patients whose appointments are after yours.  Also, if you no show three or more times for appointments you may be dismissed from the clinic at the providers discretion.     Again, thank you for choosing [facility].  Our hope is that these requests will decrease the amount of time that you wait before being seen by our physicians.       _____________________________________________________________  Should you have questions after your visit to [facility], please contact our office at [phone] between the hours of 8:30 a.m. and 4:30 p.m.  Voicemails left after 4:30 p.m. will not be returned until the following business day.  For prescription refill requests, have your pharmacy contact our office.       Resources For Cancer Patients and their Caregivers ? American Cancer Society: Can assist with transportation, wigs, general needs, runs Look Good Feel Better.        1-888-227-6333 ? Cancer Care: Provides financial assistance, online support groups, medication/co-pay assistance.  1-800-813-HOPE (4673) ? [facility] Assists [place] cancer patients and their families through  emotional , educational and financial support.  336-427-4357 ? [place] DSS Where to apply for food stamps, Medicaid and utility assistance. 336-342-1394 ? [facility]: Transportation to medical appointments. 336-347-2287 ? Social Security Administration: May apply for disability if have a Stage IV cancer. 336-342-7796 1-800-772-1213 ? [place] Aging, Disability and Transit Services: Assists with nutrition, care and transit needs. 336-349-2343  Cancer Center Support Programs: @10RELATIVEDAYS@ > Cancer Support Group  2nd Tuesday of the month 1pm-2pm, Journey Room  > Creative Journey  3rd Tuesday of the month 1130am-1pm, Journey Room  > Look Good Feel Better  1st Wednesday of the month 10am-12 noon, Journey Room (Call American Cancer Society to register 1-800-395-5775)    

## 2017-05-24 ENCOUNTER — Ambulatory Visit (INDEPENDENT_AMBULATORY_CARE_PROVIDER_SITE_OTHER): Payer: Medicare Other | Admitting: Urology

## 2017-05-24 DIAGNOSIS — C642 Malignant neoplasm of left kidney, except renal pelvis: Secondary | ICD-10-CM | POA: Diagnosis not present

## 2017-05-24 DIAGNOSIS — C61 Malignant neoplasm of prostate: Secondary | ICD-10-CM | POA: Diagnosis not present

## 2017-05-29 NOTE — Addendum Note (Signed)
Addendum  created 05/29/17 1029 by Breniya Goertzen, MD   Sign clinical note    

## 2017-06-12 ENCOUNTER — Telehealth: Payer: Self-pay | Admitting: Radiation Oncology

## 2017-06-12 NOTE — Telephone Encounter (Addendum)
After several unsuccessful attempts to reach Dr. Alyson Ingles or his nurse I phoned Marlowe Kays in medical records. I faxed the following note to [facility] and she committed to getting it to Dr. Alyson Ingles. Marlowe Kays verbalized that Dr. Alyson Ingles will not be back in the office until Thursday of this week.  Fax confirmation of delivery obtained. Also, provided Romie Jumper with a copy of this note as she is the scheduler for seed implants, spaceoar, etc.

## 2017-06-12 NOTE — Telephone Encounter (Addendum)
I've spoken with the patient. He would like to move forward with treatment here in [place]. So he's had his ADT since December 2017 and will see Dr. Alyson Ingles at the end of August. He really needs to start treatment before October 2018 to avoid possibility of developing castrate resistant disease We will plan to start xrt the first few weeks in September for seed implant as a boost followed by XRT./ So the plan will be: OR trip for- Seed Implant for boost, SpaceOar, and Fiducial Markers 3 weeks later start 5 weeks of XRT to the prostate.  The patient is in agreement and scheduling was notified to start the process.

## 2017-06-12 NOTE — Telephone Encounter (Signed)
-----   Message from Hayden Pedro, PA-C sent at 06/12/2017  9:38 AM EDT ----- Regarding: FW: 06/02/17 Ladies, Can you make sure Dr. Alyson Ingles is aware? I've spoken with the patient. He would like to move forward with treatment here in [place]. So he's had his ADT now, and will see Dr. Alyson Ingles at the end of August. He really needs to start treatment before October, so if we can get him in the first few weeks in September for seed implant that would be best. So the plan will be: OR trip- Seed Implant for boost, SpaceOar, and Fiducial Markers 3 weeks later start 5 weeks of XRT to the prostate.  Thanks, Bryson Ha  ----- Message ----- From: Hayden Pedro, PA-C Sent: 01/02/2017   1:32 PM To: Hayden Pedro, PA-C Subject: 06/02/17                                         ----- Message ----- From: Tyler Pita, MD Sent: 12/16/2016   4:07 PM To: Hayden Pedro, PA-C Subject: RE: 12/08/16                                   I talked with McKenzie,  He also has a big kidney tumor and para-aortic nodes.  McKenzie was confidant he could manage that surgically in January. I suggested we go with hormones only and see how the kidney does.  We could re-eval prostate in 6-8 months??   ----- Message ----- From: Hayden Pedro, PA-C Sent: 12/16/2016   9:18 AM To: Tyler Pita, MD Subject: FW: 12/08/16                                   Looks like his bone scan was indeterminate in a few locations, and CT has some borderline lymphadenopathy. Do you want him to continue moving forward with [place], and if we treat those nodes, does he need to be treated here in [place] for tomo?  ----- Message ----- From: Hayden Pedro, PA-C Sent: 11/22/2016   8:45 AM To: Hayden Pedro, PA-C Subject: 12/08/16                                       Patient seen in consult in [place]:  76 yo man with high risk prostate cancer, Gleason's 4+5 and PSA of 14.1 - pending bone  scan.  If bone scan negative, will proceed with 2 years ADT, external radiation and seed boost:   Dec 2017 - Start ADT with Dr. Alyson Ingles  Feb 2018 - Get 3 gold markers with Dr. Alyson Ingles  Feb 2018 - Get simulation at [facility] with Dr. Tammi Klippel  Feb 2018 - Start 5 weeks external radiation at [facility] with Dr. Tammi Klippel  May 2018 - Get seed implant boost at [facility] with Dr. Orpah Melter  Nov 2019 - Complete ADT with Dr. Alyson Ingles

## 2017-08-18 ENCOUNTER — Encounter (HOSPITAL_COMMUNITY): Payer: Self-pay | Admitting: Oncology

## 2017-08-18 ENCOUNTER — Encounter (HOSPITAL_COMMUNITY): Payer: Medicare Other

## 2017-08-18 ENCOUNTER — Encounter (HOSPITAL_COMMUNITY): Payer: Medicare Other | Attending: Oncology | Admitting: Oncology

## 2017-08-18 VITALS — BP 149/71 | HR 77 | Temp 98.4°F | Resp 18 | Wt 196.9 lb

## 2017-08-18 DIAGNOSIS — C649 Malignant neoplasm of unspecified kidney, except renal pelvis: Secondary | ICD-10-CM | POA: Diagnosis not present

## 2017-08-18 DIAGNOSIS — I10 Essential (primary) hypertension: Secondary | ICD-10-CM

## 2017-08-18 DIAGNOSIS — C61 Malignant neoplasm of prostate: Secondary | ICD-10-CM | POA: Diagnosis present

## 2017-08-18 DIAGNOSIS — E119 Type 2 diabetes mellitus without complications: Secondary | ICD-10-CM | POA: Diagnosis not present

## 2017-08-18 LAB — CBC WITH DIFFERENTIAL/PLATELET
BASOS ABS: 0 10*3/uL (ref 0.0–0.1)
Basophils Relative: 0 %
Eosinophils Absolute: 0.2 10*3/uL (ref 0.0–0.7)
Eosinophils Relative: 4 %
HEMATOCRIT: 36.2 % — AB (ref 39.0–52.0)
Hemoglobin: 11.5 g/dL — ABNORMAL LOW (ref 13.0–17.0)
LYMPHS PCT: 26 %
Lymphs Abs: 1.8 10*3/uL (ref 0.7–4.0)
MCH: 25.4 pg — ABNORMAL LOW (ref 26.0–34.0)
MCHC: 31.8 g/dL (ref 30.0–36.0)
MCV: 80.1 fL (ref 78.0–100.0)
Monocytes Absolute: 0.7 10*3/uL (ref 0.1–1.0)
Monocytes Relative: 11 %
NEUTROS ABS: 4 10*3/uL (ref 1.7–7.7)
Neutrophils Relative %: 59 %
Platelets: 218 10*3/uL (ref 150–400)
RBC: 4.52 MIL/uL (ref 4.22–5.81)
RDW: 19.1 % — ABNORMAL HIGH (ref 11.5–15.5)
WBC: 6.8 10*3/uL (ref 4.0–10.5)

## 2017-08-18 LAB — COMPREHENSIVE METABOLIC PANEL
ALBUMIN: 3.9 g/dL (ref 3.5–5.0)
ALT: 29 U/L (ref 17–63)
ANION GAP: 9 (ref 5–15)
AST: 35 U/L (ref 15–41)
Alkaline Phosphatase: 105 U/L (ref 38–126)
BILIRUBIN TOTAL: 0.7 mg/dL (ref 0.3–1.2)
BUN: 27 mg/dL — ABNORMAL HIGH (ref 6–20)
CO2: 23 mmol/L (ref 22–32)
Calcium: 9.3 mg/dL (ref 8.9–10.3)
Chloride: 107 mmol/L (ref 101–111)
Creatinine, Ser: 1.51 mg/dL — ABNORMAL HIGH (ref 0.61–1.24)
GFR calc Af Amer: 51 mL/min — ABNORMAL LOW (ref >60)
GFR, EST NON AFRICAN AMERICAN: 44 mL/min — AB (ref >60)
GLUCOSE: 129 mg/dL — AB (ref 65–99)
POTASSIUM: 5.4 mmol/L — AB (ref 3.5–5.1)
Sodium: 139 mmol/L (ref 135–145)
TOTAL PROTEIN: 8.4 g/dL — AB (ref 6.5–8.1)

## 2017-08-18 MED ORDER — MECLIZINE HCL 32 MG PO TABS: 32.0000 mg | ORAL_TABLET | Freq: Three times a day (TID) | ORAL | 0 refills | Status: DC | PRN

## 2017-08-18 NOTE — Patient Instructions (Signed)
[facility] at [facility]  Discharge Instructions: You saw Dr. Zhou today.  _______________________________________________________________  Thank you for choosing [facility] at [facility] to provide your oncology and hematology care.  To afford each patient quality time with our providers, please arrive at least 15 minutes before your scheduled appointment.  You need to re-schedule your appointment if you arrive 10 or more minutes late.  We strive to give you quality time with our providers, and arriving late affects you and other patients whose appointments are after yours.  Also, if you no show three or more times for appointments you may be dismissed from the clinic.  Again, thank you for choosing [facility] at [facility]. Our hope is that these requests will allow you access to exceptional care and in a timely manner. _______________________________________________________________  If you have questions after your visit, please contact our office at [phone] between the hours of 8:30 a.m. and 5:00 p.m. Voicemails left after 4:30 p.m. will not be returned until the following business day. _______________________________________________________________  For prescription refill requests, have your pharmacy contact our office. _______________________________________________________________  Recommendations made by the consultant and any test results will be sent to your referring physician. _______________________________________________________________ 

## 2017-08-18 NOTE — Progress Notes (Signed)
Progress Note  Jon Brooks OB: 1941/01/12  MR#: [id number]  GEZ#:[id number]  Patient Care Team: Jon Du, MD as PCP - General (Internal Medicine) Jon Romney Cristopher Estimable, MD as Attending Physician (Gastroenterology)  CHIEF COMPLAINT:  Chief Complaint  Patient presents with  . Follow-up    VISIT DIAGNOSIS:  Stage III (pT3a, pNx) chromophobe renal cell carcinoma.  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  Prostate cancer recently diagnosed with high Gleason grade evaluated by Jon Brooks, radiation oncologist (November, 2017) 3.  Remote history of sarcoidosis 4.  History of diabetes    Chromophobe renal cell carcinoma ([place])   02/13/2017 Initial Diagnosis    Chromophobe renal cell carcinoma ([place])     05/15/2017 Imaging    IMPRESSION: 1. Status post left nephrectomy. No findings to suggest local recurrence of disease and no definite metastatic disease in the chest, abdomen or pelvis. 2. Densely calcified exophytic mass in the lower pole of the right kidney and 2 cm indeterminate lesion in the anterior aspect of the upper pole of the right kidney are both similar to the prior study. These are favored to be benign, but close attention on followup studies is recommended to ensure stability.       HISTORY OF PRESENT ILLNESS:  76 y.o. gentleman was found to have an enlarged prostate and a biopsy was done. Biopsy revealed high Gleason grade carcinoma of prostate. Patient was referred to Dr Jon Brooks. Jon Brooks notes that nothing was done regarding his prostate until "things were cleared up" regarding his kidney mass and lymph nodes.  During that evaluation a CT scan revealed left kidney mass with the adjoining nodal mass.  There are multiple other small lymph nodes. He underwent left open radical nephrectomy with Jon Brooks at the end of January 2018. Surgical path demonstrated Stage III (pT3a, pNx) chromophobe renal cell carcinoma.  INTERVAL  HISTORY: Patient presents today with his wife for continued follow-up. He states he has been doing well. He states he had one episode of vertigo this week. He thought that he had an inner ear problem. He has not had any episodes of vertigo since then. He has been getting Lupron shots for his prostate cancer. He last saw Jon Brooks of urology on 05/26/17. He's been getting Lupron injections. He last received Lupron to 2.5 mg on 05/26/17. Per his recommendations if his CTs remain to show NED from her [place] standpoint he will refer the patient back to Jon Brooks for consideration I am RT for his prostate cancer. He has not been having any urinary symptomes or hematuria.  Review of Systems  Constitutional: Negative.   HENT: Negative.   Eyes: Negative.   Respiratory: Negative.  Negative for shortness of breath.   Cardiovascular: Negative.  Negative for chest pain.  Gastrointestinal: Negative.  Negative for abdominal pain.  Genitourinary: Negative.   Musculoskeletal: Negative.   Skin: Negative.   Neurological: Positive for dizziness.  Endo/Heme/Allergies: Negative.   Psychiatric/Behavioral: Negative.   All other systems reviewed and are negative. 14 point review of systems was performed and is negative except as detailed under history of present illness and above  PAST MEDICAL HISTORY: Past Medical History:  Diagnosis Date  . Cancer [facility])    prostate cancer  . Cataracts, bilateral   . Diabetes ([place])    on metformin per patient., type 2  . Elevated cholesterol   . Glaucoma   . Heart murmur   . Hypertension   . Sarcoidosis  year 15  . TIA (transient ischemic attack) 06/19/2015   mild, no residual     PAST SURGICAL HISTORY: Past Surgical History:  Procedure Laterality Date  . COLONOSCOPY N/A 05/16/2013   Procedure: COLONOSCOPY;  Surgeon: Jon Dolin, MD;  Location: [facility] ENDO SUITE;  Service: Endoscopy;  Laterality: N/A;  9:30  . cyst on left shoulder    . MOLE REMOVAL     lower  back by dr Jon Brooks  . NEPHRECTOMY Left 01/20/2017   Procedure: OPEN NEPHRECTOMY;  Surgeon: Jon Gustin, MD;  Location: [facility] ORS;  Service: Urology;  Laterality: Left;    FAMILY HISTORY Family History  Problem Relation Age of Onset  . Liver disease Father        NASH? age 74  . Diabetes Father   . Colon cancer Neg Hx      ADVANCED DIRECTIVES:   Patient does not have any living will or healthcare power of attorney.  Information was given .  Available resources had been discussed.  We will follow-up on subsequent appointments regarding this issue   HEALTH MAINTENANCE: Social History  Substance Use Topics  . Smoking status: Never Smoker  . Smokeless tobacco: Never Used  . Alcohol use No    No Known Allergies  Current Outpatient Prescriptions  Medication Sig Dispense Refill  . atorvastatin (LIPITOR) 80 MG tablet Take 1 tablet (80 mg total) by mouth daily at 6 PM. 30 tablet 12  . AZOPT 1 % ophthalmic suspension Place 1 drop into both eyes 2 (two) times daily.     [name] HYDROcodone-acetaminophen (NORCO) 5-325 MG tablet Take 1-2 tablets by mouth every 6 (six) hours as needed for moderate pain or severe pain. 30 tablet 0  . lisinopril (PRINIVIL,ZESTRIL) 2.5 MG tablet Take 1 tablet (2.5 mg total) by mouth daily. (Patient taking differently: Take 2.5 mg by mouth every evening. ) 30 tablet 12  . metFORMIN (GLUCOPHAGE) 500 MG tablet Take 1 tablet (500 mg total) by mouth 2 (two) times daily with a meal. (Patient taking differently: Take 500 mg by mouth every evening. ) 60 tablet 5  . TRAVATAN Z 0.004 % SOLN ophthalmic solution Place 1 drop into both eyes at bedtime.     . meclizine (ANTIVERT) 32 MG tablet Take 1 tablet (32 mg total) by mouth 3 (three) times daily as needed. 30 tablet 0   No current facility-administered medications for this visit.    Facility-Administered Medications Ordered in Other Visits  Medication Dose Route Frequency Provider Last Rate Last Dose  . magnesium  citrate solution 1 Bottle  1 Bottle Oral Once Jon Gustin, MD        OBJECTIVE:  Vitals:   08/18/17 1454  BP: (!) 149/71  Pulse: 77  Resp: 18  Temp: 98.4 F (36.9 C)  SpO2: 97%     Body mass index is 31.3 kg/m.     Physical Exam  Constitutional: He is oriented to person, place, and time and well-developed, well-nourished, and in no distress. No distress.  HENT:  Head: Normocephalic and atraumatic.  Mouth/Throat: Oropharynx is clear and moist. No oropharyngeal exudate.  Eyes: Pupils are equal, round, and reactive to light. Conjunctivae and EOM are normal. No scleral icterus.  Neck: Normal range of motion. Neck supple. No JVD present.  Cardiovascular: Normal rate, regular rhythm and normal heart sounds.  Exam reveals no gallop and no friction rub.   No murmur heard. Pulmonary/Chest: Effort normal and breath sounds normal. No respiratory distress. He  has no wheezes. He has no rales.  Abdominal: Soft. Bowel sounds are normal. He exhibits no distension. There is no tenderness. There is no guarding.  Musculoskeletal: Normal range of motion. He exhibits no edema or tenderness.  Lymphadenopathy:    He has no cervical adenopathy.  Neurological: He is alert and oriented to person, place, and time. No cranial nerve deficit. Gait normal.  Skin: Skin is warm and dry. No rash noted. No erythema. No pallor.  Psychiatric: Affect and judgment normal.  Nursing note and vitals reviewed.  ECOG FS:0 - Asymptomatic  LAB RESULTS:  Appointment on 08/18/2017  Component Date Value Ref Range Status  . Sodium 08/18/2017 139  135 - 145 mmol/L Final  . Potassium 08/18/2017 5.4* 3.5 - 5.1 mmol/L Final  . Chloride 08/18/2017 107  101 - 111 mmol/L Final  . CO2 08/18/2017 23  22 - 32 mmol/L Final  . Glucose, Bld 08/18/2017 129* 65 - 99 mg/dL Final  . BUN 08/18/2017 27* 6 - 20 mg/dL Final  . Creatinine, Ser 08/18/2017 1.51* 0.61 - 1.24 mg/dL Final  . Calcium 08/18/2017 9.3  8.9 - 10.3 mg/dL  Final  . Total Protein 08/18/2017 8.4* 6.5 - 8.1 g/dL Final  . Albumin 08/18/2017 3.9  3.5 - 5.0 g/dL Final  . AST 08/18/2017 35  15 - 41 U/L Final  . ALT 08/18/2017 29  17 - 63 U/L Final  . Alkaline Phosphatase 08/18/2017 105  38 - 126 U/L Final  . Total Bilirubin 08/18/2017 0.7  0.3 - 1.2 mg/dL Final  . GFR calc non Af Amer 08/18/2017 44* >60 mL/min Final  . GFR calc Af Amer 08/18/2017 51* >60 mL/min Final   Comment: (NOTE) The eGFR has been calculated using the CKD EPI equation. This calculation has not been validated in all clinical situations. eGFR's persistently <60 mL/min signify possible Chronic Kidney Disease.   . Anion gap 08/18/2017 9  5 - 15 Final  . WBC 08/18/2017 6.8  4.0 - 10.5 K/uL Final  . RBC 08/18/2017 4.52  4.22 - 5.81 MIL/uL Final  . Hemoglobin 08/18/2017 11.5* 13.0 - 17.0 g/dL Final  . HCT 08/18/2017 36.2* 39.0 - 52.0 % Final  . MCV 08/18/2017 80.1  78.0 - 100.0 fL Final  . MCH 08/18/2017 25.4* 26.0 - 34.0 pg Final  . MCHC 08/18/2017 31.8  30.0 - 36.0 g/dL Final  . RDW 08/18/2017 19.1* 11.5 - 15.5 % Final  . Platelets 08/18/2017 218  150 - 400 K/uL Final  . Neutrophils Relative % 08/18/2017 59  % Final  . Neutro Abs 08/18/2017 4.0  1.7 - 7.7 K/uL Final  . Lymphocytes Relative 08/18/2017 26  % Final  . Lymphs Abs 08/18/2017 1.8  0.7 - 4.0 K/uL Final  . Monocytes Relative 08/18/2017 11  % Final  . Monocytes Absolute 08/18/2017 0.7  0.1 - 1.0 K/uL Final  . Eosinophils Relative 08/18/2017 4  % Final  . Eosinophils Absolute 08/18/2017 0.2  0.0 - 0.7 K/uL Final  . Basophils Relative 08/18/2017 0  % Final  . Basophils Absolute 08/18/2017 0.0  0.0 - 0.1 K/uL Final   PATHOLOGY:    RADIOGRAPHIC STUDIES: I have personally reviewed the radiological images as listed and agreed with the findings in the report.  Initial PET Scan 01/05/2017 IMPRESSION: 1. Solid 11.3 by 9.4 cm left renal mass has diffuse fairly low grade hypermetabolic activity with a maximum SUV of  5.1, probably a large renal cell carcinoma. The masslike extension of this  process through the perirenal fascia and along the adjacent peritoneal margin probably represents local extent of tumor. There is a left periaortic lymph node which also has low grade metabolic activity and is likely malignant. Low-grade activity in multiple lymph nodes including a right upper internal mammary lymph node; a lower periaortic lymph node in the chest; the pericardial lymph node; and a gastrohepatic ligament node. These nodes are all mildly enlarged. Although the activity of these nodes is low-grade, so is the activity at the original tumor. There also some scattered mesenteric lymph nodes eccentric to the right which are not overtly hypermetabolic. Particularly the intra-abdominal nodal prominence could be due to reactive lymph nodes from cirrhosis. 2. Enlarged prostate gland with some faintly accentuated metabolic activity in the left lateral peripheral zone. This could reflect prostate cancer. 3. Coarse chronic interstitial lung disease. 4. Densely calcified exophytic mass of the right kidney has internal hypermetabolic activity. 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 The diffuse calcification pattern would be unusual but not impossible for renal cell carcinoma and there is a fatty component along the inferior margin. 5. Other imaging findings of potential clinical significance: Descending and sigmoid colon diverticulosis. Coronary, aortic arch, and branch vessel atherosclerotic vascular disease. Aortoiliac atherosclerotic vascular disease. Left femoral hernia. Cholelithiasis. Nodular contour of the liver, query cirrhosis.  CT Chest w/ Contrast 01/04/2017 IMPRESSION: 1. Partially imaged left upper quadrant nodal mass. Please refer to CT abdomen pelvis 12/09/2016. 2. Upper and midlung zone predominant interstitial coarsening and mild architectural distortion,  nonspecific. Differential diagnosis includes nonspecific interstitial pneumonitis and chronic hypersensitivity pneumonitis. 3. Cirrhosis (DSK87-G81.15). Mild adenopathy in the lower chest and upper abdomen, as before, likely related. 4. Aortic atherosclerosis (ICD10-170.0). Coronary artery calcification. 5. Cholelithiasis.  ASSESSMENT:  1.  Left chromophobe RCC T3 into perinephric fat s/p left open radical nephrectomy 01/20/17 2.  High Gleason grade carcinoma prostate.   3.  Remote history of sarcoidosis and diabetes 4. R renal mass 5. Vertigo  PLAN:   Clinically NED. Continue observation at this time. Defer RT treatment for his prostate CA to Jon Brooks. He has been started on ADT, last dose of lupron on 05/26/17 which is managed by Jon Brooks. Per his recommendations if his CTs remain to show NED from her [place] standpoint he will refer the patient back to Jon Brooks for consideration I am RT for his prostate cancer.  Return to clinic in 3 months for follow-up with CBC, CMP. Plan to repeat his CT chest/abdomen/pelvis prior to next visit.  I have prescribed meclizine PRN for his vertigo symptoms and recommended for his to see his PCP if it persists. Suspect benign positional vertigo.  NCCN guidelines for surveillance: H and P every 3-6 months for 3 years, then annually up to 5 years after radical nephrectomy and then clinically as indicated.  CMP and other tests as indicated every 6 months for 2 years then annually up to 5 years after radical nephrectomy done as quickly indicated.  Abdominal imaging: Baseline abdominal CT or MRI within 3-6 months then CT, MRI, every 3-6 months for at least 3 years and then annually up to 5 years.  Baseline CT chest within 3-6 months after radical nephrectomy with continued imaging (CT or chest x-ray) every 3-6 months for at least 3 years and then annually up to 5 years.  Patient expressed understanding and was in agreement with this plan. He also  understands that He can call clinic at any time with any questions, concerns,  or complaints.     Twana First, MD   08/18/2017 3:39 PM

## 2017-08-21 ENCOUNTER — Other Ambulatory Visit (HOSPITAL_COMMUNITY): Payer: Self-pay | Admitting: *Deleted

## 2017-08-21 DIAGNOSIS — R42 Dizziness and giddiness: Secondary | ICD-10-CM

## 2017-08-21 MED ORDER — MECLIZINE HCL 32 MG PO TABS: 25.0000 mg | ORAL_TABLET | Freq: Three times a day (TID) | ORAL | 1 refills | Status: DC | PRN

## 2017-08-21 MED ORDER — MECLIZINE HCL 25 MG PO TABS: 25.0000 mg | ORAL_TABLET | Freq: Three times a day (TID) | ORAL | 1 refills | Status: DC | PRN

## 2017-08-23 ENCOUNTER — Ambulatory Visit (INDEPENDENT_AMBULATORY_CARE_PROVIDER_SITE_OTHER): Payer: Medicare Other | Admitting: Urology

## 2017-08-23 DIAGNOSIS — C642 Malignant neoplasm of left kidney, except renal pelvis: Secondary | ICD-10-CM

## 2017-08-23 DIAGNOSIS — C61 Malignant neoplasm of prostate: Secondary | ICD-10-CM | POA: Diagnosis not present

## 2017-08-29 ENCOUNTER — Other Ambulatory Visit: Payer: Self-pay | Admitting: Urology

## 2017-08-30 ENCOUNTER — Other Ambulatory Visit: Payer: Self-pay | Admitting: Urology

## 2017-08-30 DIAGNOSIS — C61 Malignant neoplasm of prostate: Secondary | ICD-10-CM

## 2017-09-01 ENCOUNTER — Inpatient Hospital Stay: Admission: RE | Admit: 2017-09-01 | Payer: Self-pay | Source: Ambulatory Visit | Admitting: Radiation Oncology

## 2017-09-01 ENCOUNTER — Ambulatory Visit: Payer: Medicare Other

## 2017-09-01 ENCOUNTER — Telehealth: Payer: Self-pay | Admitting: *Deleted

## 2017-09-01 NOTE — Telephone Encounter (Signed)
Called patient to inform of pre-seed appt. for 09-07-17 @ 9 am, spoke with patient and he is aware of this appt.

## 2017-09-06 ENCOUNTER — Telehealth: Payer: Self-pay | Admitting: *Deleted

## 2017-09-06 NOTE — Progress Notes (Signed)
  Radiation Oncology         [phone] ________________________________  Name: Jon Brooks MRN: [id number]  Date: 09/07/2017  DOB: March 15, 1941  SIMULATION AND TREATMENT PLANNING NOTE PUBIC ARCH STUDY  XT:GGYIRSW, Percell Miller, MD  Sinda Du, MD  DIAGNOSIS: 76 yo man with prostate cancer     ICD-10-CM   1. Prostate cancer ([place]) C61     COMPLEX SIMULATION:  The patient presented today for evaluation for possible prostate seed implant. He was brought to the radiation planning suite and placed supine on the CT couch. A 3-dimensional image study set was obtained in upload to the planning computer. There, on each axial slice, I contoured the prostate gland. Then, using three-dimensional radiation planning tools I reconstructed the prostate in view of the structures from the transperineal needle pathway to assess for possible pubic arch interference. In doing so, I did not appreciate any pubic arch interference. Also, the patient's prostate volume was estimated based on the drawn structure. The volume was 52 cc.  Given the pubic arch appearance and prostate volume, patient remains a good candidate to proceed with prostate seed implant. Today, he freely provided informed written consent to proceed.    PLAN: The patient will undergo prostate seed implant boost, to be followed by external radiotherapy.   ________________________________  Sheral Apley. Tammi Klippel, M.D.

## 2017-09-06 NOTE — Telephone Encounter (Signed)
Called patient to remind of appts. for 09-07-17, no answer will later

## 2017-09-07 ENCOUNTER — Encounter (HOSPITAL_BASED_OUTPATIENT_CLINIC_OR_DEPARTMENT_OTHER)
Admission: RE | Admit: 2017-09-07 | Discharge: 2017-09-07 | Disposition: A | Discharge status: Home / Self Care | Payer: Medicare Other | Source: Ambulatory Visit | Attending: Urology | Admitting: Urology

## 2017-09-07 ENCOUNTER — Ambulatory Visit
Admission: RE | Admit: 2017-09-07 | Discharge: 2017-09-07 | Disposition: A | Discharge status: Home / Self Care | Payer: Medicare Other | Source: Ambulatory Visit | Attending: Radiation Oncology | Admitting: Radiation Oncology

## 2017-09-07 ENCOUNTER — Encounter: Payer: Self-pay | Admitting: Medical Oncology

## 2017-09-07 ENCOUNTER — Ambulatory Visit (HOSPITAL_BASED_OUTPATIENT_CLINIC_OR_DEPARTMENT_OTHER)
Admission: RE | Admit: 2017-09-07 | Discharge: 2017-09-07 | Disposition: A | Discharge status: Home / Self Care | Payer: Medicare Other | Source: Ambulatory Visit | Attending: Urology | Admitting: Urology

## 2017-09-07 DIAGNOSIS — Z510 Encounter for antineoplastic radiation therapy: Secondary | ICD-10-CM | POA: Insufficient documentation

## 2017-09-07 DIAGNOSIS — Z01818 Encounter for other preprocedural examination: Secondary | ICD-10-CM | POA: Diagnosis present

## 2017-09-07 DIAGNOSIS — C61 Malignant neoplasm of prostate: Secondary | ICD-10-CM

## 2017-09-07 DIAGNOSIS — J9811 Atelectasis: Secondary | ICD-10-CM | POA: Diagnosis not present

## 2017-09-07 NOTE — Progress Notes (Signed)
Jon Brooks was seen in [place] late last fall with Dr. Tammi Klippel. He started ADT in December and then had kidney surgery early 2018. He is scheduled for brachytherapy 10/10/17. I met him and his wife for the first time today and introduced myself as the prostate nurse navigator and my role. I gave them my business card and will follow. I asked them to call with questions and or concerns.

## 2017-09-12 ENCOUNTER — Other Ambulatory Visit: Payer: Self-pay | Admitting: Urology

## 2017-09-12 DIAGNOSIS — C61 Malignant neoplasm of prostate: Secondary | ICD-10-CM

## 2017-10-02 ENCOUNTER — Telehealth: Payer: Self-pay | Admitting: *Deleted

## 2017-10-02 NOTE — Telephone Encounter (Signed)
Called patient to remind of labs for 10-03-17 for implant on 10-10-17, spoke with patient and he is aware of this appt.

## 2017-10-03 DIAGNOSIS — Z8673 Personal history of transient ischemic attack (TIA), and cerebral infarction without residual deficits: Secondary | ICD-10-CM | POA: Diagnosis not present

## 2017-10-03 DIAGNOSIS — E785 Hyperlipidemia, unspecified: Secondary | ICD-10-CM | POA: Diagnosis not present

## 2017-10-03 DIAGNOSIS — C61 Malignant neoplasm of prostate: Secondary | ICD-10-CM | POA: Diagnosis present

## 2017-10-03 DIAGNOSIS — I10 Essential (primary) hypertension: Secondary | ICD-10-CM | POA: Diagnosis not present

## 2017-10-03 DIAGNOSIS — Z86010 Personal history of colonic polyps: Secondary | ICD-10-CM | POA: Diagnosis not present

## 2017-10-03 DIAGNOSIS — E1151 Type 2 diabetes mellitus with diabetic peripheral angiopathy without gangrene: Secondary | ICD-10-CM | POA: Diagnosis not present

## 2017-10-03 DIAGNOSIS — Z905 Acquired absence of kidney: Secondary | ICD-10-CM | POA: Diagnosis not present

## 2017-10-03 DIAGNOSIS — H409 Unspecified glaucoma: Secondary | ICD-10-CM | POA: Diagnosis not present

## 2017-10-03 DIAGNOSIS — Z85528 Personal history of other malignant neoplasm of kidney: Secondary | ICD-10-CM | POA: Diagnosis not present

## 2017-10-03 DIAGNOSIS — R011 Cardiac murmur, unspecified: Secondary | ICD-10-CM | POA: Diagnosis not present

## 2017-10-03 LAB — CBC
HEMATOCRIT: 36.7 % — AB (ref 39.0–52.0)
Hemoglobin: 11.5 g/dL — ABNORMAL LOW (ref 13.0–17.0)
MCH: 26 pg (ref 26.0–34.0)
MCHC: 31.3 g/dL (ref 30.0–36.0)
MCV: 82.8 fL (ref 78.0–100.0)
Platelets: 250 10*3/uL (ref 150–400)
RBC: 4.43 MIL/uL (ref 4.22–5.81)
RDW: 17.8 % — AB (ref 11.5–15.5)
WBC: 6.4 10*3/uL (ref 4.0–10.5)

## 2017-10-03 LAB — COMPREHENSIVE METABOLIC PANEL
ALT: 23 U/L (ref 17–63)
AST: 31 U/L (ref 15–41)
Albumin: 4 g/dL (ref 3.5–5.0)
Alkaline Phosphatase: 91 U/L (ref 38–126)
Anion gap: 10 (ref 5–15)
BILIRUBIN TOTAL: 0.5 mg/dL (ref 0.3–1.2)
BUN: 42 mg/dL — AB (ref 6–20)
CO2: 25 mmol/L (ref 22–32)
CREATININE: 1.57 mg/dL — AB (ref 0.61–1.24)
Calcium: 9.5 mg/dL (ref 8.9–10.3)
Chloride: 107 mmol/L (ref 101–111)
GFR, EST AFRICAN AMERICAN: 48 mL/min — AB (ref >60)
GFR, EST NON AFRICAN AMERICAN: 42 mL/min — AB (ref >60)
Glucose, Bld: 104 mg/dL — ABNORMAL HIGH (ref 65–99)
POTASSIUM: 5.6 mmol/L — AB (ref 3.5–5.1)
Sodium: 142 mmol/L (ref 135–145)
TOTAL PROTEIN: 8.9 g/dL — AB (ref 6.5–8.1)

## 2017-10-03 LAB — APTT: aPTT: 36 seconds (ref 24–36)

## 2017-10-03 LAB — PROTIME-INR
INR: 0.88
PROTHROMBIN TIME: 11.8 s (ref 11.4–15.2)

## 2017-10-04 ENCOUNTER — Ambulatory Visit: Payer: Medicare Other | Admitting: Urology

## 2017-10-04 ENCOUNTER — Encounter (HOSPITAL_BASED_OUTPATIENT_CLINIC_OR_DEPARTMENT_OTHER): Payer: Self-pay | Admitting: *Deleted

## 2017-10-06 ENCOUNTER — Encounter (HOSPITAL_BASED_OUTPATIENT_CLINIC_OR_DEPARTMENT_OTHER): Payer: Self-pay | Admitting: *Deleted

## 2017-10-06 NOTE — Progress Notes (Signed)
NPO AFTER MN.  ARRIVE AT 0930.  CURRENT LAB RESULTS, CXR, AND EKG IN CHART AND EPIC.  WILL DO FLEET ENEMA AM DOS.

## 2017-10-09 ENCOUNTER — Telehealth: Payer: Self-pay | Admitting: *Deleted

## 2017-10-09 NOTE — Telephone Encounter (Signed)
CALLED PATIENT TO REMIND OF PROCEDURE FOR 10-10-17, NO ANSWER WILL CALL LATER.

## 2017-10-10 ENCOUNTER — Ambulatory Visit (HOSPITAL_COMMUNITY): Payer: Medicare Other

## 2017-10-10 ENCOUNTER — Ambulatory Visit (HOSPITAL_BASED_OUTPATIENT_CLINIC_OR_DEPARTMENT_OTHER): Payer: Medicare Other | Admitting: Anesthesiology

## 2017-10-10 ENCOUNTER — Ambulatory Visit (HOSPITAL_COMMUNITY): Admission: RE | Admit: 2017-10-10 | Payer: Medicare Other | Source: Ambulatory Visit

## 2017-10-10 ENCOUNTER — Ambulatory Visit (HOSPITAL_BASED_OUTPATIENT_CLINIC_OR_DEPARTMENT_OTHER)
Admission: RE | Admit: 2017-10-10 | Discharge: 2017-10-10 | Disposition: A | Discharge status: Home / Self Care | Payer: Medicare Other | Source: Ambulatory Visit | Attending: Urology | Admitting: Urology

## 2017-10-10 ENCOUNTER — Encounter (HOSPITAL_BASED_OUTPATIENT_CLINIC_OR_DEPARTMENT_OTHER)
Admission: RE | Disposition: A | Discharge status: Home / Self Care | Payer: Self-pay | Source: Ambulatory Visit | Attending: Urology

## 2017-10-10 ENCOUNTER — Encounter (HOSPITAL_BASED_OUTPATIENT_CLINIC_OR_DEPARTMENT_OTHER): Payer: Self-pay | Admitting: Anesthesiology

## 2017-10-10 DIAGNOSIS — Z8673 Personal history of transient ischemic attack (TIA), and cerebral infarction without residual deficits: Secondary | ICD-10-CM | POA: Insufficient documentation

## 2017-10-10 DIAGNOSIS — H409 Unspecified glaucoma: Secondary | ICD-10-CM | POA: Insufficient documentation

## 2017-10-10 DIAGNOSIS — I10 Essential (primary) hypertension: Secondary | ICD-10-CM | POA: Insufficient documentation

## 2017-10-10 DIAGNOSIS — C61 Malignant neoplasm of prostate: Secondary | ICD-10-CM | POA: Diagnosis not present

## 2017-10-10 DIAGNOSIS — R011 Cardiac murmur, unspecified: Secondary | ICD-10-CM | POA: Diagnosis not present

## 2017-10-10 DIAGNOSIS — E1151 Type 2 diabetes mellitus with diabetic peripheral angiopathy without gangrene: Secondary | ICD-10-CM | POA: Diagnosis not present

## 2017-10-10 DIAGNOSIS — Z01818 Encounter for other preprocedural examination: Secondary | ICD-10-CM

## 2017-10-10 DIAGNOSIS — Z85528 Personal history of other malignant neoplasm of kidney: Secondary | ICD-10-CM | POA: Insufficient documentation

## 2017-10-10 DIAGNOSIS — E785 Hyperlipidemia, unspecified: Secondary | ICD-10-CM | POA: Insufficient documentation

## 2017-10-10 DIAGNOSIS — Z8546 Personal history of malignant neoplasm of prostate: Secondary | ICD-10-CM

## 2017-10-10 DIAGNOSIS — Z86010 Personal history of colonic polyps: Secondary | ICD-10-CM | POA: Insufficient documentation

## 2017-10-10 DIAGNOSIS — Z905 Acquired absence of kidney: Secondary | ICD-10-CM | POA: Insufficient documentation

## 2017-10-10 HISTORY — DX: Type 2 diabetes mellitus without complications: E11.9

## 2017-10-10 HISTORY — DX: Personal history of colonic polyps: Z86.010

## 2017-10-10 HISTORY — DX: Personal history of transient ischemic attack (TIA), and cerebral infarction without residual deficits: Z86.73

## 2017-10-10 HISTORY — DX: Hyperlipidemia, unspecified: E78.5

## 2017-10-10 HISTORY — DX: Frequency of micturition: R35.0

## 2017-10-10 HISTORY — DX: Flushing: R23.2

## 2017-10-10 HISTORY — DX: Presence of dental prosthetic device (complete) (partial): Z97.2

## 2017-10-10 HISTORY — DX: Diverticulosis of large intestine without perforation or abscess without bleeding: K57.30

## 2017-10-10 HISTORY — DX: Malignant neoplasm of prostate: C61

## 2017-10-10 HISTORY — DX: Anemia, unspecified: D64.9

## 2017-10-10 HISTORY — PX: CYSTOSCOPY: SHX5120

## 2017-10-10 HISTORY — PX: SPACE OAR INSTILLATION: SHX6769

## 2017-10-10 HISTORY — DX: Unspecified glaucoma: H40.9

## 2017-10-10 HISTORY — PX: RADIOACTIVE SEED IMPLANT: SHX5150

## 2017-10-10 HISTORY — DX: Nocturia: R35.1

## 2017-10-10 HISTORY — DX: Malignant neoplasm of left kidney, except renal pelvis: C64.2

## 2017-10-10 HISTORY — PX: GOLD SEED IMPLANT: SHX6343

## 2017-10-10 LAB — GLUCOSE, CAPILLARY
Glucose-Capillary: 91 mg/dL (ref 65–99)
Glucose-Capillary: 115 mg/dL — ABNORMAL HIGH (ref 65–99)

## 2017-10-10 SURGERY — INSERTION, GOLD SEEDS
Anesthesia: General | Site: Rectum

## 2017-10-10 MED ORDER — FLEET ENEMA 7-19 GM/118ML RE ENEM
1.0000 | ENEMA | Freq: Once | RECTAL | Status: DC
  Filled 2017-10-10: qty 1

## 2017-10-10 MED ORDER — IOHEXOL 300 MG/ML  SOLN
INTRAMUSCULAR | Status: DC | PRN
Start: 2017-10-10 — End: 2017-10-10
  Administered 2017-10-10: 7 mL

## 2017-10-10 MED ORDER — CIPROFLOXACIN IN D5W 400 MG/200ML IV SOLN
INTRAVENOUS | Status: AC
  Filled 2017-10-10: qty 200

## 2017-10-10 MED ORDER — PROPOFOL 10 MG/ML IV BOLUS
INTRAVENOUS | Status: AC
  Filled 2017-10-10: qty 20

## 2017-10-10 MED ORDER — PHENYLEPHRINE HCL 10 MG/ML IJ SOLN
INTRAMUSCULAR | Status: DC | PRN
  Administered 2017-10-10 (×9): 80 ug via INTRAVENOUS

## 2017-10-10 MED ORDER — ONDANSETRON HCL 4 MG/2ML IJ SOLN
4.0000 mg | Freq: Four times a day (QID) | INTRAMUSCULAR | Status: DC | PRN
  Filled 2017-10-10: qty 2

## 2017-10-10 MED ORDER — OXYCODONE HCL 5 MG PO TABS
5.0000 mg | ORAL_TABLET | Freq: Once | ORAL | Status: DC | PRN
  Filled 2017-10-10: qty 1

## 2017-10-10 MED ORDER — CEFAZOLIN SODIUM-DEXTROSE 2-4 GM/100ML-% IV SOLN
2.0000 g | Freq: Once | INTRAVENOUS | Status: DC
  Filled 2017-10-10: qty 100

## 2017-10-10 MED ORDER — PHENYLEPHRINE HCL 10 MG/ML IJ SOLN
INTRAMUSCULAR | Status: AC
  Filled 2017-10-10: qty 1

## 2017-10-10 MED ORDER — GENTAMICIN IN SALINE 1.6-0.9 MG/ML-% IV SOLN
80.0000 mg | Freq: Once | INTRAVENOUS | Status: DC
  Filled 2017-10-10: qty 50

## 2017-10-10 MED ORDER — LIDOCAINE HCL (CARDIAC) 20 MG/ML IV SOLN
INTRAVENOUS | Status: DC | PRN
  Administered 2017-10-10: 30 mg via INTRAVENOUS

## 2017-10-10 MED ORDER — LIDOCAINE 2% (20 MG/ML) 5 ML SYRINGE
INTRAMUSCULAR | Status: AC
  Filled 2017-10-10: qty 5

## 2017-10-10 MED ORDER — FENTANYL CITRATE (PF) 100 MCG/2ML IJ SOLN
INTRAMUSCULAR | Status: DC | PRN
  Administered 2017-10-10: 100 ug via INTRAVENOUS

## 2017-10-10 MED ORDER — CIPROFLOXACIN IN D5W 400 MG/200ML IV SOLN
400.0000 mg | INTRAVENOUS | Status: AC
  Administered 2017-10-10: 400 mg via INTRAVENOUS
  Filled 2017-10-10: qty 200

## 2017-10-10 MED ORDER — ONDANSETRON HCL 4 MG/2ML IJ SOLN
INTRAMUSCULAR | Status: AC
  Filled 2017-10-10: qty 2

## 2017-10-10 MED ORDER — DEXAMETHASONE SODIUM PHOSPHATE 4 MG/ML IJ SOLN
INTRAMUSCULAR | Status: DC | PRN
  Administered 2017-10-10: 10 mg via INTRAVENOUS

## 2017-10-10 MED ORDER — SUCCINYLCHOLINE CHLORIDE 200 MG/10ML IV SOSY
PREFILLED_SYRINGE | INTRAVENOUS | Status: AC
  Filled 2017-10-10: qty 10

## 2017-10-10 MED ORDER — FENTANYL CITRATE (PF) 100 MCG/2ML IJ SOLN
INTRAMUSCULAR | Status: AC
  Filled 2017-10-10: qty 2

## 2017-10-10 MED ORDER — FENTANYL CITRATE (PF) 100 MCG/2ML IJ SOLN
25.0000 ug | INTRAMUSCULAR | Status: DC | PRN
  Filled 2017-10-10: qty 1

## 2017-10-10 MED ORDER — ONDANSETRON HCL 4 MG/2ML IJ SOLN
INTRAMUSCULAR | Status: DC | PRN
  Administered 2017-10-10: 4 mg via INTRAVENOUS

## 2017-10-10 MED ORDER — SODIUM CHLORIDE 0.9 % IJ SOLN
INTRAMUSCULAR | Status: AC
  Filled 2017-10-10: qty 10

## 2017-10-10 MED ORDER — HYDROCODONE-ACETAMINOPHEN 5-325 MG PO TABS: 1.0000 | ORAL_TABLET | Freq: Four times a day (QID) | ORAL | 0 refills | Status: DC | PRN

## 2017-10-10 MED ORDER — DEXAMETHASONE SODIUM PHOSPHATE 10 MG/ML IJ SOLN
INTRAMUSCULAR | Status: AC
  Filled 2017-10-10: qty 1

## 2017-10-10 MED ORDER — PHENYLEPHRINE HCL 10 MG/ML IJ SOLN
INTRAMUSCULAR | Status: DC | PRN
  Administered 2017-10-10: 25 ug/min via INTRAVENOUS

## 2017-10-10 MED ORDER — SODIUM CHLORIDE 0.9 % IV SOLN
INTRAVENOUS | Status: DC | PRN
  Administered 2017-10-10: 80 mg via INTRAVENOUS

## 2017-10-10 MED ORDER — LACTATED RINGERS IV SOLN
INTRAVENOUS | Status: DC
  Administered 2017-10-10 (×3): via INTRAVENOUS
  Filled 2017-10-10: qty 1000

## 2017-10-10 MED ORDER — PROPOFOL 10 MG/ML IV BOLUS
INTRAVENOUS | Status: DC | PRN
  Administered 2017-10-10: 150 mg via INTRAVENOUS
  Administered 2017-10-10 (×2): 50 mg via INTRAVENOUS

## 2017-10-10 MED ORDER — OXYCODONE HCL 5 MG/5ML PO SOLN
5.0000 mg | Freq: Once | ORAL | Status: DC | PRN
  Filled 2017-10-10: qty 5

## 2017-10-10 SURGICAL SUPPLY — 46 items
BAG URINE DRAINAGE (UROLOGICAL SUPPLIES) ×5 IMPLANT
BLADE CLIPPER SURG (BLADE) ×5 IMPLANT
CATH FOLEY 2WAY SLVR  5CC 16FR (CATHETERS) ×4
CATH FOLEY 2WAY SLVR 5CC 16FR (CATHETERS) ×6 IMPLANT
CATH ROBINSON RED A/P 20FR (CATHETERS) ×5 IMPLANT
CLOTH BEACON ORANGE TIMEOUT ST (SAFETY) ×5 IMPLANT
COVER BACK TABLE 60X90IN (DRAPES) ×5 IMPLANT
COVER MAYO STAND STRL (DRAPES) ×5 IMPLANT
DRAPE LG THREE QUARTER DISP (DRAPES) ×5 IMPLANT
DRAPE UNDERBUTTOCKS STRL (DRAPE) ×5 IMPLANT
DRSG TEGADERM 4X4.75 (GAUZE/BANDAGES/DRESSINGS) ×5 IMPLANT
DRSG TEGADERM 8X12 (GAUZE/BANDAGES/DRESSINGS) ×5 IMPLANT
DRSG TELFA 3X8 NADH (GAUZE/BANDAGES/DRESSINGS) ×5 IMPLANT
GAUZE SPONGE 4X4 12PLY STRL LF (GAUZE/BANDAGES/DRESSINGS) ×5 IMPLANT
GLOVE BIO SURGEON STRL SZ8 (GLOVE) ×10 IMPLANT
GLOVE ECLIPSE 8.0 STRL XLNG CF (GLOVE) IMPLANT
GOWN STRL REUS W/ TWL LRG LVL3 (GOWN DISPOSABLE) ×3 IMPLANT
GOWN STRL REUS W/ TWL XL LVL3 (GOWN DISPOSABLE) ×3 IMPLANT
GOWN STRL REUS W/TWL LRG LVL3 (GOWN DISPOSABLE) ×2
GOWN STRL REUS W/TWL XL LVL3 (GOWN DISPOSABLE) ×2
HOLDER FOLEY CATH W/STRAP (MISCELLANEOUS) ×5 IMPLANT
IMPL SPACEOAR SYSTEM 10ML (MISCELLANEOUS) ×3 IMPLANT
IMPLANT SPACEOAR SYSTEM 10ML (MISCELLANEOUS) ×5
IV NS 1000ML (IV SOLUTION) ×2
IV NS 1000ML BAXH (IV SOLUTION) ×3 IMPLANT
KIT RM TURNOVER CYSTO AR (KITS) ×5 IMPLANT
LEGGING LITHOTOMY PAIR STRL (DRAPES) ×5 IMPLANT
MANIFOLD NEPTUNE II (INSTRUMENTS) IMPLANT
MARKER GOLD PRELOAD 1.2X3 (Urological Implant) ×3 IMPLANT
NDL SAFETY ECLIPSE 18X1.5 (NEEDLE) IMPLANT
NEEDLE HYPO 18GX1.5 SHARP (NEEDLE)
NEEDLE SPNL 22GX7 QUINCKE BK (NEEDLE) IMPLANT
PACK CYSTO (CUSTOM PROCEDURE TRAY) ×5 IMPLANT
SEED GOLD PRELOAD 1.2X3 (Urological Implant) ×5 IMPLANT
SURGILUBE 2OZ TUBE FLIPTOP (MISCELLANEOUS) ×5 IMPLANT
SUT BONE WAX W31G (SUTURE) ×5 IMPLANT
SYR 20CC LL (SYRINGE) IMPLANT
SYR CONTROL 10ML LL (SYRINGE) ×10 IMPLANT
SYRINGE 10CC LL (SYRINGE) ×5 IMPLANT
TOWEL OR 17X24 6PK STRL BLUE (TOWEL DISPOSABLE) ×5 IMPLANT
TUBE CONNECTING 12'X1/4 (SUCTIONS)
TUBE CONNECTING 12X1/4 (SUCTIONS) IMPLANT
UNDERPAD 30X30 INCONTINENT (UNDERPADS AND DIAPERS) ×15 IMPLANT
WATER STERILE IRR 3000ML UROMA (IV SOLUTION) ×5 IMPLANT
WATER STERILE IRR 500ML POUR (IV SOLUTION) ×5 IMPLANT
selectSeed1-125 ×5 IMPLANT

## 2017-10-10 NOTE — Discharge Instructions (Signed)
°Post Anesthesia Home Care Instructions ° °Activity: °Get plenty of rest for the remainder of the day. A responsible individual must stay with you for 24 hours following the procedure.  °For the next 24 hours, DO NOT: °-Drive a car °-Operate machinery °-Drink alcoholic beverages °-Take any medication unless instructed by your physician °-Make any legal decisions or sign important papers. ° °Meals: °Start with liquid foods such as gelatin or soup. Progress to regular foods as tolerated. Avoid greasy, spicy, heavy foods. If nausea and/or vomiting occur, drink only clear liquids until the nausea and/or vomiting subsides. Call your physician if vomiting continues. ° °Special Instructions/Symptoms: °Your throat may feel dry or sore from the anesthesia or the breathing tube placed in your throat during surgery. If this causes discomfort, gargle with warm salt water. The discomfort should disappear within 24 hours. ° °If you had a scopolamine patch placed behind your ear for the management of post- operative nausea and/or vomiting: ° °1. The medication in the patch is effective for 72 hours, after which it should be removed.  Wrap patch in a tissue and discard in the trash. Wash hands thoroughly with soap and water. °2. You may remove the patch earlier than 72 hours if you experience unpleasant side effects which may include dry mouth, dizziness or visual disturbances. °3. Avoid touching the patch. Wash your hands with soap and water after contact with the patch. °  °Indwelling Urinary Catheter Care, Adult °Take good care of your catheter to keep it working and to prevent problems. °How to wear your catheter °Attach your catheter to your leg with tape (adhesive tape) or a leg strap. Make sure it is not too tight. If you use tape, remove any bits of tape that are already on the catheter. °How to wear a drainage bag °You should have: °· A large overnight bag. °· A small leg bag. ° °Overnight Bag °You may wear the overnight  bag at any time. Always keep the bag below the level of your bladder but off the floor. When you sleep, put a clean plastic bag in a wastebasket. Then hang the bag inside the wastebasket. °Leg Bag °Never wear the leg bag at night. Always wear the leg bag below your knee. Keep the leg bag secure with a leg strap or tape. °How to care for your skin °· Clean the skin around the catheter at least once every day. °· Shower every day. Do not take baths. °· Put creams, lotions, or ointments on your genital area only as told by your doctor. °· Do not use powders, sprays, or lotions on your genital area. °How to clean your catheter and your skin °1. Wash your hands with soap and water. °2. Wet a washcloth in warm water and gentle (mild) soap. °3. Use the washcloth to clean the skin where the catheter enters your body. Clean downward and wipe away from the catheter in small circles. Do not wipe toward the catheter. °4. Pat the area dry with a clean towel. Make sure to clean off all soap. °How to care for your drainage bags °Empty your drainage bag when it is ?-½ full or at least 2-3 times a day. Replace your drainage bag once a month or sooner if it starts to smell bad or look dirty. Do not clean your drainage bag unless told by your doctor. °Emptying a drainage bag ° °Supplies Needed °· Rubbing alcohol. °· Gauze pad or cotton ball. °· Tape or a leg strap. ° °Steps °  1. Wash your hands with soap and water. °2. Separate (detach) the bag from your leg. °3. Hold the bag over the toilet or a clean container. Keep the bag below your hips and bladder. This stops pee (urine) from going back into the tube. °4. Open the pour spout at the bottom of the bag. °5. Empty the pee into the toilet or container. Do not let the pour spout touch any surface. °6. Put rubbing alcohol on a gauze pad or cotton ball. °7. Use the gauze pad or cotton ball to clean the pour spout. °8. Close the pour spout. °9. Attach the bag to your leg with tape or a  leg strap. °10. Wash your hands. ° °Changing a drainage bag °Supplies Needed °· Alcohol wipes. °· A clean drainage bag. °· Adhesive tape or a leg strap. ° °Steps °1. Wash your hands with soap and water. °2. Separate the dirty bag from your leg. °3. Pinch the rubber catheter with your fingers so that pee does not spill out. °4. Separate the catheter tube from the drainage tube where these tubes connect (at the connection valve). Do not let the tubes touch any surface. °5. Clean the end of the catheter tube with an alcohol wipe. Use a different alcohol wipe to clean the end of the drainage tube. °6. Connect the catheter tube to the drainage tube of the clean bag. °7. Attach the new bag to the leg with adhesive tape or a leg strap. °8. Wash your hands. ° °How to prevent infection and other problems °· Never pull on your catheter or try to remove it. Pulling can damage tissue in your body. °· Always wash your hands before and after touching your catheter. °· If a leg strap gets wet, replace it with a dry one. °· Drink enough fluids to keep your pee clear or pale yellow, or as told by your doctor. °· Do not let the drainage bag or tubing touch the floor. °· Wear cotton underwear. °· If you are male, wipe from front to back after you poop (have a bowel movement). °· Check on the catheter often to make sure it works and the tubing is not twisted. °Get help if: °· Your pee is cloudy. °· Your pee smells unusually bad. °· Your pee is not draining into the bag. °· Your tube gets clogged. °· Your catheter starts to leak. °· Your bladder feels full. °Get help right away if: °· You have redness, swelling, or pain where the catheter enters your body. °· You have fluid, pus, or a bad smell coming from the area where the catheter enters your body. °· The area where the catheter enters your body feels warm. °· You have a fever. °· You have pain in your: °? Stomach (abdomen). °? Legs. °? Lower back. °? Bladder. °· You see blood fill  the catheter. °· Your pee is pink or red. °· You feel sick to your stomach (nauseous). °· You throw up (vomit). °· You have chills. °· Your catheter gets pulled out. °This information is not intended to replace advice given to you by your health care provider. Make sure you discuss any questions you have with your health care provider. °Document Released: 04/08/2013 Document Revised: 11/09/2016 Document Reviewed: 05/27/2014 °[employer]vier Interactive Patient Education © 2018 [employer] ° °

## 2017-10-10 NOTE — Op Note (Signed)
PRE-OPERATIVE DIAGNOSIS:  Adenocarcinoma of the prostate  POST-OPERATIVE DIAGNOSIS:  Same  PROCEDURE:  Procedure(s): 1. I-125 radioactive seed implantation 2. Cystoscopy 3. Placement of SpaceOAR  SURGEON:  Surgeon(s): Nicolette Bang, MD  Radiation oncologist: Dr. Tyler Pita  ANESTHESIA:  General  EBL:  Minimal  DRAINS: 53 French Foley catheter  INDICATION: Jon Brooks is a 76 year old with a history of T2a prostate cancer. After discussing treatment options he has elected to proceed with brachytherapy  Description of procedure: After informed consent the patient was brought to the major OR, placed on the table and administered general anesthesia. He was then moved to the modified lithotomy position with his perineum perpendicular to the floor. His perineum and genitalia were then sterilely prepped. An official timeout was then performed. A 16 French Foley catheter was then placed in the bladder and filled with dilute contrast, a rectal tube was placed in the rectum and the transrectal ultrasound probe was placed in the rectum and affixed to the stand. He was then sterilely draped.  Real time ultrasonography was used along with the seed planning software [employer] Prostate vs. 4.2.21. This was used to develop the seed plan including the number of needles as well as number of seeds required for complete and adequate coverage. Real-time ultrasonography was then used along with the previously developed plan and the Nucletron device to implant a total of 57 seeds using 19 needles. This proceeded without difficulty or complication. We then placed the 3 gold fiducial markers at the right base, right apex, and left lateral mid gland.  We then proceeded to mix the SpaceOAR using the kit supplied from the manufacturer. Once this was complete we placed a sinal needle into the perirectal fat between the rectum and the prostate. Once this was accomplished we injected 2cc of normal saline to  hydrodissect the plain. We then instilled the the SpaceOAR through the spinal needle and noted good distribution in the perirectal fat.    A Foley catheter was then removed as well as the transrectal ultrasound probe and rectal probe. Flexible cystoscopy was then performed using the 17 French flexible scope which revealed a normal urethra throughout its length down to the sphincter which appeared intact. The prostatic urethra revealed bilobar hypertrophy but no evidence of obstruction, spacers or lesions. We noted 2 seeds int he bladder which were removed with a grasper. The bladder was then entered and fully and systematically inspected. The ureteral orifices were noted to be of normal configuration and position. The mucosa revealed no evidence of tumors. There were also no stones identified within the bladder. I noted no seeds or spacers on the floor of the bladder and retroflexion of the scope revealed no seeds protruding from the base of the prostate.  The cystoscope was then removed and a new 36 French Foley catheter was then inserted and the balloon was filled with 10 cc of sterile water. This was connected to closed system drainage and the patient was awakened and taken to recovery room in stable and satisfactory condition. He tolerated procedure well and there were no intraoperative complications.

## 2017-10-10 NOTE — Progress Notes (Signed)
  Radiation Oncology         [phone] ________________________________  Name: Jon Brooks MRN: [id number]  Date: 10/10/2017  DOB: [birth year]       Prostate Seed Implant  LF:[id number], Percell Miller, MD  No ref. provider found  DIAGNOSIS: 76 yo man with adenocarcinoma of the prostate with a Gleason score of 4+5 and a PSA of 14.1    ICD-10-CM   1. Preop examination Z01.818 DG Chest 2 View    DG Chest 2 View  2. History of prostate cancer Z85.46 US Guided Needle Placement    [place] Intraoperative    [place] PROSTATE GOLD FEED MARKERS    US Guided Needle Placement    [place] Intraoperative    [place] PROSTATE GOLD FEED MARKERS  3. History of prostate cancer Z85.46 US Guided Needle Placement    [place] Intraoperative    [place] PROSTATE GOLD FEED MARKERS    US Guided Needle Placement    [place] Intraoperative    [place] PROSTATE GOLD FEED MARKERS  4. History of prostate cancer Z85.46 US Guided Needle Placement    [place] Intraoperative    [place] PROSTATE GOLD FEED MARKERS    US Guided Needle Placement    [place] Intraoperative    [place] PROSTATE GOLD FEED MARKERS    PROCEDURE: Insertion of radioactive I-125 seeds into the prostate gland.  RADIATION DOSE: 110 Gy, boost therapy.  TECHNIQUE: Jon Brooks was brought to the operating room with the urologist. He was placed in the dorsolithotomy position. He was catheterized and a rectal tube was inserted. The perineum was shaved, prepped and draped. The ultrasound probe was then introduced into the rectum to see the prostate gland.  TREATMENT DEVICE: A needle grid was attached to the ultrasound probe stand and anchor needles were placed.  3D PLANNING: The prostate was imaged in 3D using a sagittal sweep of the prostate probe. These images were transferred to the planning computer. There, the prostate, urethra and rectum were defined on each axial reconstructed image. Then, the software created an optimized 3D plan and a few seed positions were adjusted. The quality of the plan was reviewed using  [facility] information for the target and the following two organs at risk:  Urethra and Rectum.  Then the accepted plan was uploaded to the seed Selectron afterloading unit.  PROSTATE VOLUME STUDY:  Using transrectal ultrasound the volume of the prostate was verified to be 62.5 cc.  SPECIAL TREATMENT PROCEDURE/SUPERVISION AND HANDLING: The Nucletron FIRST system was used to place the needles under sagittal guidance. A total of 19 needles were used to deposit 59 seeds in the prostate gland. The individual seed activity was 0.559 mCi.    SpaceOAR:  Yes  COMPLEX SIMULATION: At the end of the procedure, an anterior radiograph of the pelvis was obtained to document seed positioning and count. Cystoscopy was performed to check the urethra and bladder.  2 seeds were loose in the bladder and removed cystoscopically.  These were given to physics for proper decay and disposal leaving 57 seeds in the prostate  MICRODOSIMETRY: At the end of the procedure, the patient was emitting 0.075 mR/hr at 1 meter. Accordingly, he was considered safe for hospital discharge.  PLAN: The patient will return to the radiation oncology clinic for post implant CT dosimetry in three weeks.  He will also receive external radiation.   ________________________________  Sheral Apley Tammi Klippel, M.D.

## 2017-10-10 NOTE — Anesthesia Preprocedure Evaluation (Signed)
Anesthesia Evaluation  Patient identified by MRN, date of birth, ID band Patient awake    Reviewed: Allergy & Precautions, H&P , NPO status , Patient's Chart, lab work & pertinent test results  Airway Mallampati: II   Neck ROM: full    Dental   Pulmonary neg pulmonary ROS,    breath sounds clear to auscultation       Cardiovascular hypertension, + Peripheral Vascular Disease   Rhythm:regular Rate:Normal     Neuro/Psych TIA   GI/Hepatic   Endo/Other  diabetes, Type 2  Renal/GU Renal InsufficiencyRenal diseaseS/p nephrectomy for RCC     Musculoskeletal   Abdominal   Peds  Hematology   Anesthesia Other Findings   Reproductive/Obstetrics                             Anesthesia Physical Anesthesia Plan  ASA: III  Anesthesia Plan: General   Post-op Pain Management:    Induction: Intravenous  PONV Risk Score and Plan: 2 and Ondansetron, Dexamethasone and Treatment may vary due to age or medical condition  Airway Management Planned: LMA  Additional Equipment:   Intra-op Plan:   Post-operative Plan:   Informed Consent: I have reviewed the patients History and Physical, chart, labs and discussed the procedure including the risks, benefits and alternatives for the proposed anesthesia with the patient or authorized representative who has indicated his/her understanding and acceptance.     Plan Discussed with: CRNA, Anesthesiologist and Surgeon  Anesthesia Plan Comments:         Anesthesia Quick Evaluation

## 2017-10-10 NOTE — H&P (Signed)
Urology Admission H&P  Chief Complaint: prostate cancer  History of Present Illness: Jon Brooks is a 76yo with high risk prostate cancer  Past Medical History:  Diagnosis Date  . Anemia   . Diverticulosis of colon   . Frequency of urination   . Glaucoma, both eyes   . Heart murmur   . History of adenomatous polyp of colon 05/16/2013  . History of transient ischemic attack (TIA) 06/19/2015   no residual  . Hot flashes   . Hyperlipidemia   . Nocturia   . Prostate cancer [facility]) urologist-  dr Alyson Ingles  oncologist -- dr Tammi Klippel   dx [id number] via bx--- Gleason 4+5,  PSA 14.1, vol 52cc/  08-16-2017 last PSA  1.3 (per dr Alyson Ingles office note)    . Renal cell carcinoma, left [facility]) oncologist-  dr Barbaraann Share zhou ([facility])/  urologist-  dr Alyson Ingles   Stage III (pT3a, pN0)  Chromophobe RCC into perinephric fat /  01-20-2017 s/p  open radical left nephrecotmy/- per [facility] oncologist note -- no evidence of disease  . Sarcoidosis    per pt year 1980--- last Chest CT 05-15-2017  . Type 2 diabetes mellitus ([place])   . Wears partial dentures    upper and lower   Past Surgical History:  Procedure Laterality Date  . COLONOSCOPY N/A 05/16/2013   Procedure: COLONOSCOPY;  Surgeon: Daneil Dolin, MD;  Location: [facility];  Service: Endoscopy;  Laterality: N/A;  9:30  . cyst on left shoulder  yrs ago  . MOLE REMOVAL     lower back by dr Romona Curls  . NEPHRECTOMY Left 01/20/2017   Procedure: OPEN NEPHRECTOMY;  Surgeon: Cleon Gustin, MD;  Location: [facility] ORS;  Service: Urology;  Laterality: Left;  . TRANSTHORACIC ECHOCARDIOGRAM  06/20/2015   mild to moderate LVH, ef 60-65%/  trivial Jon    Home Medications:  Current Facility-Administered Medications  Medication Dose Route Frequency Provider Last Rate Last Dose  . ceFAZolin (ANCEF) IVPB 2g/100 mL premix  2 g Intravenous Once Cleon Gustin, MD      . ciprofloxacin (CIPRO) IVPB 400 mg  400 mg Intravenous 60 min Pre-Op Cleon Gustin, MD      . lactated ringers infusion   Intravenous Continuous Albertha Ghee, MD 50 mL/hr at 10/10/17 1020    . [START ON 10/11/2017] sodium phosphate (FLEET) 7-19 GM/118ML enema 1 enema  1 enema Rectal Once Cleon Gustin, MD      . Derrill Memo ON 10/11/2017] sodium phosphate (FLEET) 7-19 GM/118ML enema 1 enema  1 enema Rectal Once McKenzie, Candee Furbish, MD       Facility-Administered Medications Ordered in Other Encounters  Medication Dose Route Frequency Provider Last Rate Last Dose  . magnesium citrate solution 1 Bottle  1 Bottle Oral Once McKenzie, Candee Furbish, MD       Allergies: No Known Allergies  Family History  Problem Relation Age of Onset  . Liver disease Father        NASH? age 67  . Diabetes Father   . Colon cancer Neg Hx    Social History:  reports that he has never smoked. He has never used smokeless tobacco. He reports that he does not drink alcohol or use drugs.  Review of Systems  All other systems reviewed and are negative.   Physical Exam:  Vital signs in last 24 hours: Temp:  [98.2 F (36.8 C)] 98.2 F (36.8 C) (10/16 0917) Pulse Rate:  [70] 70 (10/16  3094) Resp:  [19] 19 (10/16 0917) BP: (162)/(75) 162/75 (10/16 0917) SpO2:  [100 %] 100 % (10/16 0917) Weight:  [88.7 kg (195 lb 8 oz)] 88.7 kg (195 lb 8 oz) (10/16 0768) Physical Exam  Constitutional: He is oriented to person, place, and time. He appears well-developed and well-nourished.  HENT:  Head: Normocephalic and atraumatic.  Eyes: Pupils are equal, round, and reactive to light. EOM are normal.  Neck: Normal range of motion. No thyromegaly present.  Cardiovascular: Normal rate and regular rhythm.   Respiratory: Effort normal. No respiratory distress.  GI: Soft. He exhibits no distension.  Musculoskeletal: Normal range of motion. He exhibits no edema.  Neurological: He is alert and oriented to person, place, and time.  Skin: Skin is warm and dry.  Psychiatric: He has a normal mood and affect. His  behavior is normal. Judgment and thought content normal.    Laboratory Data:  Results for orders placed or performed during the hospital encounter of 10/10/17 (from the past 24 hour(s))  Glucose, capillary     Status: None   Collection Time: 10/10/17 10:34 AM  Result Value Ref Range   Glucose-Capillary 91 65 - 99 mg/dL   No results found for this or any previous visit (from the past 240 hour(s)). Creatinine: No results for input(s): CREATININE in the last 168 hours. Baseline Creatinine: unknwon  Impression/Assessment:  76yo with high risk prostate cancer  Plan:  The risks/benefits/alternatives to brachytherapy and SpaceOAR was explained to the patient and he understands and wishes to proceed withs Jon Brooks 10/10/2017, 11:42 AM

## 2017-10-10 NOTE — Anesthesia Procedure Notes (Signed)
Procedure Name: LMA Insertion Date/Time: 10/10/2017 11:53 AM Performed by: Toula Moos L Pre-anesthesia Checklist: Patient identified, Emergency Drugs available, Suction available, Patient being monitored and Timeout performed Patient Re-evaluated:Patient Re-evaluated prior to induction Oxygen Delivery Method: Circle system utilized Preoxygenation: Pre-oxygenation with 100% oxygen Induction Type: IV induction Ventilation: Mask ventilation without difficulty LMA: LMA inserted LMA Size: 5.0 Number of attempts: 1 Airway Equipment and Method: Bite block Placement Confirmation: positive ETCO2 Tube secured with: Tape Dental Injury: Teeth and Oropharynx as per pre-operative assessment

## 2017-10-10 NOTE — Transfer of Care (Signed)
Immediate Anesthesia Transfer of Care Note  Patient: Jon Brooks  Procedure(s) Performed: GOLD SEED IMPLANT (N/A Prostate) RADIOACTIVE SEED IMPLANT/BRACHYTHERAPY IMPLANT (N/A Prostate) SPACE OAR INSTILLATION (N/A Rectum) CYSTOSCOPY FLEXIBLE (N/A Bladder)  Patient Location: PACU  Anesthesia Type:General  Level of Consciousness: sedated  Airway & Oxygen Therapy: Patient Spontanous Breathing and Patient connected to face mask oxygen  Post-op Assessment: Report given to RN and Post -op Vital signs reviewed and stable  Post vital signs: Reviewed and stable  Last Vitals:  Vitals:   10/10/17 0917 10/10/17 1338  BP: (!) 162/75 126/67  Pulse: 70 74  Resp: 19 18  Temp: 36.8 C 37.4 C  SpO2: 100% 100%    Last Pain:  Vitals:   10/10/17 0917  TempSrc: Oral      Patients Stated Pain Goal: 10 (65/78/46 9629)  Complications: No apparent anesthesia complications

## 2017-10-11 ENCOUNTER — Telehealth: Payer: Self-pay | Admitting: *Deleted

## 2017-10-11 ENCOUNTER — Encounter (HOSPITAL_BASED_OUTPATIENT_CLINIC_OR_DEPARTMENT_OTHER): Payer: Self-pay | Admitting: Urology

## 2017-10-11 NOTE — Telephone Encounter (Signed)
CALLED PATIENT TO INFORM OF SIM APPT. ON 10-27-17 @ 2 PM @ DR. MANNING'S OFFICE AND HIS MRI ON 10-27-17 @ 4 PM, PT. TO ARRIVE @ 3:45 PM @ [facility], NO RESTRICTIONS TO TEST, NO ANSWER WILL CALL LATER

## 2017-10-11 NOTE — Anesthesia Postprocedure Evaluation (Signed)
Anesthesia Post Note  Patient: Jon Brooks  Procedure(s) Performed: GOLD SEED IMPLANT (N/A Prostate) RADIOACTIVE SEED IMPLANT/BRACHYTHERAPY IMPLANT (N/A Prostate) SPACE OAR INSTILLATION (N/A Rectum) CYSTOSCOPY FLEXIBLE (N/A Bladder)     Patient location during evaluation: PACU Anesthesia Type: General Level of consciousness: awake and alert Pain management: pain level controlled Vital Signs Assessment: post-procedure vital signs reviewed and stable Respiratory status: spontaneous breathing, nonlabored ventilation, respiratory function stable and patient connected to nasal cannula oxygen Cardiovascular status: blood pressure returned to baseline and stable Postop Assessment: no apparent nausea or vomiting Anesthetic complications: no    Last Vitals:  Vitals:   10/10/17 1415 10/10/17 1532  BP: 127/74 138/75  Pulse: 66 61  Resp: 13 15  Temp:  37.1 C  SpO2: 95% 97%    Last Pain:  Vitals:   10/10/17 1532  TempSrc: Oral                 Tavita Eastham S

## 2017-10-18 ENCOUNTER — Ambulatory Visit (INDEPENDENT_AMBULATORY_CARE_PROVIDER_SITE_OTHER): Payer: Self-pay | Admitting: Urology

## 2017-10-18 DIAGNOSIS — C61 Malignant neoplasm of prostate: Secondary | ICD-10-CM

## 2017-10-26 ENCOUNTER — Telehealth: Payer: Self-pay | Admitting: *Deleted

## 2017-10-26 NOTE — Telephone Encounter (Signed)
Called patient to remind of sim for 10-27-17 @ 2 pm and his MRI @ 4 pm @ [facility], spoke with patient and he is aware of these appts.

## 2017-10-27 ENCOUNTER — Ambulatory Visit
Admission: RE | Admit: 2017-10-27 | Discharge: 2017-10-27 | Disposition: A | Discharge status: Home / Self Care | Payer: Medicare Other | Source: Ambulatory Visit | Attending: Radiation Oncology | Admitting: Radiation Oncology

## 2017-10-27 ENCOUNTER — Ambulatory Visit (HOSPITAL_COMMUNITY)
Admission: RE | Admit: 2017-10-27 | Discharge: 2017-10-27 | Disposition: A | Discharge status: Home / Self Care | Payer: Medicare Other | Source: Ambulatory Visit | Attending: Urology | Admitting: Urology

## 2017-10-27 ENCOUNTER — Ambulatory Visit
Admission: RE | Admit: 2017-10-27 | Discharge: 2017-10-27 | Disposition: A | Discharge status: Home / Self Care | Payer: Medicare Other | Source: Ambulatory Visit

## 2017-10-27 ENCOUNTER — Ambulatory Visit: Payer: Medicare Other | Admitting: Radiation Oncology

## 2017-10-27 DIAGNOSIS — C61 Malignant neoplasm of prostate: Secondary | ICD-10-CM | POA: Diagnosis present

## 2017-10-27 DIAGNOSIS — Z510 Encounter for antineoplastic radiation therapy: Secondary | ICD-10-CM | POA: Diagnosis not present

## 2017-10-27 NOTE — Progress Notes (Signed)
  Radiation Oncology         [phone]) [phone] ________________________________  Name: Jon Brooks. MRN: [id number]  Date: 10/27/2017  DOB: [birth year]  SIMULATION AND TREATMENT PLANNING NOTE    ICD-10-CM   1. Prostate cancer [facility]) C61     DIAGNOSIS:  76 yo man with adenocarcinoma of the prostate with a Gleason score of 4+5 and a PSA of 14.1  NARRATIVE:  The patient was brought to the [place].  Identity was confirmed.  All relevant records and images related to the planned course of therapy were reviewed.  The patient freely provided informed written consent to proceed with treatment after reviewing the details related to the planned course of therapy. The consent form was witnessed and verified by the simulation staff.  Then, the patient was set-up in a stable reproducible supine position for radiation therapy.  A vacuum lock pillow device was custom fabricated to position his legs in a reproducible immobilized position.  Then, I performed a urethrogram under sterile conditions to identify the prostatic apex.  CT images were obtained.  Surface markings were placed.  The CT images were loaded into the planning software.  Then the prostate target and avoidance structures including the rectum, bladder, bowel and hips were contoured.  Treatment planning then occurred.  The radiation prescription was entered and confirmed.  A total of one complex treatment devices was fabricated. I have requested : Intensity Modulated Radiotherapy (IMRT) is medically necessary for this case for the following reason:  Rectal sparing.  PLAN:  The patient will receive 45 Gy in 25 fractions to supplement prostate seed boost up-front.  ________________________________  Sheral Apley. Tammi Klippel, M.D.   This document serves as a record of services personally performed by Tyler Pita, MD. It was created on his behalf by Reola Mosher, a trained medical scribe. The creation of this record is based on  the scribe's personal observations and the provider's statements to them. This document has been checked and approved by the attending provider.

## 2017-10-29 ENCOUNTER — Ambulatory Visit: Payer: Medicare Other | Admitting: Radiation Oncology

## 2017-10-30 ENCOUNTER — Ambulatory Visit: Payer: Medicare Other | Admitting: Radiation Oncology

## 2017-11-03 ENCOUNTER — Encounter: Payer: Self-pay | Admitting: Radiation Oncology

## 2017-11-03 DIAGNOSIS — Z510 Encounter for antineoplastic radiation therapy: Secondary | ICD-10-CM | POA: Diagnosis not present

## 2017-11-07 ENCOUNTER — Ambulatory Visit
Admission: RE | Admit: 2017-11-07 | Discharge: 2017-11-07 | Disposition: A | Discharge status: Home / Self Care | Payer: Medicare Other | Source: Ambulatory Visit | Attending: Radiation Oncology | Admitting: Radiation Oncology

## 2017-11-07 DIAGNOSIS — Z510 Encounter for antineoplastic radiation therapy: Secondary | ICD-10-CM | POA: Diagnosis not present

## 2017-11-08 ENCOUNTER — Ambulatory Visit
Admission: RE | Admit: 2017-11-08 | Discharge: 2017-11-08 | Disposition: A | Discharge status: Home / Self Care | Payer: Medicare Other | Source: Ambulatory Visit | Attending: Radiation Oncology | Admitting: Radiation Oncology

## 2017-11-08 DIAGNOSIS — Z510 Encounter for antineoplastic radiation therapy: Secondary | ICD-10-CM | POA: Diagnosis not present

## 2017-11-09 ENCOUNTER — Ambulatory Visit
Admission: RE | Admit: 2017-11-09 | Discharge: 2017-11-09 | Disposition: A | Discharge status: Home / Self Care | Payer: Medicare Other | Source: Ambulatory Visit | Attending: Radiation Oncology | Admitting: Radiation Oncology

## 2017-11-09 DIAGNOSIS — Z510 Encounter for antineoplastic radiation therapy: Secondary | ICD-10-CM | POA: Diagnosis not present

## 2017-11-10 ENCOUNTER — Ambulatory Visit
Admission: RE | Admit: 2017-11-10 | Discharge: 2017-11-10 | Disposition: A | Discharge status: Home / Self Care | Payer: Medicare Other | Source: Ambulatory Visit | Attending: Radiation Oncology | Admitting: Radiation Oncology

## 2017-11-10 DIAGNOSIS — Z510 Encounter for antineoplastic radiation therapy: Secondary | ICD-10-CM | POA: Diagnosis not present

## 2017-11-12 ENCOUNTER — Ambulatory Visit
Admission: RE | Admit: 2017-11-12 | Discharge: 2017-11-12 | Disposition: A | Discharge status: Home / Self Care | Payer: Medicare Other | Source: Ambulatory Visit | Attending: Radiation Oncology | Admitting: Radiation Oncology

## 2017-11-12 DIAGNOSIS — Z510 Encounter for antineoplastic radiation therapy: Secondary | ICD-10-CM | POA: Diagnosis not present

## 2017-11-12 NOTE — Progress Notes (Addendum)
  Radiation Oncology         [phone]) [phone] ________________________________  Name: Jon Brooks. MRN: [id number]  Date: 11/03/2017  DOB: [birth year]  3D Planning Note   Prostate Brachytherapy Post-Implant Dosimetry  Diagnosis: 76 yo man with adenocarcinoma of the prostate with a Gleason score of 4+5 and a PSA of 14.1  Narrative: On a previous date, Jon Brooks. returned following prostate seed implantation for post implant planning. He underwent CT scan complex simulation to delineate the three-dimensional structures of the pelvis and demonstrate the radiation distribution.  Since that time, the seed localization, and complex isodose planning with dose volume histograms have now been completed.  Results:   Prostate Coverage - The dose of radiation delivered to the 90% or more of the prostate gland (D90) was 111.25% of the prescription dose. This exceeds our goal of greater than 90%. Rectal Sparing - The volume of rectal tissue receiving the prescription dose or higher was 0.0 cc. This falls under our thresholds tolerance of 1.0 cc.  Impression: The prostate seed implant appears to show adequate target coverage and appropriate rectal sparing.  Plan:  The patient will continue to follow with urology for ongoing PSA determinations. I would anticipate a high likelihood for local tumor control with minimal risk for rectal morbidity.  ________________________________  Sheral Apley Tammi Klippel, M.D.

## 2017-11-13 ENCOUNTER — Ambulatory Visit
Admission: RE | Admit: 2017-11-13 | Discharge: 2017-11-13 | Disposition: A | Discharge status: Home / Self Care | Payer: Medicare Other | Source: Ambulatory Visit | Attending: Radiation Oncology | Admitting: Radiation Oncology

## 2017-11-13 DIAGNOSIS — Z510 Encounter for antineoplastic radiation therapy: Secondary | ICD-10-CM | POA: Diagnosis not present

## 2017-11-14 ENCOUNTER — Ambulatory Visit
Admission: RE | Admit: 2017-11-14 | Discharge: 2017-11-14 | Disposition: A | Discharge status: Home / Self Care | Payer: Medicare Other | Source: Ambulatory Visit | Attending: Radiation Oncology | Admitting: Radiation Oncology

## 2017-11-14 ENCOUNTER — Encounter (HOSPITAL_COMMUNITY): Payer: Medicare Other | Attending: Oncology

## 2017-11-14 DIAGNOSIS — C649 Malignant neoplasm of unspecified kidney, except renal pelvis: Secondary | ICD-10-CM | POA: Insufficient documentation

## 2017-11-14 DIAGNOSIS — C61 Malignant neoplasm of prostate: Secondary | ICD-10-CM | POA: Diagnosis present

## 2017-11-14 DIAGNOSIS — Z510 Encounter for antineoplastic radiation therapy: Secondary | ICD-10-CM | POA: Diagnosis not present

## 2017-11-14 LAB — CBC WITH DIFFERENTIAL/PLATELET
BASOS ABS: 0 10*3/uL (ref 0.0–0.1)
Basophils Relative: 0 %
EOS ABS: 0.2 10*3/uL (ref 0.0–0.7)
EOS PCT: 4 %
HCT: 35.7 % — ABNORMAL LOW (ref 39.0–52.0)
HEMOGLOBIN: 10.6 g/dL — AB (ref 13.0–17.0)
Lymphocytes Relative: 25 %
Lymphs Abs: 1 10*3/uL (ref 0.7–4.0)
MCH: 26 pg (ref 26.0–34.0)
MCHC: 29.7 g/dL — ABNORMAL LOW (ref 30.0–36.0)
MCV: 87.7 fL (ref 78.0–100.0)
Monocytes Absolute: 0.4 10*3/uL (ref 0.1–1.0)
Monocytes Relative: 10 %
NEUTROS PCT: 61 %
Neutro Abs: 2.6 10*3/uL (ref 1.7–7.7)
PLATELETS: 214 10*3/uL (ref 150–400)
RBC: 4.07 MIL/uL — AB (ref 4.22–5.81)
RDW: 16.8 % — ABNORMAL HIGH (ref 11.5–15.5)
WBC: 4.2 10*3/uL (ref 4.0–10.5)

## 2017-11-14 LAB — COMPREHENSIVE METABOLIC PANEL
ALBUMIN: 3.8 g/dL (ref 3.5–5.0)
ALT: 21 U/L (ref 17–63)
AST: 29 U/L (ref 15–41)
Alkaline Phosphatase: 79 U/L (ref 38–126)
Anion gap: 8 (ref 5–15)
BUN: 32 mg/dL — AB (ref 6–20)
CHLORIDE: 110 mmol/L (ref 101–111)
CO2: 22 mmol/L (ref 22–32)
Calcium: 9 mg/dL (ref 8.9–10.3)
Creatinine, Ser: 1.51 mg/dL — ABNORMAL HIGH (ref 0.61–1.24)
GFR calc Af Amer: 51 mL/min — ABNORMAL LOW (ref >60)
GFR calc non Af Amer: 44 mL/min — ABNORMAL LOW (ref >60)
GLUCOSE: 126 mg/dL — AB (ref 65–99)
Potassium: 4.7 mmol/L (ref 3.5–5.1)
SODIUM: 140 mmol/L (ref 135–145)
Total Bilirubin: 0.6 mg/dL (ref 0.3–1.2)
Total Protein: 8.3 g/dL — ABNORMAL HIGH (ref 6.5–8.1)

## 2017-11-15 ENCOUNTER — Ambulatory Visit
Admission: RE | Admit: 2017-11-15 | Discharge: 2017-11-15 | Disposition: A | Discharge status: Home / Self Care | Payer: Medicare Other | Source: Ambulatory Visit | Attending: Radiation Oncology | Admitting: Radiation Oncology

## 2017-11-15 ENCOUNTER — Ambulatory Visit: Payer: Medicare Other

## 2017-11-15 DIAGNOSIS — Z510 Encounter for antineoplastic radiation therapy: Secondary | ICD-10-CM | POA: Diagnosis present

## 2017-11-15 DIAGNOSIS — C61 Malignant neoplasm of prostate: Secondary | ICD-10-CM | POA: Insufficient documentation

## 2017-11-17 ENCOUNTER — Ambulatory Visit (HOSPITAL_COMMUNITY): Admission: RE | Admit: 2017-11-17 | Payer: Medicare Other | Source: Ambulatory Visit

## 2017-11-20 ENCOUNTER — Ambulatory Visit
Admission: RE | Admit: 2017-11-20 | Discharge: 2017-11-20 | Disposition: A | Discharge status: Home / Self Care | Payer: Medicare Other | Source: Ambulatory Visit | Attending: Radiation Oncology | Admitting: Radiation Oncology

## 2017-11-20 DIAGNOSIS — Z510 Encounter for antineoplastic radiation therapy: Secondary | ICD-10-CM | POA: Diagnosis not present

## 2017-11-21 ENCOUNTER — Ambulatory Visit
Admission: RE | Admit: 2017-11-21 | Discharge: 2017-11-21 | Disposition: A | Discharge status: Home / Self Care | Payer: Medicare Other | Source: Ambulatory Visit | Attending: Radiation Oncology | Admitting: Radiation Oncology

## 2017-11-21 DIAGNOSIS — Z510 Encounter for antineoplastic radiation therapy: Secondary | ICD-10-CM | POA: Diagnosis not present

## 2017-11-22 ENCOUNTER — Ambulatory Visit: Payer: Medicare Other

## 2017-11-22 ENCOUNTER — Ambulatory Visit (INDEPENDENT_AMBULATORY_CARE_PROVIDER_SITE_OTHER): Payer: Medicare Other | Admitting: Urology

## 2017-11-22 ENCOUNTER — Ambulatory Visit
Admission: RE | Admit: 2017-11-22 | Discharge: 2017-11-22 | Disposition: A | Discharge status: Home / Self Care | Payer: Medicare Other | Source: Ambulatory Visit | Attending: Radiation Oncology | Admitting: Radiation Oncology

## 2017-11-22 DIAGNOSIS — Z510 Encounter for antineoplastic radiation therapy: Secondary | ICD-10-CM | POA: Diagnosis not present

## 2017-11-22 DIAGNOSIS — C61 Malignant neoplasm of prostate: Secondary | ICD-10-CM | POA: Diagnosis not present

## 2017-11-22 DIAGNOSIS — C642 Malignant neoplasm of left kidney, except renal pelvis: Secondary | ICD-10-CM | POA: Diagnosis not present

## 2017-11-23 ENCOUNTER — Ambulatory Visit
Admission: RE | Admit: 2017-11-23 | Discharge: 2017-11-23 | Disposition: A | Discharge status: Home / Self Care | Payer: Medicare Other | Source: Ambulatory Visit | Attending: Radiation Oncology | Admitting: Radiation Oncology

## 2017-11-23 DIAGNOSIS — Z510 Encounter for antineoplastic radiation therapy: Secondary | ICD-10-CM | POA: Diagnosis not present

## 2017-11-24 ENCOUNTER — Ambulatory Visit
Admission: RE | Admit: 2017-11-24 | Discharge: 2017-11-24 | Disposition: A | Discharge status: Home / Self Care | Payer: Medicare Other | Source: Ambulatory Visit | Attending: Radiation Oncology | Admitting: Radiation Oncology

## 2017-11-24 ENCOUNTER — Encounter (HOSPITAL_COMMUNITY): Payer: Medicare Other

## 2017-11-24 DIAGNOSIS — Z510 Encounter for antineoplastic radiation therapy: Secondary | ICD-10-CM | POA: Diagnosis not present

## 2017-11-27 ENCOUNTER — Ambulatory Visit
Admission: RE | Admit: 2017-11-27 | Discharge: 2017-11-27 | Disposition: A | Discharge status: Home / Self Care | Payer: Medicare Other | Source: Ambulatory Visit | Attending: Radiation Oncology | Admitting: Radiation Oncology

## 2017-11-27 DIAGNOSIS — Z510 Encounter for antineoplastic radiation therapy: Secondary | ICD-10-CM | POA: Diagnosis not present

## 2017-11-28 ENCOUNTER — Ambulatory Visit
Admission: RE | Admit: 2017-11-28 | Discharge: 2017-11-28 | Disposition: A | Discharge status: Home / Self Care | Payer: Medicare Other | Source: Ambulatory Visit | Attending: Radiation Oncology | Admitting: Radiation Oncology

## 2017-11-28 DIAGNOSIS — Z510 Encounter for antineoplastic radiation therapy: Secondary | ICD-10-CM | POA: Diagnosis not present

## 2017-11-29 ENCOUNTER — Ambulatory Visit: Payer: Medicare Other

## 2017-11-29 ENCOUNTER — Ambulatory Visit
Admission: RE | Admit: 2017-11-29 | Discharge: 2017-11-29 | Disposition: A | Discharge status: Home / Self Care | Payer: Medicare Other | Source: Ambulatory Visit | Attending: Radiation Oncology | Admitting: Radiation Oncology

## 2017-11-29 DIAGNOSIS — Z510 Encounter for antineoplastic radiation therapy: Secondary | ICD-10-CM | POA: Diagnosis not present

## 2017-11-30 ENCOUNTER — Ambulatory Visit
Admission: RE | Admit: 2017-11-30 | Discharge: 2017-11-30 | Disposition: A | Discharge status: Home / Self Care | Payer: Medicare Other | Source: Ambulatory Visit | Attending: Radiation Oncology | Admitting: Radiation Oncology

## 2017-11-30 DIAGNOSIS — Z510 Encounter for antineoplastic radiation therapy: Secondary | ICD-10-CM | POA: Diagnosis not present

## 2017-12-01 ENCOUNTER — Ambulatory Visit
Admission: RE | Admit: 2017-12-01 | Discharge: 2017-12-01 | Disposition: A | Discharge status: Home / Self Care | Payer: Medicare Other | Source: Ambulatory Visit | Attending: Radiation Oncology | Admitting: Radiation Oncology

## 2017-12-01 DIAGNOSIS — Z510 Encounter for antineoplastic radiation therapy: Secondary | ICD-10-CM | POA: Diagnosis not present

## 2017-12-04 ENCOUNTER — Ambulatory Visit: Payer: Medicare Other

## 2017-12-05 ENCOUNTER — Ambulatory Visit: Payer: Medicare Other

## 2017-12-06 ENCOUNTER — Ambulatory Visit: Payer: Medicare Other

## 2017-12-06 ENCOUNTER — Ambulatory Visit (HOSPITAL_COMMUNITY): Admission: RE | Admit: 2017-12-06 | Payer: Medicare Other | Source: Ambulatory Visit

## 2017-12-07 ENCOUNTER — Ambulatory Visit
Admission: RE | Admit: 2017-12-07 | Discharge: 2017-12-07 | Disposition: A | Discharge status: Home / Self Care | Payer: Medicare Other | Source: Ambulatory Visit | Attending: Radiation Oncology | Admitting: Radiation Oncology

## 2017-12-07 DIAGNOSIS — C61 Malignant neoplasm of prostate: Secondary | ICD-10-CM | POA: Diagnosis not present

## 2017-12-07 DIAGNOSIS — Z510 Encounter for antineoplastic radiation therapy: Secondary | ICD-10-CM | POA: Diagnosis present

## 2017-12-08 ENCOUNTER — Ambulatory Visit
Admission: RE | Admit: 2017-12-08 | Discharge: 2017-12-08 | Disposition: A | Discharge status: Home / Self Care | Payer: Medicare Other | Source: Ambulatory Visit | Attending: Radiation Oncology | Admitting: Radiation Oncology

## 2017-12-08 ENCOUNTER — Ambulatory Visit (HOSPITAL_COMMUNITY): Payer: Medicare Other

## 2017-12-08 DIAGNOSIS — Z510 Encounter for antineoplastic radiation therapy: Secondary | ICD-10-CM | POA: Diagnosis not present

## 2017-12-09 ENCOUNTER — Ambulatory Visit
Admission: RE | Admit: 2017-12-09 | Discharge: 2017-12-09 | Disposition: A | Discharge status: Home / Self Care | Payer: Medicare Other | Source: Ambulatory Visit | Attending: Radiation Oncology | Admitting: Radiation Oncology

## 2017-12-09 DIAGNOSIS — Z510 Encounter for antineoplastic radiation therapy: Secondary | ICD-10-CM | POA: Diagnosis not present

## 2017-12-11 ENCOUNTER — Ambulatory Visit
Admission: RE | Admit: 2017-12-11 | Discharge: 2017-12-11 | Disposition: A | Discharge status: Home / Self Care | Payer: Medicare Other | Source: Ambulatory Visit | Attending: Radiation Oncology | Admitting: Radiation Oncology

## 2017-12-11 DIAGNOSIS — Z510 Encounter for antineoplastic radiation therapy: Secondary | ICD-10-CM | POA: Diagnosis not present

## 2017-12-12 ENCOUNTER — Ambulatory Visit
Admission: RE | Admit: 2017-12-12 | Discharge: 2017-12-12 | Disposition: A | Discharge status: Home / Self Care | Payer: Medicare Other | Source: Ambulatory Visit | Attending: Radiation Oncology | Admitting: Radiation Oncology

## 2017-12-12 ENCOUNTER — Ambulatory Visit: Payer: Medicare Other

## 2017-12-12 DIAGNOSIS — Z510 Encounter for antineoplastic radiation therapy: Secondary | ICD-10-CM | POA: Diagnosis not present

## 2017-12-13 ENCOUNTER — Ambulatory Visit: Payer: Medicare Other

## 2017-12-13 ENCOUNTER — Ambulatory Visit
Admission: RE | Admit: 2017-12-13 | Discharge: 2017-12-13 | Disposition: A | Discharge status: Home / Self Care | Payer: Medicare Other | Source: Ambulatory Visit | Attending: Radiation Oncology | Admitting: Radiation Oncology

## 2017-12-13 DIAGNOSIS — Z510 Encounter for antineoplastic radiation therapy: Secondary | ICD-10-CM | POA: Diagnosis not present

## 2017-12-14 ENCOUNTER — Ambulatory Visit: Payer: Medicare Other

## 2017-12-14 ENCOUNTER — Encounter: Payer: Self-pay | Admitting: Radiation Oncology

## 2017-12-14 ENCOUNTER — Ambulatory Visit
Admission: RE | Admit: 2017-12-14 | Discharge: 2017-12-14 | Disposition: A | Discharge status: Home / Self Care | Payer: Medicare Other | Source: Ambulatory Visit | Attending: Radiation Oncology | Admitting: Radiation Oncology

## 2017-12-14 DIAGNOSIS — Z510 Encounter for antineoplastic radiation therapy: Secondary | ICD-10-CM | POA: Diagnosis not present

## 2017-12-15 ENCOUNTER — Ambulatory Visit: Payer: Medicare Other

## 2017-12-15 ENCOUNTER — Ambulatory Visit (HOSPITAL_COMMUNITY): Payer: Medicare Other

## 2017-12-15 NOTE — Progress Notes (Signed)
  Radiation Oncology         [phone]) [phone] ________________________________  Name: Jon Brooks. MRN: [id number]  Date: 12/14/2017  DOB: [birth year]  End of Treatment Note  Diagnosis:   76 y.o. 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     Indication for treatment:  Curative       Radiation treatment dates:   11/07/17 - 12/14/17  Site/dose:   Prostate seed boost // 110 Gy in 1 fx           Prostate initial pelvis // 45 Gy in 25 fx   Beams/energy:   09/06/17:  Brachytherapy // 3D (prostate seed boost) 10/11/17: Photon // 3D (prostate initial pelvis)  Narrative: The patient tolerated radiation treatment relatively well.  He reported nocturia 4 to 5 times per night but denied having any dysuria, hesitancy, straining, hematuria or fatigue and felt that he was emptying his bladder well on voiding. He reported having soft bowel movements 2-3 times a day.  He did experience increased urgency without incontinence and denied fatigue.   Plan: The patient has completed radiation treatment. The patient will return to radiation oncology clinic for routine followup in one month. I advised him to call or return sooner if he has any questions or concerns related to his recovery or treatment. ________________________________  Sheral Apley. Tammi Klippel, M.D.   This document serves as a record of services personally performed by Tyler Pita MD. It was created on his behalf by Delton Coombes, a trained medical scribe. The creation of this record is based on the scribe's personal observations and the provider's statements to them.

## 2017-12-22 ENCOUNTER — Ambulatory Visit (HOSPITAL_COMMUNITY): Payer: Medicare Other | Admitting: Oncology

## 2017-12-27 ENCOUNTER — Ambulatory Visit (HOSPITAL_COMMUNITY)
Admission: RE | Admit: 2017-12-27 | Discharge: 2017-12-27 | Disposition: A | Discharge status: Home / Self Care | Payer: Medicare Other | Source: Ambulatory Visit | Attending: Oncology | Admitting: Oncology

## 2017-12-27 DIAGNOSIS — C61 Malignant neoplasm of prostate: Secondary | ICD-10-CM | POA: Diagnosis present

## 2017-12-27 DIAGNOSIS — Z905 Acquired absence of kidney: Secondary | ICD-10-CM | POA: Insufficient documentation

## 2017-12-27 DIAGNOSIS — K8020 Calculus of gallbladder without cholecystitis without obstruction: Secondary | ICD-10-CM | POA: Diagnosis not present

## 2017-12-27 DIAGNOSIS — C649 Malignant neoplasm of unspecified kidney, except renal pelvis: Secondary | ICD-10-CM | POA: Diagnosis present

## 2017-12-27 LAB — POCT I-STAT CREATININE: Creatinine, Ser: 1.5 mg/dL — ABNORMAL HIGH (ref 0.61–1.24)

## 2017-12-27 MED ORDER — IOPAMIDOL (ISOVUE-300) INJECTION 61%
100.0000 mL | Freq: Once | INTRAVENOUS | Status: AC | PRN
  Administered 2017-12-27: 100 mL via INTRAVENOUS

## 2017-12-29 ENCOUNTER — Inpatient Hospital Stay (HOSPITAL_COMMUNITY): Payer: Medicare Other | Attending: Oncology | Admitting: Oncology

## 2017-12-29 ENCOUNTER — Encounter (HOSPITAL_COMMUNITY): Payer: Self-pay | Admitting: Oncology

## 2017-12-29 VITALS — BP 146/64 | HR 72 | Temp 98.8°F | Resp 18 | Ht 67.0 in | Wt 199.5 lb

## 2017-12-29 DIAGNOSIS — Z85528 Personal history of other malignant neoplasm of kidney: Secondary | ICD-10-CM

## 2017-12-29 DIAGNOSIS — C61 Malignant neoplasm of prostate: Secondary | ICD-10-CM

## 2017-12-29 DIAGNOSIS — N2889 Other specified disorders of kidney and ureter: Secondary | ICD-10-CM

## 2017-12-29 DIAGNOSIS — C649 Malignant neoplasm of unspecified kidney, except renal pelvis: Secondary | ICD-10-CM

## 2017-12-29 DIAGNOSIS — E119 Type 2 diabetes mellitus without complications: Secondary | ICD-10-CM

## 2017-12-29 NOTE — Progress Notes (Signed)
Progress Note  Jon Brooks. OB: 1941/03/01  MR#: [id number]  DGU#:[id number]  Patient Care Team: Sinda Du, MD as PCP - General (Internal Medicine) Gala Romney Cristopher Estimable, MD as Attending Physician (Gastroenterology)  CHIEF COMPLAINT:  Chief Complaint  Patient presents with  . Follow-up    VISIT DIAGNOSIS:  Stage III (pT3a, pNx) chromophobe renal cell carcinoma.  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 Tammi Klippel, radiation oncologist (November, 2017) 3.  Remote history of sarcoidosis 4.  History of diabetes    Chromophobe renal cell carcinoma ([place])   02/13/2017 Initial Diagnosis    Chromophobe renal cell carcinoma ([place])      05/15/2017 Imaging    IMPRESSION: 1. Status post left nephrectomy. No findings to suggest local recurrence of disease and no definite metastatic disease in the chest, abdomen or pelvis. 2. Densely calcified exophytic mass in the lower pole of the right kidney and 2 cm indeterminate lesion in the anterior aspect of the upper pole of the right kidney are both similar to the prior study. These are favored to be benign, but close attention on followup studies is recommended to ensure stability.       HISTORY OF PRESENT ILLNESS:  77 y.o. gentleman was found to have an enlarged prostate and a biopsy was done. Biopsy revealed high Gleason grade carcinoma of prostate. Patient was referred to Dr Tammi Klippel. Mr. Nitta notes that nothing was done regarding his prostate until "things were cleared up" regarding his kidney mass and lymph nodes.  During that evaluation a CT scan revealed left kidney mass with the adjoining nodal mass.  There are multiple other small lymph nodes. He underwent left open radical nephrectomy with Dr. Alyson Ingles at the end of January 2018. Surgical path demonstrated Stage III (pT3a, pNx) chromophobe renal cell carcinoma.  INTERVAL  HISTORY: Patient presents today with his wife for continued follow-up. He states he has been doing well.  Patient admits of occasional diarrhea that is relieved with Imodium.  Patient states this is less frequent since chemotherapy.  Patient recently had CT scan with contrast, patient states this "cleaned him out".  He denies any vertigo as he mentioned at last visit.  He continues to get his Lupron shots for his prostate cancer.  Patient was last seen by his urologist Dr. Noah Delaine where brachyherapy and spaceOAR was performed.  Per note patient tolerated well.  Prostate brachy therapy post  implant dosimetry showed greater than 90% radiation was delivered to the prostate gland.  Expectation was 90%.  Patient offers no urinary symptoms or hematuria at this time  Review of Systems  Constitutional: Negative.   HENT: Negative.   Eyes: Negative.   Respiratory: Negative.  Negative for shortness of breath.   Cardiovascular: Negative.  Negative for chest pain.  Gastrointestinal: Positive for diarrhea. Negative for abdominal pain.  Genitourinary: Negative.   Musculoskeletal: Negative.   Skin: Negative.   Neurological: Negative for dizziness.  Endo/Heme/Allergies: Negative.   Psychiatric/Behavioral: Negative.   All other systems reviewed and are negative. 14 point review of systems was performed and is negative except as detailed under history of present illness and above  PAST MEDICAL HISTORY: Past Medical History:  Diagnosis Date  . Anemia   . Diverticulosis of colon   . Frequency of urination   . Glaucoma, both eyes   . Heart murmur   . History of adenomatous polyp of colon 05/16/2013  .  History of transient ischemic attack (TIA) 06/19/2015   no residual  . Hot flashes   . Hyperlipidemia   . Nocturia   . Prostate cancer [facility]) urologist-  dr Alyson Ingles  oncologist -- dr Tammi Klippel   dx [id number] via bx--- Gleason 4+5,  PSA 14.1, vol 52cc/  08-16-2017 last PSA  1.3 (per dr Alyson Ingles office  note)    . Renal cell carcinoma, left [facility]) oncologist-  dr Barbaraann Share zhou ([facility])/  urologist-  dr Alyson Ingles   Stage III (pT3a, pN0)  Chromophobe RCC into perinephric fat /  01-20-2017 s/p  open radical left nephrecotmy/- per [facility] oncologist note -- no evidence of disease  . Sarcoidosis    per pt year 1980--- last Chest CT 05-15-2017  . Type 2 diabetes mellitus ([place])   . Wears partial dentures    upper and lower    PAST SURGICAL HISTORY: Past Surgical History:  Procedure Laterality Date  . COLONOSCOPY N/A 05/16/2013   Procedure: COLONOSCOPY;  Surgeon: Daneil Dolin, MD;  [facility];  Service: Endoscopy;  Laterality: N/A;  9:30  . cyst on left shoulder  yrs ago  . CYSTOSCOPY N/A 10/10/2017   Procedure: CYSTOSCOPY FLEXIBLE;  Surgeon: Cleon Gustin, MD;  Location: [facility];  Service: Urology;  Laterality: N/A;  2 seeds removed from bladder per Dr Alyson Ingles  . GOLD SEED IMPLANT N/A 10/10/2017   Procedure: GOLD SEED IMPLANT;  Surgeon: Cleon Gustin, MD;  Location: [facility];  Service: Urology;  Laterality: N/A;  3 gold seeds implanted  . MOLE REMOVAL     lower back by dr Romona Curls  . NEPHRECTOMY Left 01/20/2017   Procedure: OPEN NEPHRECTOMY;  Surgeon: Cleon Gustin, MD;  Location: [facility];  Service: Urology;  Laterality: Left;  . RADIOACTIVE SEED IMPLANT N/A 10/10/2017   Procedure: RADIOACTIVE SEED IMPLANT/BRACHYTHERAPY IMPLANT;  Surgeon: Cleon Gustin, MD;  Location: [facility];  Service: Urology;  Laterality: N/A;  57 seeds implanted  . SPACE OAR INSTILLATION N/A 10/10/2017   Procedure: SPACE OAR INSTILLATION;  Surgeon: Cleon Gustin, MD;  Location: [facility];  Service: Urology;  Laterality: N/A;  . TRANSTHORACIC ECHOCARDIOGRAM  06/20/2015   mild to moderate LVH, ef 60-65%/  trivial MR    FAMILY HISTORY Family History  Problem Relation Age of Onset  . Liver disease Father         NASH? age 64  . Diabetes Father   . Colon cancer Neg Hx      ADVANCED DIRECTIVES:   Patient does not have any living will or healthcare power of attorney.  Information was given .  Available resources had been discussed.  We will follow-up on subsequent appointments regarding this issue   HEALTH MAINTENANCE: Social History   Tobacco Use  . Smoking status: Never Smoker  . Smokeless tobacco: Never Used  Substance Use Topics  . Alcohol use: No  . Drug use: No    No Known Allergies  Current Outpatient Medications  Medication Sig Dispense Refill  . aspirin EC 81 MG tablet Take 81 mg by mouth every evening.    [name] atorvastatin (LIPITOR) 80 MG tablet Take 1 tablet (80 mg total) by mouth daily at 6 PM. (Patient taking differently: Take 80 mg by mouth every evening. ) 30 tablet 12  . AZOPT 1 % ophthalmic suspension Place 1 drop into both eyes 2 (two) times daily.     [name] HYDROcodone-acetaminophen ([place])  5-325 MG tablet Take 1 tablet by mouth every 6 (six) hours as needed for moderate pain. 30 tablet 0  . Leuprolide Acetate (LUPRON IJ) Inject as directed every 3 (three) months.    [name] lisinopril (PRINIVIL,ZESTRIL) 2.5 MG tablet Take 1 tablet (2.5 mg total) by mouth daily. (Patient taking differently: Take 2.5 mg by mouth every evening. ) 30 tablet 12  . metFORMIN (GLUCOPHAGE) 500 MG tablet Take 1 tablet (500 mg total) by mouth 2 (two) times daily with a meal. (Patient taking differently: Take 500 mg by mouth every evening. ) 60 tablet 5  . TRAVATAN Z 0.004 % SOLN ophthalmic solution Place 1 drop into both eyes at bedtime.      No current facility-administered medications for this visit.    Facility-Administered Medications Ordered in Other Visits  Medication Dose Route Frequency Provider Last Rate Last Dose  . magnesium citrate solution 1 Bottle  1 Bottle Oral Once Cleon Gustin, MD        OBJECTIVE:  Vitals:   12/29/17 1419  BP: (!) 146/64  Pulse: 72  Resp: 18  Temp:  98.8 F (37.1 C)  SpO2: 99%     Body mass index is 31.25 kg/m.     Physical Exam  Constitutional: He is oriented to person, place, and time and well-developed, well-nourished, and in no distress. No distress.  HENT:  Head: Normocephalic and atraumatic.  Mouth/Throat: Oropharynx is clear and moist. No oropharyngeal exudate.  Eyes: Conjunctivae and EOM are normal. Pupils are equal, round, and reactive to light. No scleral icterus.  Neck: Normal range of motion. Neck supple. No JVD present.  Cardiovascular: Normal rate, regular rhythm and normal heart sounds. Exam reveals no gallop and no friction rub.  No murmur heard. Pulmonary/Chest: Effort normal and breath sounds normal. No respiratory distress. He has no wheezes. He has no rales.  Abdominal: Soft. Bowel sounds are normal. He exhibits no distension. There is no tenderness. There is no guarding.  Musculoskeletal: Normal range of motion. He exhibits no edema or tenderness.  Lymphadenopathy:    He has no cervical adenopathy.  Neurological: He is alert and oriented to person, place, and time. No cranial nerve deficit. Gait normal.  Skin: Skin is warm and dry. No rash noted. No erythema. No pallor.  Psychiatric: Affect and judgment normal.  Nursing note and vitals reviewed.  ECOG FS:0 - Asymptomatic  LAB RESULTS:  Hospital Outpatient Visit on 12/27/2017  Component Date Value Ref Range Status  . Creatinine, Ser 12/27/2017 1.50* 0.61 - 1.24 mg/dL Final   PATHOLOGY:    RADIOGRAPHIC STUDIES: I have personally reviewed the radiological images as listed and agreed with the findings in the report.  Initial PET Scan 01/05/2017 IMPRESSION: 1. 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 The masslike extension of this process through the perirenal fascia and along the adjacent peritoneal margin probably represents local  extent of tumor. There is a left periaortic lymph node which also has low grade metabolic activity and is likely malignant. Low-grade activity in multiple lymph nodes including a right upper internal mammary lymph node; a lower periaortic lymph node in the chest; the pericardial lymph node; and a gastrohepatic ligament node. These nodes are all mildly enlarged. Although the activity of these nodes is low-grade, so is the activity at the original tumor. There also some scattered mesenteric lymph nodes eccentric to the  right which are not overtly hypermetabolic. Particularly the intra-abdominal nodal prominence could be due to reactive lymph nodes from cirrhosis. 2. Enlarged prostate gland with some faintly accentuated metabolic activity in the left lateral peripheral zone. This could reflect prostate cancer. 3. Coarse chronic interstitial lung disease. 4. Densely calcified exophytic mass of the right kidney has internal hypermetabolic activity. 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 The diffuse calcification pattern would be unusual but not impossible for renal cell carcinoma and there is a fatty component along the inferior margin. 5. Other imaging findings of potential clinical significance: Descending and sigmoid colon diverticulosis. Coronary, aortic arch, and branch vessel atherosclerotic vascular disease. Aortoiliac atherosclerotic vascular disease. Left femoral hernia. Cholelithiasis. Nodular contour of the liver, query cirrhosis.  CT Chest w/ Contrast 01/04/2017 IMPRESSION: 1. Partially imaged left upper quadrant nodal mass. Please refer to CT abdomen pelvis 12/09/2016. 2. Upper and midlung zone predominant interstitial coarsening and mild architectural distortion, nonspecific. Differential diagnosis includes nonspecific interstitial pneumonitis and chronic hypersensitivity pneumonitis. 3. Cirrhosis (VOH60-V37.10). Mild adenopathy in the lower chest  and upper abdomen, as before, likely related. 4. Aortic atherosclerosis (ICD10-170.0). Coronary artery calcification. 5. Cholelithiasis.  ASSESSMENT:  1.  Left chromophobe RCC T3 into perinephric fat s/p left open radical nephrectomy 01/20/17 2.  High Gleason grade carcinoma prostate.   3.  Remote history of sarcoidosis and diabetes 4. R renal mass 5. Vertigo  PLAN:   Clinically NED. Continue observation at this time. Continue follow-up with Dr. Tammi Klippel in [facility] as needed post brachy therapy.  Continue Lupron as prescribed by Dr. Alyson Ingles. Most recent CT scan showed no evidence of disease.  CT scan went over in detail with patient and wife.   All questions answered.  Per guidelines, patient should return for H&P assessment in 3-6 months with repeat CT scans.  We will get this scheduled and CTs ordered.  Return to clinic in 4 months for follow-up with CBC, CMP. Plan to repeat his CT chest/abdomen/pelvis prior to next visit.  NCCN guidelines for surveillance: H and P every 3-6 months for 3 years, then annually up to 5 years after radical nephrectomy and then clinically as indicated.  CMP and other tests as indicated every 6 months for 2 years then annually up to 5 years after radical nephrectomy done as quickly indicated.  Abdominal imaging: Baseline abdominal CT or MRI within 3-6 months then CT, MRI, every 3-6 months for at least 3 years and then annually up to 5 years.  Baseline CT chest within 3-6 months after radical nephrectomy with continued imaging (CT or chest x-ray) every 3-6 months for at least 3 years and then annually up to 5 years.  Patient expressed understanding and was in agreement with this plan. He also understands that He can call clinic at any time with any questions, concerns, or complaints.   Jacquelin Hawking, NP   12/29/2017 2:59 PM

## 2018-01-17 ENCOUNTER — Ambulatory Visit
Admission: RE | Admit: 2018-01-17 | Discharge: 2018-01-17 | Disposition: A | Discharge status: Home / Self Care | Payer: Medicare Other | Source: Ambulatory Visit | Attending: Urology | Admitting: Urology

## 2018-01-17 ENCOUNTER — Encounter: Payer: Self-pay | Admitting: Urology

## 2018-01-17 ENCOUNTER — Ambulatory Visit: Payer: Self-pay | Admitting: Urology

## 2018-01-17 ENCOUNTER — Other Ambulatory Visit: Payer: Self-pay

## 2018-01-17 VITALS — BP 139/74 | HR 77 | Temp 98.4°F | Resp 20 | Ht 67.0 in | Wt 197.0 lb

## 2018-01-17 DIAGNOSIS — C61 Malignant neoplasm of prostate: Secondary | ICD-10-CM | POA: Insufficient documentation

## 2018-01-17 DIAGNOSIS — Z7984 Long term (current) use of oral hypoglycemic drugs: Secondary | ICD-10-CM | POA: Diagnosis not present

## 2018-01-17 DIAGNOSIS — Z7982 Long term (current) use of aspirin: Secondary | ICD-10-CM | POA: Insufficient documentation

## 2018-01-17 DIAGNOSIS — Z923 Personal history of irradiation: Secondary | ICD-10-CM | POA: Diagnosis not present

## 2018-01-17 DIAGNOSIS — Z79899 Other long term (current) drug therapy: Secondary | ICD-10-CM | POA: Insufficient documentation

## 2018-01-17 NOTE — Progress Notes (Signed)
Radiation Oncology         [id number]) [phone] ________________________________  Name: [name]. MRN: [id number]  Date: 01/17/2018  DOB: 26-Mar-1941  Post Treatment Note  CC: Sinda Du, MD  Sinda Du, MD  Diagnosis:   77 y.o. 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  Interval Since Last Radiation:  5 weeks  11/07/17 - 12/14/17:  Prostate initial pelvis // 45 Gy in 25 fx  10/10/17:  Insertion of radioactive I-125 seeds into the prostate gland, 110 Gy, boost therapy                 Narrative:  The patient returns today for routine follow-up.  He tolerated radiation treatment relatively well.  He reported nocturia 4 to 5 times per night but denied having any dysuria, hesitancy, straining, hematuria or fatigue and felt that he was emptying his bladder well on voiding. He reported having soft bowel movements 2-3 times a day.  He did experience increased urgency without incontinence and denied fatigue.                            On review of systems, the patient states that he is doing very well overall.  His current IPSS is 6, indicating mild LUTS with increased frequency and nocturia x3/night.  He denies dysuria, gross hematuria, urgency, weak stream, incomplete emptying or incontinence.  He reports that his bowels are back to normal and denies abdominal pain, N/V or diarrhea.  He has a good appetite and is maintaining his weight.  He tolerates the ADT well (started 11/2016), aside from hot flashes.  His energy level is good and he remains quite active.  ALLERGIES:  has No Known Allergies.  Meds: Current Outpatient Medications  Medication Sig Dispense Refill  . aspirin EC 81 MG tablet Take 81 mg by mouth every evening.    [name] atorvastatin (LIPITOR) 80 MG tablet Take 1 tablet (80 mg total) by mouth daily at 6 PM. (Patient taking differently: Take 80 mg by mouth every evening. ) 30 tablet 12  . AZOPT 1 % ophthalmic suspension Place 1  drop into both eyes 2 (two) times daily.     [name] Leuprolide Acetate (LUPRON IJ) Inject as directed every 3 (three) months.    [name] lisinopril (PRINIVIL,ZESTRIL) 2.5 MG tablet Take 1 tablet (2.5 mg total) by mouth daily. (Patient taking differently: Take 2.5 mg by mouth every evening. ) 30 tablet 12  . metFORMIN (GLUCOPHAGE) 500 MG tablet Take 1 tablet (500 mg total) by mouth 2 (two) times daily with a meal. (Patient taking differently: Take 500 mg by mouth every evening. ) 60 tablet 5  . TRAVATAN Z 0.004 % SOLN ophthalmic solution Place 1 drop into both eyes at bedtime.     [name] HYDROcodone-acetaminophen (NORCO) 5-325 MG tablet Take 1 tablet by mouth every 6 (six) hours as needed for moderate pain. (Patient not taking: Reported on 01/17/2018) 30 tablet 0   No current facility-administered medications for this encounter.    Facility-Administered Medications Ordered in Other Encounters  Medication Dose Route Frequency Provider Last Rate Last Dose  . magnesium citrate solution 1 Bottle  1 Bottle Oral Once McKenzie, Candee Furbish, MD        Physical Findings:  height is 5\' 7"  (1.702 m) and weight is 197 lb (89.4 kg). His oral temperature is 98.4 F (36.9 C). His blood pressure  is 139/74 and his pulse is 77. His respiration is 20 and oxygen saturation is 100%.  Pain Assessment Pain Score: 0-No pain/10 In general this is a well appearing African American male  in no acute distress. He's alert and oriented x4 and appropriate throughout the examination. Cardiopulmonary assessment is negative for acute distress and he exhibits normal effort.   Lab Findings: Lab Results  Component Value Date   WBC 4.2 11/14/2017   HGB 10.6 (L) 11/14/2017   HCT 35.7 (L) 11/14/2017   MCV 87.7 11/14/2017   PLT 214 11/14/2017     Radiographic Findings: Ct Chest W Contrast  Result Date: 12/27/2017 CLINICAL DATA:  History of left renal cell carcinoma and prostate cancer. EXAM: CT CHEST, ABDOMEN, AND PELVIS WITH CONTRAST  TECHNIQUE: Multidetector CT imaging of the chest, abdomen and pelvis was performed following the standard protocol during bolus administration of intravenous contrast. CONTRAST:  165mL ISOVUE-300 IOPAMIDOL (ISOVUE-300) INJECTION 61% COMPARISON:  CT scan 05/15/2017 and PET-CT 01/05/2017 FINDINGS: CT CHEST FINDINGS Cardiovascular: The heart is normal in size. No pericardial effusion. The aorta is normal in caliber. No dissection. Stable atherosclerotic calcifications. Stable three-vessel coronary artery calcifications. Stable epicardial lymph nodes. The largest measures 14.5 mm on image number 38. Mediastinum/Nodes: No mediastinal or hilar mass or lymphadenopathy. Small scattered lymph nodes are stable. The esophagus is grossly normal. Lungs/Pleura: Stable emphysematous changes and pulmonary scarring along with stable changes of interstitial pulmonary fibrosis. No worrisome pulmonary lesions or evidence of pulmonary metastatic disease. No infiltrates or effusions. Musculoskeletal: No chest wall mass, supraclavicular or axillary adenopathy. The bony thorax is intact. Stable advanced degenerative changes involving the thoracic spine. CT ABDOMEN PELVIS FINDINGS Hepatobiliary: Stable cirrhotic changes involving the liver but no worrisome hepatic lesions or intrahepatic biliary dilatation. Stable cholelithiasis. Pancreas: No mass, inflammation or ductal dilatation. Spleen: Normal size.  No focal lesions. Adrenals/Urinary Tract: The right adrenal gland is normal. The left adrenal gland and left kidney are surgically absent. Lesion projecting off the midpole region of the right kidney anteriorly. 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 Stable 2 cm upper pole right renal lesion which is most likely a hyperdense/ hemorrhagic renal cysts. This is stable. Smaller renal cysts are also noted. Stomach/Bowel: The stomach, duodenum,  small bowel and colon are unremarkable. No acute inflammatory process, mass lesions or obstructive findings. There is significant descending and sigmoid colon diverticulosis but no findings for acute diverticulitis. Moderate to large amount of stool throughout the colon may suggest constipation. Vascular/Lymphatic: Upper abdominal lymph nodes are stable and consistent with cirrhosis. There are also small scattered retroperitoneal lymph nodes but no mass or overt adenopathy. Reproductive: Brachytherapy seeds are noted in an enlarged prostate gland. There is a rim enhancing fluid collection between prostate gland and the rectum possibly related to the recent Celsius placement procedure. It measures approximately 2.8 x 1.7 cm. If the patient is asymptomatic I assume this is a benign postoperative fluid collection but recommend correlation with clinical findings to exclude the possibility of the abscess. Mild perirectal interstitial changes could be related to radiation. Other: No pelvic lymphadenopathy. There are few small scattered stable pelvic lymph nodes. 7 mm external iliac lymph node on image number 89 is stable. 7 mm common femoral lymph node on image number 101 is stable. Bilateral inguinal hernias containing fat. There are small bilateral stable inguinal lymph nodes. Musculoskeletal: No significant bony findings. No lytic or sclerotic bone  lesions to suggest metastatic disease. IMPRESSION: 1. No CT findings for metastatic disease involving the chest or bony structures. 2. Status post left nephrectomy. No findings for local recurrent tumor or metastatic disease. 3. 2.8 x 1.7 cm rim enhancing fluid collection between the prostate gland and the rectum. This is likely a benign postop fluid collection but recommend clinical correlation to exclude the possibility of an abscess. 4. Brachytherapy seeds in the prostate gland. No pelvic mass or pelvic lymphadenopathy. Small scattered lymph nodes are stable. 5. Stable  cirrhotic changes involving the liver. No worrisome hepatic lesions. 6. Cholelithiasis 7. Stable complex right renal lesions, likely benign and stable as described above. Electronically Signed   By: Marijo Sanes M.D.   On: 12/27/2017 09:15   Ct Abdomen Pelvis W Contrast  Result Date: 12/27/2017 CLINICAL DATA:  History of left renal cell carcinoma and prostate cancer. EXAM: CT CHEST, ABDOMEN, AND PELVIS WITH CONTRAST TECHNIQUE: Multidetector CT imaging of the chest, abdomen and pelvis was performed following the standard protocol during bolus administration of intravenous contrast. CONTRAST:  137mL ISOVUE-300 IOPAMIDOL (ISOVUE-300) INJECTION 61% COMPARISON:  CT scan 05/15/2017 and PET-CT 01/05/2017 FINDINGS: CT CHEST FINDINGS Cardiovascular: The heart is normal in size. No pericardial effusion. The aorta is normal in caliber. No dissection. Stable atherosclerotic calcifications. Stable three-vessel coronary artery calcifications. Stable epicardial lymph nodes. The largest measures 14.5 mm on image number 38. Mediastinum/Nodes: No mediastinal or hilar mass or lymphadenopathy. Small scattered lymph nodes are stable. The esophagus is grossly normal. Lungs/Pleura: Stable emphysematous changes and pulmonary scarring along with stable changes of interstitial pulmonary fibrosis. No worrisome pulmonary lesions or evidence of pulmonary metastatic disease. No infiltrates or effusions. Musculoskeletal: No chest wall mass, supraclavicular or axillary adenopathy. The bony thorax is intact. Stable advanced degenerative changes involving the thoracic spine. CT ABDOMEN PELVIS FINDINGS Hepatobiliary: Stable cirrhotic changes involving the liver but no worrisome hepatic lesions or intrahepatic biliary dilatation. Stable cholelithiasis. Pancreas: No mass, inflammation or ductal dilatation. Spleen: Normal size.  No focal lesions. Adrenals/Urinary Tract: The right adrenal gland is normal. The left adrenal gland and left kidney are  surgically absent. Lesion projecting off the midpole region of the right kidney anteriorly. 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 Stable 2 cm upper pole right renal lesion which is most likely a hyperdense/ hemorrhagic renal cysts. This is stable. Smaller renal cysts are also noted. Stomach/Bowel: The stomach, duodenum, small bowel and colon are unremarkable. No acute inflammatory process, mass lesions or obstructive findings. There is significant descending and sigmoid colon diverticulosis but no findings for acute diverticulitis. Moderate to large amount of stool throughout the colon may suggest constipation. Vascular/Lymphatic: Upper abdominal lymph nodes are stable and consistent with cirrhosis. There are also small scattered retroperitoneal lymph nodes but no mass or overt adenopathy. Reproductive: Brachytherapy seeds are noted in an enlarged prostate gland. There is a rim enhancing fluid collection between prostate gland and the rectum possibly related to the recent Celsius placement procedure. It measures approximately 2.8 x 1.7 cm. If the patient is asymptomatic I assume this is a benign postoperative fluid collection but recommend correlation with clinical findings to exclude the possibility of the abscess. Mild perirectal interstitial changes could be related to radiation. Other: No pelvic lymphadenopathy. There are few small scattered stable pelvic lymph nodes. 7 mm external iliac lymph node on image number 89 is stable. 7 mm common femoral lymph node on image  number 101 is stable. Bilateral inguinal hernias containing fat. There are small bilateral stable inguinal lymph nodes. Musculoskeletal: No significant bony findings. No lytic or sclerotic bone lesions to suggest metastatic disease. IMPRESSION: 1. No CT findings for metastatic disease involving the chest or bony structures. 2. Status post left  nephrectomy. No findings for local recurrent tumor or metastatic disease. 3. 2.8 x 1.7 cm rim enhancing fluid collection between the prostate gland and the rectum. This is likely a benign postop fluid collection but recommend clinical correlation to exclude the possibility of an abscess. 4. Brachytherapy seeds in the prostate gland. No pelvic mass or pelvic lymphadenopathy. Small scattered lymph nodes are stable. 5. Stable cirrhotic changes involving the liver. No worrisome hepatic lesions. 6. Cholelithiasis 7. Stable complex right renal lesions, likely benign and stable as described above. Electronically Signed   By: Marijo Sanes M.D.   On: 12/27/2017 09:15    Impression/Plan: 1. 77 y.o. 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 He will continue to follow up with urology for ongoing PSA determinations and has an appointment scheduled with Dr. Alyson Ingles in March 2019. He anticipates completing a 2 year course of ADT.  He understands what to expect with regards to PSA monitoring going forward. I will look forward to following his response to treatment via correspondence with urology, and would be happy to continue to participate in his care if clinically indicated. I talked to the patient about what to expect in the future, including his risk for erectile dysfunction and rectal bleeding. I encouraged him to call or return to the office if he has any questions regarding his previous radiation or possible radiation side effects. He was comfortable with this plan and will follow up as needed.    Nicholos Johns, PA-C

## 2018-01-17 NOTE — Addendum Note (Signed)
Encounter addended by: Malena Edman, RN on: 01/17/2018 12:48 PM  Actions taken: Charge Capture section accepted

## 2018-02-28 ENCOUNTER — Ambulatory Visit (INDEPENDENT_AMBULATORY_CARE_PROVIDER_SITE_OTHER): Payer: Medicare Other | Admitting: Urology

## 2018-02-28 DIAGNOSIS — C642 Malignant neoplasm of left kidney, except renal pelvis: Secondary | ICD-10-CM | POA: Diagnosis not present

## 2018-02-28 DIAGNOSIS — C61 Malignant neoplasm of prostate: Secondary | ICD-10-CM | POA: Diagnosis not present

## 2018-04-25 ENCOUNTER — Other Ambulatory Visit (HOSPITAL_COMMUNITY): Payer: Self-pay

## 2018-04-25 DIAGNOSIS — N2889 Other specified disorders of kidney and ureter: Secondary | ICD-10-CM

## 2018-04-25 DIAGNOSIS — C61 Malignant neoplasm of prostate: Secondary | ICD-10-CM

## 2018-04-26 ENCOUNTER — Inpatient Hospital Stay (HOSPITAL_COMMUNITY): Payer: Medicare Other | Attending: Hematology

## 2018-04-26 DIAGNOSIS — K458 Other specified abdominal hernia without obstruction or gangrene: Secondary | ICD-10-CM | POA: Insufficient documentation

## 2018-04-26 DIAGNOSIS — C61 Malignant neoplasm of prostate: Secondary | ICD-10-CM | POA: Diagnosis not present

## 2018-04-26 DIAGNOSIS — Z7982 Long term (current) use of aspirin: Secondary | ICD-10-CM | POA: Diagnosis not present

## 2018-04-26 DIAGNOSIS — Z7984 Long term (current) use of oral hypoglycemic drugs: Secondary | ICD-10-CM | POA: Insufficient documentation

## 2018-04-26 DIAGNOSIS — H409 Unspecified glaucoma: Secondary | ICD-10-CM | POA: Insufficient documentation

## 2018-04-26 DIAGNOSIS — Z905 Acquired absence of kidney: Secondary | ICD-10-CM | POA: Insufficient documentation

## 2018-04-26 DIAGNOSIS — I10 Essential (primary) hypertension: Secondary | ICD-10-CM | POA: Insufficient documentation

## 2018-04-26 DIAGNOSIS — C649 Malignant neoplasm of unspecified kidney, except renal pelvis: Secondary | ICD-10-CM | POA: Insufficient documentation

## 2018-04-26 DIAGNOSIS — I2510 Atherosclerotic heart disease of native coronary artery without angina pectoris: Secondary | ICD-10-CM | POA: Diagnosis not present

## 2018-04-26 DIAGNOSIS — Z923 Personal history of irradiation: Secondary | ICD-10-CM | POA: Diagnosis not present

## 2018-04-26 DIAGNOSIS — D508 Other iron deficiency anemias: Secondary | ICD-10-CM | POA: Insufficient documentation

## 2018-04-26 DIAGNOSIS — Z79899 Other long term (current) drug therapy: Secondary | ICD-10-CM | POA: Insufficient documentation

## 2018-04-26 DIAGNOSIS — E119 Type 2 diabetes mellitus without complications: Secondary | ICD-10-CM | POA: Diagnosis not present

## 2018-04-26 DIAGNOSIS — K7460 Unspecified cirrhosis of liver: Secondary | ICD-10-CM | POA: Insufficient documentation

## 2018-04-26 DIAGNOSIS — Z8673 Personal history of transient ischemic attack (TIA), and cerebral infarction without residual deficits: Secondary | ICD-10-CM | POA: Diagnosis not present

## 2018-04-26 DIAGNOSIS — E785 Hyperlipidemia, unspecified: Secondary | ICD-10-CM | POA: Diagnosis not present

## 2018-04-26 DIAGNOSIS — N2889 Other specified disorders of kidney and ureter: Secondary | ICD-10-CM

## 2018-04-26 DIAGNOSIS — K5730 Diverticulosis of large intestine without perforation or abscess without bleeding: Secondary | ICD-10-CM | POA: Insufficient documentation

## 2018-04-26 DIAGNOSIS — Z85528 Personal history of other malignant neoplasm of kidney: Secondary | ICD-10-CM | POA: Insufficient documentation

## 2018-04-26 LAB — CBC WITH DIFFERENTIAL/PLATELET
BASOS PCT: 1 %
Basophils Absolute: 0 10*3/uL (ref 0.0–0.1)
Eosinophils Absolute: 0.3 10*3/uL (ref 0.0–0.7)
Eosinophils Relative: 6 %
HEMATOCRIT: 32.8 % — AB (ref 39.0–52.0)
Hemoglobin: 10.1 g/dL — ABNORMAL LOW (ref 13.0–17.0)
LYMPHS ABS: 0.7 10*3/uL (ref 0.7–4.0)
LYMPHS PCT: 17 %
MCH: 26.7 pg (ref 26.0–34.0)
MCHC: 30.8 g/dL (ref 30.0–36.0)
MCV: 86.8 fL (ref 78.0–100.0)
MONO ABS: 0.6 10*3/uL (ref 0.1–1.0)
MONOS PCT: 14 %
Neutro Abs: 2.7 10*3/uL (ref 1.7–7.7)
Neutrophils Relative %: 62 %
Platelets: 223 10*3/uL (ref 150–400)
RBC: 3.78 MIL/uL — ABNORMAL LOW (ref 4.22–5.81)
RDW: 16.7 % — AB (ref 11.5–15.5)
WBC: 4.3 10*3/uL (ref 4.0–10.5)

## 2018-04-26 LAB — COMPREHENSIVE METABOLIC PANEL
ALK PHOS: 77 U/L (ref 38–126)
ALT: 21 U/L (ref 17–63)
ANION GAP: 9 (ref 5–15)
AST: 28 U/L (ref 15–41)
Albumin: 3.6 g/dL (ref 3.5–5.0)
BILIRUBIN TOTAL: 0.7 mg/dL (ref 0.3–1.2)
BUN: 30 mg/dL — ABNORMAL HIGH (ref 6–20)
CALCIUM: 8.8 mg/dL — AB (ref 8.9–10.3)
CO2: 21 mmol/L — ABNORMAL LOW (ref 22–32)
Chloride: 110 mmol/L (ref 101–111)
Creatinine, Ser: 1.46 mg/dL — ABNORMAL HIGH (ref 0.61–1.24)
GFR calc Af Amer: 52 mL/min — ABNORMAL LOW (ref >60)
GFR, EST NON AFRICAN AMERICAN: 45 mL/min — AB (ref >60)
Glucose, Bld: 113 mg/dL — ABNORMAL HIGH (ref 65–99)
Potassium: 4.2 mmol/L (ref 3.5–5.1)
Sodium: 140 mmol/L (ref 135–145)
TOTAL PROTEIN: 7.9 g/dL (ref 6.5–8.1)

## 2018-05-01 ENCOUNTER — Ambulatory Visit (HOSPITAL_COMMUNITY): Payer: Medicare Other

## 2018-05-04 ENCOUNTER — Ambulatory Visit (HOSPITAL_COMMUNITY): Payer: Medicare Other | Admitting: Internal Medicine

## 2018-05-10 ENCOUNTER — Ambulatory Visit (HOSPITAL_COMMUNITY)
Admission: RE | Admit: 2018-05-10 | Discharge: 2018-05-10 | Disposition: A | Discharge status: Home / Self Care | Payer: Medicare Other | Source: Ambulatory Visit | Attending: Oncology | Admitting: Oncology

## 2018-05-10 DIAGNOSIS — I2510 Atherosclerotic heart disease of native coronary artery without angina pectoris: Secondary | ICD-10-CM | POA: Diagnosis not present

## 2018-05-10 DIAGNOSIS — Z923 Personal history of irradiation: Secondary | ICD-10-CM | POA: Diagnosis not present

## 2018-05-10 DIAGNOSIS — K5730 Diverticulosis of large intestine without perforation or abscess without bleeding: Secondary | ICD-10-CM | POA: Diagnosis not present

## 2018-05-10 DIAGNOSIS — K458 Other specified abdominal hernia without obstruction or gangrene: Secondary | ICD-10-CM | POA: Diagnosis not present

## 2018-05-10 DIAGNOSIS — I700 Atherosclerosis of aorta: Secondary | ICD-10-CM | POA: Diagnosis not present

## 2018-05-10 DIAGNOSIS — R599 Enlarged lymph nodes, unspecified: Secondary | ICD-10-CM | POA: Diagnosis not present

## 2018-05-10 DIAGNOSIS — C649 Malignant neoplasm of unspecified kidney, except renal pelvis: Secondary | ICD-10-CM | POA: Diagnosis present

## 2018-05-10 DIAGNOSIS — K8020 Calculus of gallbladder without cholecystitis without obstruction: Secondary | ICD-10-CM | POA: Diagnosis not present

## 2018-05-10 DIAGNOSIS — K7460 Unspecified cirrhosis of liver: Secondary | ICD-10-CM | POA: Diagnosis not present

## 2018-05-10 MED ORDER — IOPAMIDOL (ISOVUE-300) INJECTION 61%
100.0000 mL | Freq: Once | INTRAVENOUS | Status: AC | PRN
  Administered 2018-05-10: 100 mL via INTRAVENOUS

## 2018-05-10 NOTE — Progress Notes (Signed)
Called patient at home to remind patient not to take his Metformin for 48 hrs./bbj

## 2018-05-14 ENCOUNTER — Other Ambulatory Visit: Payer: Self-pay

## 2018-05-14 ENCOUNTER — Inpatient Hospital Stay (HOSPITAL_BASED_OUTPATIENT_CLINIC_OR_DEPARTMENT_OTHER): Payer: Medicare Other | Admitting: Internal Medicine

## 2018-05-14 ENCOUNTER — Encounter (HOSPITAL_COMMUNITY): Payer: Self-pay | Admitting: Internal Medicine

## 2018-05-14 VITALS — BP 133/81 | HR 80 | Temp 98.6°F | Resp 18 | Wt 205.9 lb

## 2018-05-14 DIAGNOSIS — C61 Malignant neoplasm of prostate: Secondary | ICD-10-CM | POA: Diagnosis not present

## 2018-05-14 DIAGNOSIS — I2510 Atherosclerotic heart disease of native coronary artery without angina pectoris: Secondary | ICD-10-CM | POA: Diagnosis not present

## 2018-05-14 DIAGNOSIS — K7460 Unspecified cirrhosis of liver: Secondary | ICD-10-CM

## 2018-05-14 DIAGNOSIS — Z905 Acquired absence of kidney: Secondary | ICD-10-CM | POA: Diagnosis not present

## 2018-05-14 DIAGNOSIS — D508 Other iron deficiency anemias: Secondary | ICD-10-CM

## 2018-05-14 DIAGNOSIS — E785 Hyperlipidemia, unspecified: Secondary | ICD-10-CM

## 2018-05-14 DIAGNOSIS — Z85528 Personal history of other malignant neoplasm of kidney: Secondary | ICD-10-CM

## 2018-05-14 DIAGNOSIS — Z79899 Other long term (current) drug therapy: Secondary | ICD-10-CM

## 2018-05-14 DIAGNOSIS — K5730 Diverticulosis of large intestine without perforation or abscess without bleeding: Secondary | ICD-10-CM

## 2018-05-14 DIAGNOSIS — Z8673 Personal history of transient ischemic attack (TIA), and cerebral infarction without residual deficits: Secondary | ICD-10-CM

## 2018-05-14 DIAGNOSIS — E119 Type 2 diabetes mellitus without complications: Secondary | ICD-10-CM | POA: Diagnosis not present

## 2018-05-14 DIAGNOSIS — K458 Other specified abdominal hernia without obstruction or gangrene: Secondary | ICD-10-CM

## 2018-05-14 DIAGNOSIS — Z7982 Long term (current) use of aspirin: Secondary | ICD-10-CM

## 2018-05-14 DIAGNOSIS — C649 Malignant neoplasm of unspecified kidney, except renal pelvis: Secondary | ICD-10-CM

## 2018-05-14 DIAGNOSIS — I10 Essential (primary) hypertension: Secondary | ICD-10-CM | POA: Diagnosis not present

## 2018-05-14 DIAGNOSIS — Z923 Personal history of irradiation: Secondary | ICD-10-CM

## 2018-05-14 DIAGNOSIS — Z7984 Long term (current) use of oral hypoglycemic drugs: Secondary | ICD-10-CM

## 2018-05-14 DIAGNOSIS — H409 Unspecified glaucoma: Secondary | ICD-10-CM

## 2018-05-14 NOTE — Patient Instructions (Signed)
[facility] at [facility] Discharge Instructions  Today you saw Dr. HIggs.    Thank you for choosing  [facility] at [facility] to provide your oncology and hematology care.  To afford each patient quality time with our provider, please arrive at least 15 minutes before your scheduled appointment time.   If you have a lab appointment with the Cancer Center please come in thru the  Main Entrance and check in at the main information desk  You need to re-schedule your appointment should you arrive 10 or more minutes late.  We strive to give you quality time with our providers, and arriving late affects you and other patients whose appointments are after yours.  Also, if you no show three or more times for appointments you may be dismissed from the clinic at the providers discretion.     Again, thank you for choosing [facility].  Our hope is that these requests will decrease the amount of time that you wait before being seen by our physicians.       _____________________________________________________________  Should you have questions after your visit to  Cancer Center, please contact our office at [phone] between the hours of 8:30 a.m. and 4:30 p.m.  Voicemails left after 4:30 p.m. will not be returned until the following business day.  For prescription refill requests, have your pharmacy contact our office.       Resources For Cancer Patients and their Caregivers ? [employer]: Can assist with transportation, wigs, general needs, runs Look Good Feel Better.        1-888-227-6333 ? Cancer Care: Provides financial assistance, online support groups, medication/co-pay assistance.  1-800-813-HOPE (4673) ? [facility] Assists [place] cancer patients and their families through emotional , educational and financial support.  336-427-4357 ? [place] DSS Where to apply for  food stamps, Medicaid and utility assistance. 336-342-1394 ? [facility]: Transportation to medical appointments. 336-347-2287 ? Social Security Administration: May apply for disability if have a Stage IV cancer. 336-342-7796 1-800-772-1213 ? [place] [facility]ging, Disability and Transit Services: Assists with nutrition, care and transit needs. 336-349-2343  Cancer Center Support Programs:   > Cancer Support Group  2nd Tuesday of the month 1pm-2pm, Journey Room   > Creative Journey  3rd Tuesday of the month 1130am-1pm, Journey Room    

## 2018-05-14 NOTE — Progress Notes (Signed)
Diagnosis Other iron deficiency anemia  Chromophobe renal cell carcinoma (HCC) - Plan: CBC with Differential/Platelet, Comprehensive metabolic panel, Lactate dehydrogenase, Hepatitis B surface antibody, Hepatitis B surface antigen, Hepatitis B core antibody, total, Hepatitis panel, acute, Hepatitis C RNA quantitative  Staging Cancer Staging Chromophobe renal cell carcinoma (HCC) Staging form: Kidney, AJCC 8th Edition - Clinical: Stage III (cT3a, cNX, cM0) - Signed by Twana First, MD on 02/13/2017 - Pathologic: No stage assigned - Unsigned    VISIT DIAGNOSIS:  Stage III (pT3a, pNx) chromophobe renal cell carcinoma. 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  Prostate cancer with high Gleason grade evaluated by Dr. Tammi Klippel, radiation oncologist (November, 2017) 3.  Remote history of sarcoidosis   Assessment and plan:  1.  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  Surgical path demonstrated Stage III (pT3a, pNx) chromophobe renal cell carcinoma.  He continues to follow-up with urology.    CT CAP done 05/10/2018 showed  IMPRESSION: 1. Stable mildly enlarged lymph nodes in the lower chest and upper abdomen, likely reactive given the patient's underlying cirrhosis. 2. No compelling evidence of active or progressive malignancy. No recurrence along the left nephrectomy site. 3. A left lumbar hernia contains a small portion of the colon, without complicating feature, similar to prior. 4. 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 Surveillance may be warranted as some of the lesions are difficult to characterize for enhancement. 5. 8 mm small focus of enhancement in the liver is stable from earliest available comparison of 12/09/2016, and is likely benign but may warrant surveillance. 6. Other imaging findings of potential clinical  significance: Aortic Atherosclerosis (ICD10-I70.0). Coronary atherosclerosis. Chronic coarse interstitial accentuation in the lungs. Cholelithiasis. Mild gallbladder wall thickening may be from contraction. Descending and sigmoid colon diverticulosis. Chronic presacral and perirectal wall thickening likely the result of prior radiation therapy. Prostate brachytherapy seed implants.  Scans reviewed with pt and wife today.  I discussed with him imaging done May 2019 showed no compelling evidence of active or progressive malignancy.  There was no recurrence along the left nephrectomy site.  He will RTC in 3 months for follow-up and repeat labs.  Anticipate repeat imaging in 10/2018 for follow-up.  He is advised  To notify the office if he has any problems prior to that time.    2. High Gleason grade prostate cancer.  He continues on ADT with lupron which is managed by Dr. Alyson Ingles. He was seen by Dr. Tammi Klippel for RT in 10/2017.  Follow-up with RT and urology as recommended.    3.  Right renal mass.  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  Scan showed 1.6 by 1.4 cm hypodense lesion of the right kidney lower pole medially is stable.  No appreciable size change from 05/15/2017.  He will have ongoing imaging in 10/2018 and should continue follow-up with Urology as recommended.    4.  Liver changes.  This was noted on CT.  Pt denies any history of cirrhosis.  Will check Hepatitis labs on RTC.  Will continue to monitor and he will have repeat imaging in 10/2018.  LFTs WNL on labs done 04/26/2018.    5.  Sarcoidosis.  Follow-up with pulmonary as recommended.  Recent CT of chest showed lung scarring.  6.  DM.  Continue to follow-up with PCP.    Interval History:  77 yr old male previously followed by Dr. Talbert Cage and was  found to have an enlarged prostate and a biopsy was  done. Biopsy revealed high Gleason grade carcinoma of prostate. Patient was referred to Dr Tammi Klippel. Mr. Mincey During that evaluation a CT scan revealed left kidney mass with the adjoining nodal mass.  There are multiple other small lymph nodes. He underwent left open radical nephrectomy with Dr. Alyson Ingles at the end of January 2018. Surgical path demonstrated Stage III (pT3a, pNx) chromophobe renal cell carcinoma.  He continues to follow-up with urology.      Current Status:  Pt is seen today for follow-up.  He is here to go over CT scans.  He reports he got 6 month ADT shot at last urology visit.    Chromophobe renal cell carcinoma ([place])   02/13/2017 Initial Diagnosis    Chromophobe renal cell carcinoma ([place])      05/15/2017 Imaging    IMPRESSION: 1. Status post left nephrectomy. No findings to suggest local recurrence of disease and no definite metastatic disease in the chest, abdomen or pelvis. 2. Densely calcified exophytic mass in the lower pole of the right kidney and 2 cm indeterminate lesion in the anterior aspect of the upper pole of the right kidney are both similar to the prior study. These are favored to be benign, but close attention on followup studies is recommended to ensure stability.        Problem List Patient Active Problem List   Diagnosis Date Noted  . Dizziness [R42] 08/21/2017  . Chromophobe renal cell carcinoma ([place]) [C64.9] 02/13/2017  . Left renal mass [N28.89] 01/20/2017  . Mass of left kidney [N28.89] 12/22/2016  . Prostate cancer ([place]) [C61] 12/22/2016  . Acute sinusitis [J01.90] 06/21/2015  . Essential hypertension, benign [I10] 06/21/2015  . Hyperlipidemia associated with type 2 diabetes mellitus ([place]) [E11.69, E78.5] 06/21/2015  . Type II diabetes mellitus, uncontrolled ([place]) [E11.65] 06/21/2015  . Brain TIA [G45.9] 06/19/2015  . Rectal bleeding [K62.5] 04/29/2013    Past Medical History Past Medical History:  Diagnosis Date  . Anemia   .  Diverticulosis of colon   . Frequency of urination   . Glaucoma, both eyes   . Heart murmur   . History of adenomatous polyp of colon 05/16/2013  . History of transient ischemic attack (TIA) 06/19/2015   no residual  . Hot flashes   . Hyperlipidemia   . Nocturia   . Prostate cancer [facility]) urologist-  dr Alyson Ingles  oncologist -- dr Tammi Klippel   dx [id number] via bx--- Gleason 4+5,  PSA 14.1, vol 52cc/  08-16-2017 last PSA  1.3 (per dr Alyson Ingles office note)    . Renal cell carcinoma, left [facility]) oncologist-  dr Barbaraann Share zhou ([facility])/  urologist-  dr Alyson Ingles   Stage III (pT3a, pN0)  Chromophobe RCC into perinephric fat /  01-20-2017 s/p  open radical left nephrecotmy/- per [facility] oncologist note -- no evidence of disease  . Sarcoidosis    per pt year 1980--- last Chest CT 05-15-2017  . Type 2 diabetes mellitus ([place])   . Wears partial dentures    upper and lower    Past Surgical History Past Surgical History:  Procedure Laterality Date  . COLONOSCOPY N/A 05/16/2013   Procedure: COLONOSCOPY;  Surgeon: Daneil Dolin, MD;  [facility];  Service: Endoscopy;  Laterality: N/A;  9:30  .  cyst on left shoulder  yrs ago  . CYSTOSCOPY N/A 10/10/2017   Procedure: CYSTOSCOPY FLEXIBLE;  Surgeon: Cleon Gustin, MD;  Location: [facility];  Service: Urology;  Laterality: N/A;  2 seeds removed from bladder per Dr Alyson Ingles  . GOLD SEED IMPLANT N/A 10/10/2017   Procedure: GOLD SEED IMPLANT;  Surgeon: Cleon Gustin, MD;  Location: [facility];  Service: Urology;  Laterality: N/A;  3 gold seeds implanted  . MOLE REMOVAL     lower back by dr Romona Curls  . NEPHRECTOMY Left 01/20/2017   Procedure: OPEN NEPHRECTOMY;  Surgeon: Cleon Gustin, MD;  Location: [facility];  Service: Urology;  Laterality: Left;  . RADIOACTIVE SEED IMPLANT N/A 10/10/2017   Procedure: RADIOACTIVE SEED IMPLANT/BRACHYTHERAPY IMPLANT;  Surgeon: Cleon Gustin, MD;  Location:  [facility];  Service: Urology;  Laterality: N/A;  57 seeds implanted  . SPACE OAR INSTILLATION N/A 10/10/2017   Procedure: SPACE OAR INSTILLATION;  Surgeon: Cleon Gustin, MD;  Location: [facility];  Service: Urology;  Laterality: N/A;  . TRANSTHORACIC ECHOCARDIOGRAM  06/20/2015   mild to moderate LVH, ef 60-65%/  trivial MR    Family History Family History  Problem Relation Age of Onset  . Liver disease Father        NASH? age 53  . Diabetes Father   . Colon cancer Neg Hx      Social History  reports that he has never smoked. He has never used smokeless tobacco. He reports that he does not drink alcohol or use drugs.  Medications  Current Outpatient Medications:  .  aspirin EC 81 MG tablet, Take 81 mg by mouth every evening., Disp: , Rfl:  .  atorvastatin (LIPITOR) 80 MG tablet, Take 1 tablet (80 mg total) by mouth daily at 6 PM. (Patient taking differently: Take 80 mg by mouth every evening. ), Disp: 30 tablet, Rfl: 12 .  AZOPT 1 % ophthalmic suspension, Place 1 drop into both eyes 2 (two) times daily. , Disp: , Rfl:  .  HYDROcodone-acetaminophen (NORCO) 5-325 MG tablet, Take 1 tablet by mouth every 6 (six) hours as needed for moderate pain., Disp: 30 tablet, Rfl: 0 .  Leuprolide Acetate (LUPRON IJ), Inject as directed every 3 (three) months., Disp: , Rfl:  .  lisinopril (PRINIVIL,ZESTRIL) 2.5 MG tablet, Take 1 tablet (2.5 mg total) by mouth daily. (Patient taking differently: Take 2.5 mg by mouth every evening. ), Disp: 30 tablet, Rfl: 12 .  metFORMIN (GLUCOPHAGE) 500 MG tablet, Take 1 tablet (500 mg total) by mouth 2 (two) times daily with a meal. (Patient taking differently: Take 500 mg by mouth every evening. ), Disp: 60 tablet, Rfl: 5 .  TRAVATAN Z 0.004 % SOLN ophthalmic solution, Place 1 drop into both eyes at bedtime. , Disp: , Rfl:  No current facility-administered medications for this visit.   Facility-Administered Medications Ordered  in Other Visits:  .  magnesium citrate solution 1 Bottle, 1 Bottle, Oral, Once, McKenzie, Candee Furbish, MD  Allergies Patient has no known allergies.  Review of Systems Review of Systems - Oncology ROS as per HPI otherwise 12 point ROS is negative.   Physical Exam  Vitals Wt Readings from Last 3 Encounters:  05/14/18 205 lb 14.4 oz (93.4 kg)  01/17/18 197 lb (89.4 kg)  12/29/17 199 lb 8 oz (90.5 kg)   Temp Readings from Last 3 Encounters:  05/14/18 98.6 F (37 C) (Oral)  01/17/18  98.4 F (36.9 C) (Oral)  12/29/17 98.8 F (37.1 C)   BP Readings from Last 3 Encounters:  05/14/18 133/81  01/17/18 139/74  12/29/17 (!) 146/64   Pulse Readings from Last 3 Encounters:  05/14/18 80  01/17/18 77  12/29/17 72   Constitutional: Well-developed, well-nourished, and in no distress.   HENT: Head: Normocephalic and atraumatic.  Mouth/Throat: No oropharyngeal exudate. Mucosa moist. Eyes: Pupils are equal, round, and reactive to light. Conjunctivae are normal. No scleral icterus.  Neck: Normal range of motion. Neck supple. No JVD present.  Cardiovascular: Normal rate, regular rhythm and normal heart sounds.  Exam reveals no gallop and no friction rub.   No murmur heard. Pulmonary/Chest: Effort normal and breath sounds normal. No respiratory distress. No wheezes.No rales.  Abdominal: Soft. Bowel sounds are normal. No distension. There is no tenderness. There is no guarding.  Musculoskeletal: No edema or tenderness.  Lymphadenopathy: No cervical, axillary or supraclavicular adenopathy.  Neurological: Alert and oriented to person, place, and time. No cranial nerve deficit.  Skin: Skin is warm and dry. No rash noted. No erythema. No pallor.  Psychiatric: Affect and judgment normal.   Labs No visits with results within 3 Day(s) from this visit.  Latest known visit with results is:  Appointment on 04/26/2018  Component Date Value Ref Range Status  . WBC 04/26/2018 4.3  4.0 - 10.5 K/uL  Final  . RBC 04/26/2018 3.78* 4.22 - 5.81 MIL/uL Final  . Hemoglobin 04/26/2018 10.1* 13.0 - 17.0 g/dL Final  . HCT 04/26/2018 32.8* 39.0 - 52.0 % Final  . MCV 04/26/2018 86.8  78.0 - 100.0 fL Final  . MCH 04/26/2018 26.7  26.0 - 34.0 pg Final  . MCHC 04/26/2018 30.8  30.0 - 36.0 g/dL Final  . RDW 04/26/2018 16.7* 11.5 - 15.5 % Final  . Platelets 04/26/2018 223  150 - 400 K/uL Final  . Neutrophils Relative % 04/26/2018 62  % Final  . Neutro Abs 04/26/2018 2.7  1.7 - 7.7 K/uL Final  . Lymphocytes Relative 04/26/2018 17  % Final  . Lymphs Abs 04/26/2018 0.7  0.7 - 4.0 K/uL Final  . Monocytes Relative 04/26/2018 14  % Final  . Monocytes Absolute 04/26/2018 0.6  0.1 - 1.0 K/uL Final  . Eosinophils Relative 04/26/2018 6  % Final  . Eosinophils Absolute 04/26/2018 0.3  0.0 - 0.7 K/uL Final  . Basophils Relative 04/26/2018 1  % Final  . Basophils Absolute 04/26/2018 0.0  0.0 - 0.1 K/uL Final   Performed at [facility], [address] [place], [place] [address]  . Sodium 04/26/2018 140  135 - 145 mmol/L Final  . Potassium 04/26/2018 4.2  3.5 - 5.1 mmol/L Final  . Chloride 04/26/2018 110  101 - 111 mmol/L Final  . CO2 04/26/2018 21* 22 - 32 mmol/L Final  . Glucose, Bld 04/26/2018 113* 65 - 99 mg/dL Final  . BUN 04/26/2018 30* 6 - 20 mg/dL Final  . Creatinine, Ser 04/26/2018 1.46* 0.61 - 1.24 mg/dL Final  . Calcium 04/26/2018 8.8* 8.9 - 10.3 mg/dL Final  . Total Protein 04/26/2018 7.9  6.5 - 8.1 g/dL Final  . Albumin 04/26/2018 3.6  3.5 - 5.0 g/dL Final  . AST 04/26/2018 28  15 - 41 U/L Final  . ALT 04/26/2018 21  17 - 63 U/L Final  . Alkaline Phosphatase 04/26/2018 77  38 - 126 U/L Final  . Total Bilirubin 04/26/2018 0.7  0.3 - 1.2 mg/dL Final  . GFR  calc non Af Amer 04/26/2018 45* >60 mL/min Final  . GFR calc Af Amer 04/26/2018 52* >60 mL/min Final   Comment: (NOTE) The eGFR has been calculated using the CKD EPI equation. This calculation has not been validated in all clinical  situations. eGFR's persistently <60 mL/min signify possible Chronic Kidney Disease.   Georgiann Hahn gap 04/26/2018 9  5 - 15 Final   Performed at [facility], [address] [place], [place] [address]     Pathology Orders Placed This Encounter  Procedures  . CBC with Differential/Platelet    Standing Status:   Future    Standing Expiration Date:   05/15/2019  . Comprehensive metabolic panel    Standing Status:   Future    Standing Expiration Date:   05/15/2019  . Lactate dehydrogenase    Standing Status:   Future    Standing Expiration Date:   05/15/2019  . Hepatitis B surface antibody    Standing Status:   Future    Standing Expiration Date:   05/15/2019  . Hepatitis B surface antigen    Standing Status:   Future    Standing Expiration Date:   05/15/2019  . Hepatitis B core antibody, total    Standing Status:   Future    Standing Expiration Date:   05/15/2019  . Hepatitis panel, acute    Standing Status:   Future    Standing Expiration Date:   05/15/2019  . Hepatitis C RNA quantitative    Standing Status:   Future    Standing Expiration Date:   05/15/2019       Zoila Shutter MD

## 2018-05-30 ENCOUNTER — Ambulatory Visit (INDEPENDENT_AMBULATORY_CARE_PROVIDER_SITE_OTHER): Payer: Medicare Other | Admitting: Urology

## 2018-05-30 DIAGNOSIS — C642 Malignant neoplasm of left kidney, except renal pelvis: Secondary | ICD-10-CM | POA: Diagnosis not present

## 2018-05-30 DIAGNOSIS — C61 Malignant neoplasm of prostate: Secondary | ICD-10-CM

## 2018-08-30 ENCOUNTER — Inpatient Hospital Stay (HOSPITAL_COMMUNITY): Payer: Medicare Other | Attending: Hematology

## 2018-08-30 DIAGNOSIS — C642 Malignant neoplasm of left kidney, except renal pelvis: Secondary | ICD-10-CM | POA: Insufficient documentation

## 2018-08-30 DIAGNOSIS — Z905 Acquired absence of kidney: Secondary | ICD-10-CM | POA: Diagnosis not present

## 2018-08-30 DIAGNOSIS — C649 Malignant neoplasm of unspecified kidney, except renal pelvis: Secondary | ICD-10-CM

## 2018-08-30 LAB — CBC WITH DIFFERENTIAL/PLATELET
BASOS ABS: 0 10*3/uL (ref 0.0–0.1)
Basophils Relative: 0 %
EOS PCT: 4 %
Eosinophils Absolute: 0.2 10*3/uL (ref 0.0–0.7)
HCT: 33.5 % — ABNORMAL LOW (ref 39.0–52.0)
HEMOGLOBIN: 10.4 g/dL — AB (ref 13.0–17.0)
LYMPHS ABS: 0.8 10*3/uL (ref 0.7–4.0)
LYMPHS PCT: 15 %
MCH: 26.9 pg (ref 26.0–34.0)
MCHC: 31 g/dL (ref 30.0–36.0)
MCV: 86.8 fL (ref 78.0–100.0)
Monocytes Absolute: 0.7 10*3/uL (ref 0.1–1.0)
Monocytes Relative: 14 %
NEUTROS PCT: 67 %
Neutro Abs: 3.4 10*3/uL (ref 1.7–7.7)
PLATELETS: 204 10*3/uL (ref 150–400)
RBC: 3.86 MIL/uL — AB (ref 4.22–5.81)
RDW: 17 % — ABNORMAL HIGH (ref 11.5–15.5)
WBC: 5.1 10*3/uL (ref 4.0–10.5)

## 2018-08-30 LAB — COMPREHENSIVE METABOLIC PANEL
ALK PHOS: 71 U/L (ref 38–126)
ALT: 20 U/L (ref 0–44)
AST: 26 U/L (ref 15–41)
Albumin: 3.8 g/dL (ref 3.5–5.0)
Anion gap: 10 (ref 5–15)
BUN: 26 mg/dL — AB (ref 8–23)
CHLORIDE: 105 mmol/L (ref 98–111)
CO2: 25 mmol/L (ref 22–32)
CREATININE: 1.51 mg/dL — AB (ref 0.61–1.24)
Calcium: 9.1 mg/dL (ref 8.9–10.3)
GFR calc non Af Amer: 43 mL/min — ABNORMAL LOW (ref >60)
GFR, EST AFRICAN AMERICAN: 50 mL/min — AB (ref >60)
Glucose, Bld: 176 mg/dL — ABNORMAL HIGH (ref 70–99)
Potassium: 4.5 mmol/L (ref 3.5–5.1)
Sodium: 140 mmol/L (ref 135–145)
Total Bilirubin: 0.7 mg/dL (ref 0.3–1.2)
Total Protein: 8 g/dL (ref 6.5–8.1)

## 2018-08-30 LAB — LACTATE DEHYDROGENASE: LDH: 146 U/L (ref 98–192)

## 2018-08-31 LAB — HEPATITIS PANEL, ACUTE
HEP B C IGM: NEGATIVE
HEP B S AG: NEGATIVE
Hep A IgM: NEGATIVE

## 2018-08-31 LAB — HEPATITIS B SURFACE ANTIBODY,QUALITATIVE: Hep B S Ab: NONREACTIVE

## 2018-08-31 LAB — HEPATITIS B CORE ANTIBODY, TOTAL: Hep B Core Total Ab: NEGATIVE

## 2018-08-31 LAB — HEPATITIS B SURFACE ANTIGEN: Hepatitis B Surface Ag: NEGATIVE

## 2018-09-06 ENCOUNTER — Inpatient Hospital Stay (HOSPITAL_BASED_OUTPATIENT_CLINIC_OR_DEPARTMENT_OTHER): Payer: Medicare Other | Admitting: Internal Medicine

## 2018-09-06 ENCOUNTER — Encounter (HOSPITAL_COMMUNITY): Payer: Self-pay | Admitting: Internal Medicine

## 2018-09-06 VITALS — BP 155/75 | HR 81 | Temp 98.3°F | Resp 14 | Wt 206.9 lb

## 2018-09-06 DIAGNOSIS — Z905 Acquired absence of kidney: Secondary | ICD-10-CM

## 2018-09-06 DIAGNOSIS — C649 Malignant neoplasm of unspecified kidney, except renal pelvis: Secondary | ICD-10-CM

## 2018-09-06 DIAGNOSIS — C642 Malignant neoplasm of left kidney, except renal pelvis: Secondary | ICD-10-CM

## 2018-09-06 NOTE — Patient Instructions (Signed)
[facility] at [facility] Discharge Instructions  You saw Dr. Higgs today.   Thank you for choosing [facility] at [facility] to provide your oncology and hematology care.  To afford each patient quality time with our provider, please arrive at least 15 minutes before your scheduled appointment time.   If you have a lab appointment with the Cancer Center please come in thru the  Main Entrance and check in at the main information desk  You need to re-schedule your appointment should you arrive 10 or more minutes late.  We strive to give you quality time with our providers, and arriving late affects you and other patients whose appointments are after yours.  Also, if you no show three or more times for appointments you may be dismissed from the clinic at the providers discretion.     Again, thank you for choosing [facility].  Our hope is that these requests will decrease the amount of time that you wait before being seen by our physicians.       _____________________________________________________________  Should you have questions after your visit to [facility], please contact our office at [phone] between the hours of 8:00 a.m. and 4:30 p.m.  Voicemails left after 4:00 p.m. will not be returned until the following business day.  For prescription refill requests, have your pharmacy contact our office and allow 72 hours.    Cancer Center Support Programs:   > Cancer Support Group  2nd Tuesday of the month 1pm-2pm, Journey Room    

## 2018-09-06 NOTE — Progress Notes (Signed)
Diagnosis Chromophobe renal cell carcinoma (HCC) - Plan: CT CHEST W CONTRAST, CT Abdomen Pelvis W Contrast, CBC with Differential/Platelet, Comprehensive metabolic panel, Lactate dehydrogenase, Ferritin, Protein electrophoresis, serum  Staging Cancer Staging Chromophobe renal cell carcinoma ([place]) Staging form: Kidney, AJCC 8th Edition - Clinical: Stage III (cT3a, cNX, cM0) - Signed by Twana First, MD on 02/13/2017 - Pathologic: No stage assigned - Unsigned   Assessment and Plan:  1.  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  Surgical path demonstrated Stage III (pT3a, pNx) chromophobe renal cell carcinoma.  He continues to follow-up with urology.    CT CAP done 05/10/2018 showed  IMPRESSION: 1. Stable mildly enlarged lymph nodes in the lower chest and upper abdomen, likely reactive given the patient's underlying cirrhosis. 2. No compelling evidence of active or progressive malignancy. No recurrence along the left nephrectomy site. 3. A left lumbar hernia contains a small portion of the colon, without complicating feature, similar to prior. 4. 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 Surveillance may be warranted as some of the lesions are difficult to characterize for enhancement. 5. 8 mm small focus of enhancement in the liver is stable from earliest available comparison of 12/09/2016, and is likely benign but may warrant surveillance. 6. Other imaging findings of potential clinical significance: Aortic Atherosclerosis (ICD10-I70.0). Coronary atherosclerosis. Chronic coarse interstitial accentuation in the lungs. Cholelithiasis. Mild gallbladder wall thickening may be from contraction. Descending and sigmoid colon diverticulosis. Chronic presacral and perirectal wall thickening likely the result of prior radiation therapy.  Prostate brachytherapy seed implants.  Pt is set up with CT CAP for repeat imaging in 10/2018 and he will follow-up at that time to go over results.  Labs done 08/30/2018 reviewed and showed WBC 5.1 HB 10.4 plts 204,000.  Chemistries WNL with K+ 4.5 and normal LFTS.    2. High Gleason grade prostate cancer.  He continues on ADT with lupron which is managed by Dr. Alyson Ingles. He was seen by Dr. Tammi Klippel for RT in 10/2017.  Follow-up with RT and urology as recommended.    3.  Right renal mass.  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  Scan showed 1.6 by 1.4 cm hypodense lesion of the right kidney lower pole medially is stable.  No appreciable size change from 05/15/2017.  He will be set up for CT CAP in 10/2018 and should continue follow-up with Urology as recommended.    4.  Liver changes.  This was noted on CT.  Pt denies any history of cirrhosis.  Hepatitis labs done 08/30/2018 are negative.  LFTs on labs done 08/30/2018 reviewed and WNL.  Pt set up for repeat imaging in 10/2018.    5.  Sarcoidosis.  Follow-up with pulmonary as recommended.  CT of chest showed lung scarring.    6.  DM.  Follow-up with PCP.    7.  RI.  Cr 1.5.  Will repeat chemistries on RTC.    Interval History:  Historical data obtained from note dated 05/14/2018.  77 yr old male previously followed by Dr. Talbert Cage and was  found to have an enlarged prostate and a biopsy was done. Biopsy revealed high Gleason grade carcinoma of prostate. Patient was referred to Dr Tammi Klippel. Mr. Mccravy During that evaluation a CT scan revealed left kidney  mass with the adjoining nodal mass.  There are multiple other small lymph nodes. He underwent left open radical nephrectomy with Dr. Alyson Ingles at the end of January 2018. Surgical path demonstrated Stage III (pT3a, pNx) chromophobe renal cell carcinoma.  He continues to  follow-up with urology.    Current Status:  Pt is seen today for follow-up.  He is here to go over labs.  -   Chromophobe renal cell carcinoma ([place])   02/13/2017 Initial Diagnosis    Chromophobe renal cell carcinoma ([place])    05/15/2017 Imaging    IMPRESSION: 1. Status post left nephrectomy. No findings to suggest local recurrence of disease and no definite metastatic disease in the chest, abdomen or pelvis. 2. Densely calcified exophytic mass in the lower pole of the right kidney and 2 cm indeterminate lesion in the anterior aspect of the upper pole of the right kidney are both similar to the prior study. These are favored to be benign, but close attention on followup studies is recommended to ensure stability.      Problem List Patient Active Problem List   Diagnosis Date Noted  . Dizziness [R42] 08/21/2017  . Chromophobe renal cell carcinoma ([place]) [C64.9] 02/13/2017  . Left renal mass [N28.89] 01/20/2017  . Mass of left kidney [N28.89] 12/22/2016  . Prostate cancer ([place]) [C61] 12/22/2016  . Acute sinusitis [J01.90] 06/21/2015  . Essential hypertension, benign [I10] 06/21/2015  . Hyperlipidemia associated with type 2 diabetes mellitus ([place]) [E11.69, E78.5] 06/21/2015  . Type II diabetes mellitus, uncontrolled ([place]) [E11.65] 06/21/2015  . Brain TIA [G45.9] 06/19/2015  . Rectal bleeding [K62.5] 04/29/2013    Past Medical History Past Medical History:  Diagnosis Date  . Anemia   . Diverticulosis of colon   . Frequency of urination   . Glaucoma, both eyes   . Heart murmur   . History of adenomatous polyp of colon 05/16/2013  . History of transient ischemic attack (TIA) 06/19/2015   no residual  . Hot flashes   . Hyperlipidemia   . Nocturia   . Prostate cancer [facility]) urologist-  dr Alyson Ingles  oncologist -- dr Tammi Klippel   dx [id number] via bx--- Gleason 4+5,  PSA 14.1, vol 52cc/  08-16-2017 last PSA  1.3 (per dr Alyson Ingles office note)    . Renal cell carcinoma, left [facility])  oncologist-  dr Barbaraann Share zhou ([facility])/  urologist-  dr Alyson Ingles   Stage III (pT3a, pN0)  Chromophobe RCC into perinephric fat /  01-20-2017 s/p  open radical left nephrecotmy/- per [facility] oncologist note -- no evidence of disease  . Sarcoidosis    per pt year 1980--- last Chest CT 05-15-2017  . Type 2 diabetes mellitus ([place])   . Wears partial dentures    upper and lower    Past Surgical History Past Surgical History:  Procedure Laterality Date  . COLONOSCOPY N/A 05/16/2013   Procedure: COLONOSCOPY;  Surgeon: Daneil Dolin, MD;  [facility];  Service: Endoscopy;  Laterality: N/A;  9:30  . cyst on left shoulder  yrs ago  . CYSTOSCOPY N/A 10/10/2017   Procedure: CYSTOSCOPY FLEXIBLE;  Surgeon: Cleon Gustin, MD;  Location: [facility];  Service: Urology;  Laterality: N/A;  2 seeds removed from bladder per Dr Alyson Ingles  . GOLD SEED IMPLANT N/A 10/10/2017   Procedure: GOLD SEED IMPLANT;  Surgeon: Cleon Gustin, MD;  Location: [facility];  Service: Urology;  Laterality: N/A;  3 gold seeds implanted  .  MOLE REMOVAL     lower back by dr Romona Curls  . NEPHRECTOMY Left 01/20/2017   Procedure: OPEN NEPHRECTOMY;  Surgeon: Cleon Gustin, MD;  Location: [facility];  Service: Urology;  Laterality: Left;  . RADIOACTIVE SEED IMPLANT N/A 10/10/2017   Procedure: RADIOACTIVE SEED IMPLANT/BRACHYTHERAPY IMPLANT;  Surgeon: Cleon Gustin, MD;  Location: [facility];  Service: Urology;  Laterality: N/A;  57 seeds implanted  . SPACE OAR INSTILLATION N/A 10/10/2017   Procedure: SPACE OAR INSTILLATION;  Surgeon: Cleon Gustin, MD;  Location: [facility];  Service: Urology;  Laterality: N/A;  . TRANSTHORACIC ECHOCARDIOGRAM  06/20/2015   mild to moderate LVH, ef 60-65%/  trivial MR    Family History Family History  Problem Relation Age of Onset  . Liver disease Father        NASH? age 89  . Diabetes Father   .  Colon cancer Neg Hx      Social History  reports that he has never smoked. He has never used smokeless tobacco. He reports that he does not drink alcohol or use drugs.  Medications  Current Outpatient Medications:  .  aspirin EC 81 MG tablet, Take 81 mg by mouth every evening., Disp: , Rfl:  .  atorvastatin (LIPITOR) 80 MG tablet, Take 1 tablet (80 mg total) by mouth daily at 6 PM. (Patient taking differently: Take 80 mg by mouth every evening. ), Disp: 30 tablet, Rfl: 12 .  AZOPT 1 % ophthalmic suspension, Place 1 drop into both eyes 2 (two) times daily. , Disp: , Rfl:  .  HYDROcodone-acetaminophen (NORCO) 5-325 MG tablet, Take 1 tablet by mouth every 6 (six) hours as needed for moderate pain., Disp: 30 tablet, Rfl: 0 .  Leuprolide Acetate (LUPRON IJ), Inject as directed every 3 (three) months., Disp: , Rfl:  .  lisinopril (PRINIVIL,ZESTRIL) 2.5 MG tablet, Take 1 tablet (2.5 mg total) by mouth daily. (Patient taking differently: Take 2.5 mg by mouth every evening. ), Disp: 30 tablet, Rfl: 12 .  metFORMIN (GLUCOPHAGE) 500 MG tablet, Take 1 tablet (500 mg total) by mouth 2 (two) times daily with a meal. (Patient taking differently: Take 500 mg by mouth every evening. ), Disp: 60 tablet, Rfl: 5 .  TRAVATAN Z 0.004 % SOLN ophthalmic solution, Place 1 drop into both eyes at bedtime. , Disp: , Rfl:  No current facility-administered medications for this visit.   Facility-Administered Medications Ordered in Other Visits:  .  magnesium citrate solution 1 Bottle, 1 Bottle, Oral, Once, McKenzie, Candee Furbish, MD  Allergies Patient has no known allergies.  Review of Systems Review of Systems - Oncology ROS negative   Physical Exam  Vitals Wt Readings from Last 3 Encounters:  09/06/18 206 lb 14.4 oz (93.8 kg)  05/14/18 205 lb 14.4 oz (93.4 kg)  01/17/18 197 lb (89.4 kg)   Temp Readings from Last 3 Encounters:  09/06/18 98.3 F (36.8 C) (Oral)  05/14/18 98.6 F (37 C) (Oral)  01/17/18 98.4  F (36.9 C) (Oral)   BP Readings from Last 3 Encounters:  09/06/18 (!) 155/75  05/14/18 133/81  01/17/18 139/74   Pulse Readings from Last 3 Encounters:  09/06/18 81  05/14/18 80  01/17/18 77    Constitutional: Well-developed, well-nourished, and in no distress.   HENT: Head: Normocephalic and atraumatic.  Mouth/Throat: No oropharyngeal exudate. Mucosa moist. Eyes: Pupils are equal, round, and reactive to light. Conjunctivae are normal. No scleral icterus.  Neck: Normal range  of motion. Neck supple. No JVD present.  Cardiovascular: Normal rate, regular rhythm and normal heart sounds.  Exam reveals no gallop and no friction rub.   No murmur heard. Pulmonary/Chest: Effort normal and breath sounds normal. No respiratory distress. No wheezes.No rales.  Abdominal: Soft. Bowel sounds are normal. No distension. There is no tenderness. There is no guarding.  Musculoskeletal: No edema or tenderness.  Lymphadenopathy: No cervical, axillary or supraclavicular adenopathy.  Neurological: Alert and oriented to person, place, and time. No cranial nerve deficit.  Skin: Skin is warm and dry. No rash noted. No erythema. No pallor.  Psychiatric: Affect and judgment normal.   Labs No visits with results within 3 Day(s) from this visit.  Latest known visit with results is:  Appointment on 08/30/2018  Component Date Value Ref Range Status  . WBC 08/30/2018 5.1  4.0 - 10.5 K/uL Final  . RBC 08/30/2018 3.86* 4.22 - 5.81 MIL/uL Final  . Hemoglobin 08/30/2018 10.4* 13.0 - 17.0 g/dL Final  . HCT 08/30/2018 33.5* 39.0 - 52.0 % Final  . MCV 08/30/2018 86.8  78.0 - 100.0 fL Final  . MCH 08/30/2018 26.9  26.0 - 34.0 pg Final  . MCHC 08/30/2018 31.0  30.0 - 36.0 g/dL Final  . RDW 08/30/2018 17.0* 11.5 - 15.5 % Final  . Platelets 08/30/2018 204  150 - 400 K/uL Final  . Neutrophils Relative % 08/30/2018 67  % Final  . Neutro Abs 08/30/2018 3.4  1.7 - 7.7 K/uL Final  . Lymphocytes Relative 08/30/2018 15  %  Final  . Lymphs Abs 08/30/2018 0.8  0.7 - 4.0 K/uL Final  . Monocytes Relative 08/30/2018 14  % Final  . Monocytes Absolute 08/30/2018 0.7  0.1 - 1.0 K/uL Final  . Eosinophils Relative 08/30/2018 4  % Final  . Eosinophils Absolute 08/30/2018 0.2  0.0 - 0.7 K/uL Final  . Basophils Relative 08/30/2018 0  % Final  . Basophils Absolute 08/30/2018 0.0  0.0 - 0.1 K/uL Final   Performed at [facility], [address], [place], [place] [address]  . Sodium 08/30/2018 140  135 - 145 mmol/L Final  . Potassium 08/30/2018 4.5  3.5 - 5.1 mmol/L Final  . Chloride 08/30/2018 105  98 - 111 mmol/L Final  . CO2 08/30/2018 25  22 - 32 mmol/L Final  . Glucose, Bld 08/30/2018 176* 70 - 99 mg/dL Final  . BUN 08/30/2018 26* 8 - 23 mg/dL Final  . Creatinine, Ser 08/30/2018 1.51* 0.61 - 1.24 mg/dL Final  . Calcium 08/30/2018 9.1  8.9 - 10.3 mg/dL Final  . Total Protein 08/30/2018 8.0  6.5 - 8.1 g/dL Final  . Albumin 08/30/2018 3.8  3.5 - 5.0 g/dL Final  . AST 08/30/2018 26  15 - 41 U/L Final  . ALT 08/30/2018 20  0 - 44 U/L Final  . Alkaline Phosphatase 08/30/2018 71  38 - 126 U/L Final  . Total Bilirubin 08/30/2018 0.7  0.3 - 1.2 mg/dL Final  . GFR calc non Af Amer 08/30/2018 43* >60 mL/min Final  . GFR calc Af Amer 08/30/2018 50* >60 mL/min Final   Comment: (NOTE) The eGFR has been calculated using the CKD EPI equation. This calculation has not been validated in all clinical situations. eGFR's persistently <60 mL/min signify possible Chronic Kidney Disease.   Georgiann Hahn gap 08/30/2018 10  5 - 15 Final   Performed at [facility], [address] [place], [place] [address]  . LDH 08/30/2018 146  98 -  192 U/L Final   Performed at [facility], [address], [place], [place] [address]  . Hep B S Ab 08/30/2018 Non Reactive   Final   Comment: (NOTE)              Non Reactive: Inconsistent with immunity,                            less than 10 mIU/mL              Reactive:     Consistent with immunity,                             greater than 9.9 mIU/mL Performed At: [facility] [place], [place] [id number] Rush Farmer MD [id number]   . Hepatitis B Surface Ag 08/30/2018 Negative  Negative Final   Comment: (NOTE) Performed At: [facility] [place], [place] [id number] Rush Farmer MD [id number]   . Hep B Core Total Ab 08/30/2018 Negative  Negative Final   Comment: (NOTE) Performed At: [facility] [place], [place] [id number] Rush Farmer MD [id number]   . Hepatitis B Surface Ag 08/30/2018 Negative  Negative Final  . HCV Ab 08/30/2018 <0.1  0.0 - 0.9 s/co ratio Final   Comment: (NOTE)                                  Negative:     < 0.8                             Indeterminate: 0.8 - 0.9                                  Positive:     > 0.9 The CDC recommends that a positive HCV antibody result be followed up with a HCV Nucleic Acid Amplification test (638937). Performed At: [facility] [place], [place] [id number] Rush Farmer MD [id number]   . Hep A IgM 08/30/2018 Negative  Negative Final  . Hep B C IgM 08/30/2018 Negative  Negative Final     Pathology Orders Placed This Encounter  Procedures  . CT CHEST W CONTRAST    Standing Status:   Future    Standing Expiration Date:   09/06/2019    Order Specific Question:   If indicated for the ordered procedure, I authorize the administration of contrast media per Radiology protocol    Answer:   Yes    Order Specific Question:   Preferred imaging location?    Answer:   [facility]    Order Specific Question:   Radiology Contrast Protocol - do NOT remove file path    Answer:   \\[email or web address]  . CT Abdomen Pelvis W Contrast    Standing Status:   Future    Standing Expiration Date:   09/06/2019    Order Specific Question:   If indicated for the ordered procedure, I authorize the  administration of contrast media per Radiology protocol    Answer:   Yes    Order Specific Question:   Preferred imaging location?    Answer:   [facility]    Order Specific  Question:   Is Oral Contrast requested for this exam?    Answer:   Yes, Per Radiology protocol    Order Specific Question:   Radiology Contrast Protocol - do NOT remove file path    Answer:   \\[email or web address]  . CBC with Differential/Platelet    Standing Status:   Future    Standing Expiration Date:   09/07/2019  . Comprehensive metabolic panel    Standing Status:   Future    Standing Expiration Date:   09/07/2019  . Lactate dehydrogenase    Standing Status:   Future    Standing Expiration Date:   09/07/2019  . Ferritin    Standing Status:   Future    Standing Expiration Date:   09/07/2019  . Protein electrophoresis, serum    Standing Status:   Future    Standing Expiration Date:   09/07/2019       Zoila Shutter MD

## 2018-11-08 ENCOUNTER — Ambulatory Visit (HOSPITAL_COMMUNITY): Payer: Medicare Other | Admitting: Internal Medicine

## 2018-11-09 ENCOUNTER — Inpatient Hospital Stay (HOSPITAL_COMMUNITY): Payer: Medicare Other | Attending: Pulmonary Disease

## 2018-11-09 ENCOUNTER — Encounter (HOSPITAL_COMMUNITY): Payer: Self-pay

## 2018-11-09 ENCOUNTER — Ambulatory Visit (HOSPITAL_COMMUNITY)
Admission: RE | Admit: 2018-11-09 | Discharge: 2018-11-09 | Disposition: A | Discharge status: Home / Self Care | Payer: Medicare Other | Source: Ambulatory Visit | Attending: Internal Medicine | Admitting: Internal Medicine

## 2018-11-09 DIAGNOSIS — Z905 Acquired absence of kidney: Secondary | ICD-10-CM | POA: Insufficient documentation

## 2018-11-09 DIAGNOSIS — C61 Malignant neoplasm of prostate: Secondary | ICD-10-CM | POA: Diagnosis not present

## 2018-11-09 DIAGNOSIS — Z7982 Long term (current) use of aspirin: Secondary | ICD-10-CM | POA: Insufficient documentation

## 2018-11-09 DIAGNOSIS — I10 Essential (primary) hypertension: Secondary | ICD-10-CM | POA: Diagnosis not present

## 2018-11-09 DIAGNOSIS — D869 Sarcoidosis, unspecified: Secondary | ICD-10-CM | POA: Diagnosis not present

## 2018-11-09 DIAGNOSIS — Z8546 Personal history of malignant neoplasm of prostate: Secondary | ICD-10-CM | POA: Insufficient documentation

## 2018-11-09 DIAGNOSIS — E119 Type 2 diabetes mellitus without complications: Secondary | ICD-10-CM | POA: Insufficient documentation

## 2018-11-09 DIAGNOSIS — K5730 Diverticulosis of large intestine without perforation or abscess without bleeding: Secondary | ICD-10-CM | POA: Insufficient documentation

## 2018-11-09 DIAGNOSIS — C649 Malignant neoplasm of unspecified kidney, except renal pelvis: Secondary | ICD-10-CM | POA: Insufficient documentation

## 2018-11-09 DIAGNOSIS — Z7984 Long term (current) use of oral hypoglycemic drugs: Secondary | ICD-10-CM | POA: Diagnosis not present

## 2018-11-09 DIAGNOSIS — I700 Atherosclerosis of aorta: Secondary | ICD-10-CM | POA: Insufficient documentation

## 2018-11-09 DIAGNOSIS — C642 Malignant neoplasm of left kidney, except renal pelvis: Secondary | ICD-10-CM | POA: Diagnosis not present

## 2018-11-09 DIAGNOSIS — Z79899 Other long term (current) drug therapy: Secondary | ICD-10-CM | POA: Insufficient documentation

## 2018-11-09 DIAGNOSIS — K8020 Calculus of gallbladder without cholecystitis without obstruction: Secondary | ICD-10-CM | POA: Diagnosis not present

## 2018-11-09 LAB — CBC WITH DIFFERENTIAL/PLATELET
ABS IMMATURE GRANULOCYTES: 0.01 10*3/uL (ref 0.00–0.07)
BASOS ABS: 0 10*3/uL (ref 0.0–0.1)
Basophils Relative: 1 %
EOS PCT: 4 %
Eosinophils Absolute: 0.3 10*3/uL (ref 0.0–0.5)
HCT: 35.7 % — ABNORMAL LOW (ref 39.0–52.0)
Hemoglobin: 10.6 g/dL — ABNORMAL LOW (ref 13.0–17.0)
IMMATURE GRANULOCYTES: 0 %
LYMPHS ABS: 0.9 10*3/uL (ref 0.7–4.0)
LYMPHS PCT: 16 %
MCH: 26 pg (ref 26.0–34.0)
MCHC: 29.7 g/dL — AB (ref 30.0–36.0)
MCV: 87.5 fL (ref 80.0–100.0)
Monocytes Absolute: 0.8 10*3/uL (ref 0.1–1.0)
Monocytes Relative: 14 %
NEUTROS ABS: 3.7 10*3/uL (ref 1.7–7.7)
NRBC: 0 % (ref 0.0–0.2)
Neutrophils Relative %: 65 %
PLATELETS: 262 10*3/uL (ref 150–400)
RBC: 4.08 MIL/uL — ABNORMAL LOW (ref 4.22–5.81)
RDW: 16.8 % — AB (ref 11.5–15.5)
WBC: 5.6 10*3/uL (ref 4.0–10.5)

## 2018-11-09 LAB — COMPREHENSIVE METABOLIC PANEL
ALBUMIN: 3.9 g/dL (ref 3.5–5.0)
ALT: 20 U/L (ref 0–44)
ANION GAP: 10 (ref 5–15)
AST: 25 U/L (ref 15–41)
Alkaline Phosphatase: 81 U/L (ref 38–126)
BILIRUBIN TOTAL: 0.6 mg/dL (ref 0.3–1.2)
BUN: 24 mg/dL — ABNORMAL HIGH (ref 8–23)
CHLORIDE: 104 mmol/L (ref 98–111)
CO2: 25 mmol/L (ref 22–32)
Calcium: 8.9 mg/dL (ref 8.9–10.3)
Creatinine, Ser: 1.45 mg/dL — ABNORMAL HIGH (ref 0.61–1.24)
GFR calc Af Amer: 53 mL/min — ABNORMAL LOW (ref >60)
GFR calc non Af Amer: 46 mL/min — ABNORMAL LOW (ref >60)
GLUCOSE: 122 mg/dL — AB (ref 70–99)
POTASSIUM: 4.3 mmol/L (ref 3.5–5.1)
SODIUM: 139 mmol/L (ref 135–145)
TOTAL PROTEIN: 8.8 g/dL — AB (ref 6.5–8.1)

## 2018-11-09 LAB — LACTATE DEHYDROGENASE: LDH: 159 U/L (ref 98–192)

## 2018-11-09 LAB — FERRITIN: Ferritin: 100 ng/mL (ref 24–336)

## 2018-11-09 MED ORDER — IOPAMIDOL (ISOVUE-300) INJECTION 61%
100.0000 mL | Freq: Once | INTRAVENOUS | Status: AC | PRN
  Administered 2018-11-09: 100 mL via INTRAVENOUS

## 2018-11-12 LAB — PROTEIN ELECTROPHORESIS, SERUM
A/G RATIO SPE: 0.7 (ref 0.7–1.7)
ALBUMIN ELP: 3.5 g/dL (ref 2.9–4.4)
ALPHA-2-GLOBULIN: 1.2 g/dL — AB (ref 0.4–1.0)
Alpha-1-Globulin: 0.3 g/dL (ref 0.0–0.4)
Beta Globulin: 1.6 g/dL — ABNORMAL HIGH (ref 0.7–1.3)
GLOBULIN, TOTAL: 4.7 g/dL — AB (ref 2.2–3.9)
Gamma Globulin: 1.6 g/dL (ref 0.4–1.8)
Total Protein ELP: 8.2 g/dL (ref 6.0–8.5)

## 2018-11-13 ENCOUNTER — Other Ambulatory Visit: Payer: Self-pay

## 2018-11-13 ENCOUNTER — Inpatient Hospital Stay (HOSPITAL_BASED_OUTPATIENT_CLINIC_OR_DEPARTMENT_OTHER): Payer: Medicare Other | Admitting: Internal Medicine

## 2018-11-13 VITALS — BP 136/86 | HR 103 | Temp 97.8°F | Resp 18 | Wt 206.5 lb

## 2018-11-13 DIAGNOSIS — I10 Essential (primary) hypertension: Secondary | ICD-10-CM

## 2018-11-13 DIAGNOSIS — C649 Malignant neoplasm of unspecified kidney, except renal pelvis: Secondary | ICD-10-CM

## 2018-11-13 DIAGNOSIS — Z8546 Personal history of malignant neoplasm of prostate: Secondary | ICD-10-CM

## 2018-11-13 DIAGNOSIS — Z7984 Long term (current) use of oral hypoglycemic drugs: Secondary | ICD-10-CM

## 2018-11-13 DIAGNOSIS — E119 Type 2 diabetes mellitus without complications: Secondary | ICD-10-CM

## 2018-11-13 DIAGNOSIS — C642 Malignant neoplasm of left kidney, except renal pelvis: Secondary | ICD-10-CM | POA: Diagnosis not present

## 2018-11-13 DIAGNOSIS — Z79899 Other long term (current) drug therapy: Secondary | ICD-10-CM

## 2018-11-13 DIAGNOSIS — D869 Sarcoidosis, unspecified: Secondary | ICD-10-CM | POA: Diagnosis not present

## 2018-11-13 DIAGNOSIS — Z7982 Long term (current) use of aspirin: Secondary | ICD-10-CM

## 2018-11-13 DIAGNOSIS — Z905 Acquired absence of kidney: Secondary | ICD-10-CM

## 2018-11-13 DIAGNOSIS — C61 Malignant neoplasm of prostate: Secondary | ICD-10-CM

## 2018-11-13 NOTE — Progress Notes (Signed)
Diagnosis Chromophobe renal cell carcinoma (HCC) - Plan: CBC with Differential/Platelet, Comprehensive metabolic panel, Lactate dehydrogenase, CT CHEST W CONTRAST, CT ABDOMEN PELVIS W CONTRAST  Staging Cancer Staging Chromophobe renal cell carcinoma (HCC) Staging form: Kidney, AJCC 8th Edition - Clinical: Stage III (cT3a, cNX, cM0) - Signed by Twana First, MD on 02/13/2017 - Pathologic: No stage assigned - Unsigned   Assessment and Plan:  1.  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  Surgical path demonstrated Stage III (pT3a, pNx) chromophobe renal cell carcinoma.  He continues to follow-up with urology.    CT CAP done 11/09/2018 reviewed and showed  IMPRESSION: 1. Mildly prominent lymph nodes within the lower chest, upper abdomen and retroperitoneum, stable compared to previous exams, most likely benign/reactive as suggested on earlier CT reports. No new or enlarged lymph nodes appreciated within the chest, abdomen or pelvis. 2. Again no evidence of active or progressive malignancy within the chest, abdomen or pelvis. No evidence of recurrence at the LEFT nephrectomy site. 3. Multiple RIGHT renal lesions are stable, as described above, likely complex/hemorrhagic cysts and chronic involuted angiomyolipoma as previously suggested. 4. Brachy therapy seeds in place. Size and configuration of the prostate gland is stable. 5. Colonic diverticulosis without evidence of acute diverticulitis. 6. Cholelithiasis without evidence of acute cholecystitis. 7. Cirrhotic appearing liver. 8.  Aortic Atherosclerosis (ICD10-I70.0). 9. Additional chronic/incidental findings detailed above.  Lab done 11/09/2018 reviewed and showed WBC 5.6 HB 10.6 plts 262,000.  Chemistries WNL with K+ 4.3 Cr 1.45 and normal LFTs.  Pt will be set up for CT CAP in 04/2019 for ongoing follow-up of renal lesions.  He will be seen at that time to go over labs and scans.     2. High Gleason grade prostate cancer.  He continues on ADT with lupron which is managed by Dr. Alyson Ingles. He was seen by Dr. Tammi Klippel for RT in 10/2017.  Follow-up with RT and urology as recommended.    3.  Right renal mass.  CT abdomen and pelvis done 11/09/2018 shows multiple renal lesions are stable and most likely represent complex cysts and an involuted angiomyolipoma.  He should continue to follow-up with Urology.   4.  Liver changes.  This was noted on CT.  Pt denies any history of cirrhosis.  Hepatitis labs done 08/30/2018 are negative.  LFTs remain WNL on labs done 11/09/2018.  PT should follow-up with GI as recommended.  He will have repeat imaging in 04/2019.    5.  Sarcoidosis.  Follow-up with pulmonary as recommended.  CT of chest showed lung scarring.  Pt reports minor cough.  OTC cough meds recommended.    6.  DM.  Follow-up with PCP.    7.  RI.  Cr 1.45.  Repeat labs on RTC in 04/2019.    25 minutes spent with more than 50% spent in counseling and coordination of care.    Interval History:  Historical data obtained from note dated 05/14/2018.  77 yr old male previously followed by Dr. Talbert Cage and was  found to have an enlarged prostate and a biopsy was done. Biopsy revealed high Gleason grade carcinoma of prostate. Patient was referred to Dr Tammi Klippel. Mr. Swanger During that evaluation a CT scan revealed left kidney mass with the adjoining nodal mass.  There are multiple other small lymph nodes. He underwent left open radical nephrectomy with Dr. Alyson Ingles at the end of January 2018. Surgical path demonstrated Stage III (pT3a, pNx) chromophobe renal  cell carcinoma.  He continues to follow-up with urology.    Current Status:  Pt is seen today for follow-up.  He is here to go over labs and scans.   -   Chromophobe renal cell carcinoma ([place])   02/13/2017 Initial Diagnosis    Chromophobe renal cell carcinoma ([place])    05/15/2017 Imaging    IMPRESSION: 1. Status post left nephrectomy. No  findings to suggest local recurrence of disease and no definite metastatic disease in the chest, abdomen or pelvis. 2. Densely calcified exophytic mass in the lower pole of the right kidney and 2 cm indeterminate lesion in the anterior aspect of the upper pole of the right kidney are both similar to the prior study. These are favored to be benign, but close attention on followup studies is recommended to ensure stability.      Problem List Patient Active Problem List   Diagnosis Date Noted  . Dizziness [R42] 08/21/2017  . Chromophobe renal cell carcinoma ([place]) [C64.9] 02/13/2017  . Left renal mass [N28.89] 01/20/2017  . Mass of left kidney [N28.89] 12/22/2016  . Prostate cancer ([place]) [C61] 12/22/2016  . Acute sinusitis [J01.90] 06/21/2015  . Essential hypertension, benign [I10] 06/21/2015  . Hyperlipidemia associated with type 2 diabetes mellitus ([place]) [E11.69, E78.5] 06/21/2015  . Type II diabetes mellitus, uncontrolled ([place]) [E11.65] 06/21/2015  . Brain TIA [G45.9] 06/19/2015  . Rectal bleeding [K62.5] 04/29/2013    Past Medical History Past Medical History:  Diagnosis Date  . Anemia   . Diverticulosis of colon   . Frequency of urination   . Glaucoma, both eyes   . Heart murmur   . History of adenomatous polyp of colon 05/16/2013  . History of transient ischemic attack (TIA) 06/19/2015   no residual  . Hot flashes   . Hyperlipidemia   . Nocturia   . Prostate cancer [facility]) urologist-  dr Alyson Ingles  oncologist -- dr Tammi Klippel   dx 11-08-20147 via bx--- Gleason 4+5,  PSA 14.1, vol 52cc/  08-16-2017 last PSA  1.3 (per dr Alyson Ingles office note)    . Renal cell carcinoma, left [facility]) oncologist-  dr Barbaraann Share zhou ([facility])/  urologist-  dr Alyson Ingles   Stage III (pT3a, pN0)  Chromophobe RCC into perinephric fat /  01-20-2017 s/p  open radical left nephrecotmy/- per [facility] oncologist note -- no evidence of disease  . Sarcoidosis    per pt year 1980--- last Chest CT 05-15-2017   . Type 2 diabetes mellitus ([place])   . Wears partial dentures    upper and lower    Past Surgical History Past Surgical History:  Procedure Laterality Date  . COLONOSCOPY N/A 05/16/2013   Procedure: COLONOSCOPY;  Surgeon: Daneil Dolin, MD;  [facility];  Service: Endoscopy;  Laterality: N/A;  9:30  . cyst on left shoulder  yrs ago  . CYSTOSCOPY N/A 10/10/2017   Procedure: CYSTOSCOPY FLEXIBLE;  Surgeon: Cleon Gustin, MD;  Location: [facility];  Service: Urology;  Laterality: N/A;  2 seeds removed from bladder per Dr Alyson Ingles  . GOLD SEED IMPLANT N/A 10/10/2017   Procedure: GOLD SEED IMPLANT;  Surgeon: Cleon Gustin, MD;  Location: [facility];  Service: Urology;  Laterality: N/A;  3 gold seeds implanted  . MOLE REMOVAL     lower back by dr Romona Curls  . NEPHRECTOMY Left 01/20/2017   Procedure: OPEN NEPHRECTOMY;  Surgeon: Cleon Gustin, MD;  Location: [facility];  Service: Urology;  Laterality: Left;  . RADIOACTIVE SEED IMPLANT N/A 10/10/2017   Procedure: RADIOACTIVE SEED IMPLANT/BRACHYTHERAPY IMPLANT;  Surgeon: Cleon Gustin, MD;  Location[facility];  Service: Urology;  Laterality: N/A;  57 seeds implanted  . SPACE OAR INSTILLATION N/A 10/10/2017   Procedure: SPACE OAR INSTILLATION;  Surgeon: Cleon Gustin, MD;  Location: [facility];  Service: Urology;  Laterality: N/A;  . TRANSTHORACIC ECHOCARDIOGRAM  06/20/2015   mild to moderate LVH, ef 60-65%/  trivial MR    Family History Family History  Problem Relation Age of Onset  . Liver disease Father        NASH? age 36  . Diabetes Father   . Colon cancer Neg Hx      Social History  reports that he has never smoked. He has never used smokeless tobacco. He reports that he does not drink alcohol or use drugs.  Medications  Current Outpatient Medications:  .  aspirin EC 81 MG tablet, Take 81 mg by mouth every evening., Disp: , Rfl:   .  atorvastatin (LIPITOR) 80 MG tablet, Take 1 tablet (80 mg total) by mouth daily at 6 PM. (Patient taking differently: Take 80 mg by mouth every evening. ), Disp: 30 tablet, Rfl: 12 .  AZOPT 1 % ophthalmic suspension, Place 1 drop into both eyes 2 (two) times daily. , Disp: , Rfl:  .  HYDROcodone-acetaminophen (NORCO) 5-325 MG tablet, Take 1 tablet by mouth every 6 (six) hours as needed for moderate pain., Disp: 30 tablet, Rfl: 0 .  Leuprolide Acetate (LUPRON IJ), Inject as directed every 3 (three) months., Disp: , Rfl:  .  lisinopril (PRINIVIL,ZESTRIL) 2.5 MG tablet, Take 1 tablet (2.5 mg total) by mouth daily. (Patient taking differently: Take 2.5 mg by mouth every evening. ), Disp: 30 tablet, Rfl: 12 .  metFORMIN (GLUCOPHAGE) 500 MG tablet, Take 1 tablet (500 mg total) by mouth 2 (two) times daily with a meal. (Patient taking differently: Take 500 mg by mouth every evening. ), Disp: 60 tablet, Rfl: 5 .  TRAVATAN Z 0.004 % SOLN ophthalmic solution, Place 1 drop into both eyes at bedtime. , Disp: , Rfl:  No current facility-administered medications for this visit.   Facility-Administered Medications Ordered in Other Visits:  .  magnesium citrate solution 1 Bottle, 1 Bottle, Oral, Once, McKenzie, Candee Furbish, MD  Allergies Patient has no known allergies.  Review of Systems Review of Systems - Oncology ROS negative other than minor cough.     Physical Exam  Vitals Wt Readings from Last 3 Encounters:  11/13/18 206 lb 8 oz (93.7 kg)  09/06/18 206 lb 14.4 oz (93.8 kg)  05/14/18 205 lb 14.4 oz (93.4 kg)   Temp Readings from Last 3 Encounters:  11/13/18 97.8 F (36.6 C) (Oral)  09/06/18 98.3 F (36.8 C) (Oral)  05/14/18 98.6 F (37 C) (Oral)   BP Readings from Last 3 Encounters:  11/13/18 136/86  09/06/18 (!) 155/75  05/14/18 133/81   Pulse Readings from Last 3 Encounters:  11/13/18 (!) 103  09/06/18 81  05/14/18 80   Constitutional: Well-developed, well-nourished, and in no  distress.   HENT: Head: Normocephalic and atraumatic.  Mouth/Throat: No oropharyngeal exudate. Mucosa moist. Eyes: Pupils are equal, round, and reactive to light. Conjunctivae are normal. No scleral icterus.  Neck: Normal range of motion. Neck supple. No JVD present.  Cardiovascular: Normal rate, regular rhythm and normal heart sounds.  Exam reveals no gallop and no friction rub.  No murmur heard. Pulmonary/Chest: Effort normal and breath sounds normal. No respiratory distress. No wheezes.No rales.  Abdominal: Soft. Bowel sounds are normal. No distension. There is no tenderness. There is no guarding.  Musculoskeletal: No edema or tenderness.  Lymphadenopathy: No cervical, axillary or supraclavicular adenopathy.  Neurological: Alert and oriented to person, place, and time. No cranial nerve deficit.  Skin: Skin is warm and dry. No rash noted. No erythema. No pallor.  Psychiatric: Affect and judgment normal.   Labs No visits with results within 3 Day(s) from this visit.  Latest known visit with results is:  Appointment on 11/09/2018  Component Date Value Ref Range Status  . WBC 11/09/2018 5.6  4.0 - 10.5 K/uL Final  . RBC 11/09/2018 4.08* 4.22 - 5.81 MIL/uL Final  . Hemoglobin 11/09/2018 10.6* 13.0 - 17.0 g/dL Final  . HCT 11/09/2018 35.7* 39.0 - 52.0 % Final  . MCV 11/09/2018 87.5  80.0 - 100.0 fL Final  . MCH 11/09/2018 26.0  26.0 - 34.0 pg Final  . MCHC 11/09/2018 29.7* 30.0 - 36.0 g/dL Final  . RDW 11/09/2018 16.8* 11.5 - 15.5 % Final  . Platelets 11/09/2018 262  150 - 400 K/uL Final  . nRBC 11/09/2018 0.0  0.0 - 0.2 % Final  . Neutrophils Relative % 11/09/2018 65  % Final  . Neutro Abs 11/09/2018 3.7  1.7 - 7.7 K/uL Final  . Lymphocytes Relative 11/09/2018 16  % Final  . Lymphs Abs 11/09/2018 0.9  0.7 - 4.0 K/uL Final  . Monocytes Relative 11/09/2018 14  % Final  . Monocytes Absolute 11/09/2018 0.8  0.1 - 1.0 K/uL Final  . Eosinophils Relative 11/09/2018 4  % Final  .  Eosinophils Absolute 11/09/2018 0.3  0.0 - 0.5 K/uL Final  . Basophils Relative 11/09/2018 1  % Final  . Basophils Absolute 11/09/2018 0.0  0.0 - 0.1 K/uL Final  . Immature Granulocytes 11/09/2018 0  % Final  . Abs Immature Granulocytes 11/09/2018 0.01  0.00 - 0.07 K/uL Final   Performed at [facility], [address], [place], [place] [address]  . Sodium 11/09/2018 139  135 - 145 mmol/L Final  . Potassium 11/09/2018 4.3  3.5 - 5.1 mmol/L Final  . Chloride 11/09/2018 104  98 - 111 mmol/L Final  . CO2 11/09/2018 25  22 - 32 mmol/L Final  . Glucose, Bld 11/09/2018 122* 70 - 99 mg/dL Final  . BUN 11/09/2018 24* 8 - 23 mg/dL Final  . Creatinine, Ser 11/09/2018 1.45* 0.61 - 1.24 mg/dL Final  . Calcium 11/09/2018 8.9  8.9 - 10.3 mg/dL Final  . Total Protein 11/09/2018 8.8* 6.5 - 8.1 g/dL Final  . Albumin 11/09/2018 3.9  3.5 - 5.0 g/dL Final  . AST 11/09/2018 25  15 - 41 U/L Final  . ALT 11/09/2018 20  0 - 44 U/L Final  . Alkaline Phosphatase 11/09/2018 81  38 - 126 U/L Final  . Total Bilirubin 11/09/2018 0.6  0.3 - 1.2 mg/dL Final  . GFR calc non Af Amer 11/09/2018 46* >60 mL/min Final  . GFR calc Af Amer 11/09/2018 53* >60 mL/min Final   Comment: (NOTE) The eGFR has been calculated using the CKD EPI equation. This calculation has not been validated in all clinical situations. eGFR's persistently <60 mL/min signify possible Chronic Kidney Disease.   Georgiann Hahn gap 11/09/2018 10  5 - 15 Final   Performed at [facility], [address] [place], [place] [address]  . LDH 11/09/2018 159  98 - 192 U/L Final   Performed at [facility], [address] [place], [place] [address]  . Ferritin 11/09/2018 100  24 - 336 ng/mL Final   Performed at [facility], [address] [place], [place] [address]  . Total Protein ELP 11/09/2018 8.2  6.0 - 8.5 g/dL Final  . Albumin ELP 11/09/2018 3.5  2.9 - 4.4 g/dL Final  . Alpha-1-Globulin 11/09/2018 0.3  0.0 - 0.4 g/dL Final  . Alpha-2-Globulin 11/09/2018 1.2*  0.4 - 1.0 g/dL Final  . Beta Globulin 11/09/2018 1.6* 0.7 - 1.3 g/dL Final  . Gamma Globulin 11/09/2018 1.6  0.4 - 1.8 g/dL Final  . M-Spike, % 11/09/2018 Not Observed  Not Observed g/dL Final  . SPE Interp. 11/09/2018 Comment   Final   Comment: (NOTE) The SPE pattern is suggestive of a subacute inflammatory response. This condition represents an intermediate stage between two possible courses for acute inflammation: total convalescense with a return to normal, or the onset of a chronic inflammatory condition. Performed At: [facility] [place], [place] [id number] Rush Farmer MD [id number]   . Comment 11/09/2018 Comment   Final   Comment: (NOTE) Protein electrophoresis scan will follow via computer, mail, or courier delivery.   [name] GLOBULIN, TOTAL 11/09/2018 4.7* 2.2 - 3.9 g/dL Corrected  . A/G Ratio 11/09/2018 0.7  0.7 - 1.7 Corrected     Pathology Orders Placed This Encounter  Procedures  . CT CHEST W CONTRAST    Standing Status:   Future    Standing Expiration Date:   11/13/2019    Order Specific Question:   If indicated for the ordered procedure, I authorize the administration of contrast media per Radiology protocol    Answer:   Yes    Order Specific Question:   Preferred imaging location?    Answer:   [facility]    Order Specific Question:   Radiology Contrast Protocol - do NOT remove file path    Answer:   [email or web address]  . CT ABDOMEN PELVIS W CONTRAST    Standing Status:   Future    Standing Expiration Date:   11/13/2019    Order Specific Question:   If indicated for the ordered procedure, I authorize the administration of contrast media per Radiology protocol    Answer:   Yes    Order Specific Question:   Preferred imaging location?    Answer:   [facility]    Order Specific Question:   Is Oral Contrast requested for this exam?    Answer:   Yes, Per Radiology protocol    Order Specific  Question:   Radiology Contrast Protocol - do NOT remove file path    Answer:   [email or web address]  . CBC with Differential/Platelet    Standing Status:   Future    Standing Expiration Date:   11/14/2019  . Comprehensive metabolic panel    Standing Status:   Future    Standing Expiration Date:   11/14/2019  . Lactate dehydrogenase    Standing Status:   Future    Standing Expiration Date:   11/14/2019       Zoila Shutter MD

## 2018-11-13 NOTE — Patient Instructions (Signed)
[facility]  You saw Dr. Vetta Higgs today _______________________________________________________________  Thank you for choosing [facility] to provide your oncology and hematology care.  To afford each patient quality time with our providers, please arrive at least 15 minutes before your scheduled appointment.  You need to re-schedule your appointment if you arrive 10 or more minutes late.  We strive to give you quality time with our providers, and arriving late affects you and other patients whose appointments are after yours.  Also, if you no show three or more times for appointments you may be dismissed from the clinic.  Again, thank you for choosing [facility]. Our hope is that these requests will allow you access to exceptional care and in a timely manner. _______________________________________________________________  If you have questions after your visit, please contact our office at [phone] between the hours of 8:30 a.m. and 5:00 p.m. Voicemails left after 4:30 p.m. will not be returned until the following business day. _______________________________________________________________  For prescription refill requests, have your pharmacy contact our office. _______________________________________________________________  Recommendations made by the consultant and any test results will be sent to your referring physician. _______________________________________________________________ 

## 2018-12-05 ENCOUNTER — Ambulatory Visit (INDEPENDENT_AMBULATORY_CARE_PROVIDER_SITE_OTHER): Payer: Medicare Other | Admitting: Urology

## 2018-12-05 DIAGNOSIS — C642 Malignant neoplasm of left kidney, except renal pelvis: Secondary | ICD-10-CM

## 2018-12-05 DIAGNOSIS — C61 Malignant neoplasm of prostate: Secondary | ICD-10-CM | POA: Diagnosis not present

## 2019-05-13 ENCOUNTER — Other Ambulatory Visit (HOSPITAL_COMMUNITY): Payer: Self-pay | Admitting: *Deleted

## 2019-05-13 DIAGNOSIS — C649 Malignant neoplasm of unspecified kidney, except renal pelvis: Secondary | ICD-10-CM

## 2019-05-13 DIAGNOSIS — D508 Other iron deficiency anemias: Secondary | ICD-10-CM

## 2019-05-13 DIAGNOSIS — C61 Malignant neoplasm of prostate: Secondary | ICD-10-CM

## 2019-05-14 ENCOUNTER — Other Ambulatory Visit: Payer: Self-pay

## 2019-05-14 ENCOUNTER — Inpatient Hospital Stay (HOSPITAL_COMMUNITY): Payer: Medicare Other | Attending: Hematology

## 2019-05-14 ENCOUNTER — Ambulatory Visit (HOSPITAL_COMMUNITY)
Admission: RE | Admit: 2019-05-14 | Discharge: 2019-05-14 | Disposition: A | Discharge status: Home / Self Care | Payer: Medicare Other | Source: Ambulatory Visit | Attending: Internal Medicine | Admitting: Internal Medicine

## 2019-05-14 DIAGNOSIS — C649 Malignant neoplasm of unspecified kidney, except renal pelvis: Secondary | ICD-10-CM

## 2019-05-14 DIAGNOSIS — Z79818 Long term (current) use of other agents affecting estrogen receptors and estrogen levels: Secondary | ICD-10-CM | POA: Insufficient documentation

## 2019-05-14 DIAGNOSIS — I2510 Atherosclerotic heart disease of native coronary artery without angina pectoris: Secondary | ICD-10-CM | POA: Diagnosis not present

## 2019-05-14 DIAGNOSIS — I700 Atherosclerosis of aorta: Secondary | ICD-10-CM | POA: Insufficient documentation

## 2019-05-14 DIAGNOSIS — K8020 Calculus of gallbladder without cholecystitis without obstruction: Secondary | ICD-10-CM | POA: Diagnosis not present

## 2019-05-14 DIAGNOSIS — Z7984 Long term (current) use of oral hypoglycemic drugs: Secondary | ICD-10-CM | POA: Insufficient documentation

## 2019-05-14 DIAGNOSIS — N289 Disorder of kidney and ureter, unspecified: Secondary | ICD-10-CM | POA: Diagnosis not present

## 2019-05-14 DIAGNOSIS — K7460 Unspecified cirrhosis of liver: Secondary | ICD-10-CM | POA: Insufficient documentation

## 2019-05-14 DIAGNOSIS — K458 Other specified abdominal hernia without obstruction or gangrene: Secondary | ICD-10-CM | POA: Insufficient documentation

## 2019-05-14 DIAGNOSIS — C61 Malignant neoplasm of prostate: Secondary | ICD-10-CM | POA: Diagnosis not present

## 2019-05-14 DIAGNOSIS — Z905 Acquired absence of kidney: Secondary | ICD-10-CM | POA: Diagnosis not present

## 2019-05-14 DIAGNOSIS — K5730 Diverticulosis of large intestine without perforation or abscess without bleeding: Secondary | ICD-10-CM | POA: Insufficient documentation

## 2019-05-14 DIAGNOSIS — D508 Other iron deficiency anemias: Secondary | ICD-10-CM

## 2019-05-14 DIAGNOSIS — C642 Malignant neoplasm of left kidney, except renal pelvis: Secondary | ICD-10-CM | POA: Insufficient documentation

## 2019-05-14 DIAGNOSIS — Z7982 Long term (current) use of aspirin: Secondary | ICD-10-CM | POA: Insufficient documentation

## 2019-05-14 DIAGNOSIS — Z79899 Other long term (current) drug therapy: Secondary | ICD-10-CM | POA: Insufficient documentation

## 2019-05-14 DIAGNOSIS — Z923 Personal history of irradiation: Secondary | ICD-10-CM | POA: Diagnosis not present

## 2019-05-14 DIAGNOSIS — R05 Cough: Secondary | ICD-10-CM | POA: Diagnosis not present

## 2019-05-14 LAB — CBC WITH DIFFERENTIAL/PLATELET
Abs Immature Granulocytes: 0.01 10*3/uL (ref 0.00–0.07)
Basophils Absolute: 0 10*3/uL (ref 0.0–0.1)
Basophils Relative: 0 %
Eosinophils Absolute: 0.2 10*3/uL (ref 0.0–0.5)
Eosinophils Relative: 4 %
HCT: 35.9 % — ABNORMAL LOW (ref 39.0–52.0)
Hemoglobin: 11 g/dL — ABNORMAL LOW (ref 13.0–17.0)
Immature Granulocytes: 0 %
Lymphocytes Relative: 17 %
Lymphs Abs: 1 10*3/uL (ref 0.7–4.0)
MCH: 26.1 pg (ref 26.0–34.0)
MCHC: 30.6 g/dL (ref 30.0–36.0)
MCV: 85.1 fL (ref 80.0–100.0)
Monocytes Absolute: 0.8 10*3/uL (ref 0.1–1.0)
Monocytes Relative: 13 %
Neutro Abs: 3.8 10*3/uL (ref 1.7–7.7)
Neutrophils Relative %: 66 %
Platelets: 243 10*3/uL (ref 150–400)
RBC: 4.22 MIL/uL (ref 4.22–5.81)
RDW: 18.6 % — ABNORMAL HIGH (ref 11.5–15.5)
WBC: 5.8 10*3/uL (ref 4.0–10.5)
nRBC: 0 % (ref 0.0–0.2)

## 2019-05-14 LAB — LACTATE DEHYDROGENASE: LDH: 138 U/L (ref 98–192)

## 2019-05-14 LAB — COMPREHENSIVE METABOLIC PANEL
ALT: 19 U/L (ref 0–44)
AST: 26 U/L (ref 15–41)
Albumin: 3.8 g/dL (ref 3.5–5.0)
Alkaline Phosphatase: 77 U/L (ref 38–126)
Anion gap: 12 (ref 5–15)
BUN: 31 mg/dL — ABNORMAL HIGH (ref 8–23)
CO2: 22 mmol/L (ref 22–32)
Calcium: 9.2 mg/dL (ref 8.9–10.3)
Chloride: 108 mmol/L (ref 98–111)
Creatinine, Ser: 1.44 mg/dL — ABNORMAL HIGH (ref 0.61–1.24)
GFR calc Af Amer: 54 mL/min — ABNORMAL LOW (ref >60)
GFR calc non Af Amer: 47 mL/min — ABNORMAL LOW (ref >60)
Glucose, Bld: 120 mg/dL — ABNORMAL HIGH (ref 70–99)
Potassium: 5.1 mmol/L (ref 3.5–5.1)
Sodium: 142 mmol/L (ref 135–145)
Total Bilirubin: 0.6 mg/dL (ref 0.3–1.2)
Total Protein: 8.2 g/dL — ABNORMAL HIGH (ref 6.5–8.1)

## 2019-05-14 MED ORDER — IOHEXOL 300 MG/ML  SOLN
100.0000 mL | Freq: Once | INTRAMUSCULAR | Status: AC | PRN
  Administered 2019-05-14: 11:00:00 100 mL via INTRAVENOUS

## 2019-05-16 NOTE — Progress Notes (Signed)
For review.  Please update ordering provider

## 2019-05-21 ENCOUNTER — Other Ambulatory Visit: Payer: Self-pay

## 2019-05-21 ENCOUNTER — Encounter (HOSPITAL_COMMUNITY): Payer: Self-pay | Admitting: Hematology

## 2019-05-21 ENCOUNTER — Inpatient Hospital Stay (HOSPITAL_BASED_OUTPATIENT_CLINIC_OR_DEPARTMENT_OTHER): Payer: Medicare Other | Admitting: Hematology

## 2019-05-21 VITALS — BP 142/80 | HR 99 | Temp 99.2°F | Resp 18 | Wt 201.0 lb

## 2019-05-21 DIAGNOSIS — K7460 Unspecified cirrhosis of liver: Secondary | ICD-10-CM

## 2019-05-21 DIAGNOSIS — Z79899 Other long term (current) drug therapy: Secondary | ICD-10-CM

## 2019-05-21 DIAGNOSIS — Z905 Acquired absence of kidney: Secondary | ICD-10-CM

## 2019-05-21 DIAGNOSIS — Z7982 Long term (current) use of aspirin: Secondary | ICD-10-CM

## 2019-05-21 DIAGNOSIS — Z7984 Long term (current) use of oral hypoglycemic drugs: Secondary | ICD-10-CM

## 2019-05-21 DIAGNOSIS — C61 Malignant neoplasm of prostate: Secondary | ICD-10-CM | POA: Diagnosis not present

## 2019-05-21 DIAGNOSIS — R05 Cough: Secondary | ICD-10-CM

## 2019-05-21 DIAGNOSIS — N2889 Other specified disorders of kidney and ureter: Secondary | ICD-10-CM

## 2019-05-21 DIAGNOSIS — C642 Malignant neoplasm of left kidney, except renal pelvis: Secondary | ICD-10-CM | POA: Diagnosis not present

## 2019-05-21 DIAGNOSIS — Z923 Personal history of irradiation: Secondary | ICD-10-CM | POA: Diagnosis not present

## 2019-05-21 DIAGNOSIS — Z79818 Long term (current) use of other agents affecting estrogen receptors and estrogen levels: Secondary | ICD-10-CM

## 2019-05-21 DIAGNOSIS — C649 Malignant neoplasm of unspecified kidney, except renal pelvis: Secondary | ICD-10-CM

## 2019-05-21 NOTE — Progress Notes (Signed)
Patient Care Team: Sinda Du, MD as PCP - General (Internal Medicine) Gala Romney Cristopher Estimable, MD as Attending Physician (Gastroenterology)  DIAGNOSIS:  Encounter Diagnoses  Name Primary?  . Right renal mass Yes  . 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 Status post left nephrectomy. No findings to suggest local recurrence of disease and no definite metastatic disease in the chest, abdomen or pelvis. 2. Densely calcified exophytic mass in the lower pole of the right kidney and 2 cm indeterminate lesion in the anterior aspect of the upper pole of the right kidney are both similar to the prior study. These are favored to be benign, but close attention on followup studies is recommended to ensure stability.     CHIEF COMPLIANT: Follow-up for kidney cancer.  INTERVAL HISTORY: Jon Brooks. is a very pleasant 78 year old African-American male who is seen in follow-up for kidney cancer and prostate cancer.  He had a left nephrectomy done in January 2018.  He denies any new onset pains.  Denies any hematuria or recurrent infections.  Denies any fevers, night sweats or weight loss in the last 6 months.  Denies any ER visits or hospitalizations or active infections.  His appetite is 100%.  Energy levels are 75%.  He has mild cough which is stable.  REVIEW OF SYSTEMS:   Constitutional: Denies fevers, chills or abnormal weight loss Eyes: Denies blurriness of vision Ears, nose, mouth, throat, and face: Denies mucositis or sore throat Respiratory: Denies cough, dyspnea or wheezes Cardiovascular: Denies palpitation, chest discomfort Gastrointestinal:  Denies nausea, heartburn or change in bowel habits Skin: Denies abnormal skin rashes Lymphatics: Denies new lymphadenopathy or easy bruising  Neurological:Denies numbness, tingling or new weaknesses Behavioral/Psych: Mood is stable, no new changes  Extremities: No lower extremity edema All other systems were reviewed with the patient and are negative.  I have reviewed the past medical history, past surgical history, social history and family history with the patient and they are unchanged from previous note.  ALLERGIES:  has No Known Allergies.  MEDICATIONS:  Current Outpatient Medications  Medication Sig Dispense Refill  . aspirin EC 81 MG tablet Take 81 mg by mouth every evening.    [name] atorvastatin (LIPITOR) 80 MG tablet Take 1 tablet (80 mg total) by mouth daily at 6 PM. (Patient taking differently: Take 80 mg by mouth every evening. ) 30 tablet 12  . AZOPT 1 % ophthalmic suspension Place 1 drop into both eyes 2 (two) times daily.     [name] Leuprolide Acetate (LUPRON IJ) Inject as directed every 3 (three) months.    [name] lisinopril (PRINIVIL,ZESTRIL) 2.5 MG tablet Take 1 tablet (2.5 mg total) by mouth daily. (Patient taking differently: Take 2.5 mg by mouth every evening. ) 30 tablet 12  . metFORMIN (GLUCOPHAGE) 500 MG tablet Take 1 tablet (500 mg total) by mouth 2 (two) times daily with a meal. (Patient taking differently: Take 500 mg by mouth every evening. ) 60 tablet 5   No current facility-administered medications for this visit.    Facility-Administered Medications Ordered in Other Visits  Medication Dose Route Frequency Provider Last Rate Last Dose  . magnesium citrate solution 1 Bottle  1 Bottle Oral Once McKenzie, Candee Furbish, MD        PHYSICAL EXAMINATION: ECOG PERFORMANCE  STATUS: 1 - Symptomatic but completely ambulatory  Vitals:   05/21/19 0800  BP: (!) 142/80  Pulse: 99  Resp: 18  Temp: 99.2 F (37.3 C)  SpO2: 96%   Filed Weights   05/21/19 0800  Weight: 201 lb (91.2 kg)    GENERAL:alert, no distress and comfortable SKIN: skin color, texture, turgor are normal, no rashes or significant lesions EYES:  normal, Conjunctiva are pink and non-injected, sclera clear OROPHARYNX:no mucositis, no erythema and lips, buccal mucosa, and tongue normal  NECK: supple, thyroid normal size, non-tender, without nodularity LYMPH:  no palpable lymphadenopathy in the cervical, axillary or inguinal LUNGS: clear to auscultation and percussion with normal breathing effort HEART: regular rate & rhythm and no murmurs and no lower extremity edema ABDOMEN:abdomen soft, non-tender and normal bowel sounds MUSCULOSKELETAL:no cyanosis of digits and no clubbing   EXTREMITIES: No lower extremity edema   LABORATORY DATA:  I have reviewed the data as listed CMP Latest Ref Rng & Units 05/14/2019 11/09/2018 08/30/2018  Glucose 70 - 99 mg/dL 120(H) 122(H) 176(H)  BUN 8 - 23 mg/dL 31(H) 24(H) 26(H)  Creatinine 0.61 - 1.24 mg/dL 1.44(H) 1.45(H) 1.51(H)  Sodium 135 - 145 mmol/L 142 139 140  Potassium 3.5 - 5.1 mmol/L 5.1 4.3 4.5  Chloride 98 - 111 mmol/L 108 104 105  CO2 22 - 32 mmol/L 22 25 25   Calcium 8.9 - 10.3 mg/dL 9.2 8.9 9.1  Total Protein 6.5 - 8.1 g/dL 8.2(H) 8.8(H) 8.0  Total Bilirubin 0.3 - 1.2 mg/dL 0.6 0.6 0.7  Alkaline Phos 38 - 126 U/L 77 81 71  AST 15 - 41 U/L 26 25 26   ALT 0 - 44 U/L 19 20 20    No results found for: TMA263   Lab Results  Component Value Date   WBC 5.8 05/14/2019   HGB 11.0 (L) 05/14/2019   HCT 35.9 (L) 05/14/2019   MCV 85.1 05/14/2019   PLT 243 05/14/2019   NEUTROABS 3.8 05/14/2019    ASSESSMENT & PLAN:  Chromophobe renal cell carcinoma (HCC) 1.  Chromophobe renal cell carcinoma: - Status post left nephrectomy on 01/20/2017, on observation. -Denies any bleeding per rectum or hematuria. - CT CAP on 05/14/2019 reviewed by me shows left nephrectomy bed did not show any recurrent disease or metastatic disease in the chest, abdomen or pelvis. -Slow interval enlargement of an intermediate attenuation lesion in the anterior aspect of the right kidney at the junction of the upper and  interpolar region.  Previously recognized large calcified mass in the lower pole has a fatty attenuation.  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 - I have recommended MRI of the abdomen with and without contrast to further delineate the kidney lesion. -We will see him back after the scan to discuss results.  2.  High-grade prostate cancer: - He had prostate cancer with Gleason 4+5, PSA 14.1. - He received IMRT and seed implants from 11/07/2017 through 12/14/2017. -He is also receiving concurrent Lupron every 6 months for the past year. -He follows up with Dr. Alyson Ingles at [facility] urology.    I spent 25 minutes talking to the patient of which more than half was spent in counseling and coordination of care.  Orders Placed This Encounter  Procedures  . MR Abdomen W Wo Contrast    Standing Status:   Future    Standing Expiration Date:   05/20/2020    Order Specific Question:   If indicated for the ordered procedure, I authorize the administration of contrast media per  Radiology protocol    Answer:   Yes    Order Specific Question:   What is the patient's sedation requirement?    Answer:   No Sedation    Order Specific Question:   Does the patient have a pacemaker or implanted devices?    Answer:   No    Order Specific Question:   Radiology Contrast Protocol - do NOT remove file path    Answer:   \\[email or web address]    Order Specific Question:   Preferred imaging location?    Answer:   [facility] (table limit-350lbs)   The patient has a good understanding of the overall plan. he agrees with it. he will call with any problems that may develop before the next visit here.   Derek Jack, MD 05/21/19

## 2019-05-21 NOTE — Assessment & Plan Note (Signed)
1.  Chromophobe renal cell carcinoma: - Status post left nephrectomy on 01/20/2017, on observation. -Denies any bleeding per rectum or hematuria. - CT CAP on 05/14/2019 reviewed by me shows left nephrectomy bed did not show any recurrent disease or metastatic disease in the chest, abdomen or pelvis. -Slow interval enlargement of an intermediate attenuation lesion in the anterior aspect of the right kidney at the junction of the upper and interpolar region.  Previously recognized large calcified mass in the lower pole has a fatty attenuation.  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 - I have recommended MRI of the abdomen with and without contrast to further delineate the kidney lesion. -We will see him back after the scan to discuss results.  2.  High-grade prostate cancer: - He had prostate cancer with Gleason 4+5, PSA 14.1. - He received IMRT and seed implants from 11/07/2017 through 12/14/2017. -He is also receiving concurrent Lupron every 6 months for the past year. -He follows up with Dr. Alyson Ingles at [facility] urology.

## 2019-06-12 ENCOUNTER — Ambulatory Visit (INDEPENDENT_AMBULATORY_CARE_PROVIDER_SITE_OTHER): Payer: Medicare Other | Admitting: Urology

## 2019-06-12 DIAGNOSIS — C642 Malignant neoplasm of left kidney, except renal pelvis: Secondary | ICD-10-CM | POA: Diagnosis not present

## 2019-06-12 DIAGNOSIS — C61 Malignant neoplasm of prostate: Secondary | ICD-10-CM | POA: Diagnosis not present

## 2019-06-24 ENCOUNTER — Other Ambulatory Visit: Payer: Self-pay

## 2019-06-24 ENCOUNTER — Ambulatory Visit (HOSPITAL_COMMUNITY)
Admission: RE | Admit: 2019-06-24 | Discharge: 2019-06-24 | Disposition: A | Discharge status: Home / Self Care | Payer: Medicare Other | Source: Ambulatory Visit | Attending: Hematology | Admitting: Hematology

## 2019-06-24 DIAGNOSIS — N2889 Other specified disorders of kidney and ureter: Secondary | ICD-10-CM | POA: Insufficient documentation

## 2019-06-24 MED ORDER — GADOBUTROL 1 MMOL/ML IV SOLN
8.0000 mL | Freq: Once | INTRAVENOUS | Status: AC | PRN
  Administered 2019-06-24: 8 mL via INTRAVENOUS

## 2019-06-26 ENCOUNTER — Inpatient Hospital Stay (HOSPITAL_COMMUNITY): Payer: Medicare Other | Attending: Hematology | Admitting: Hematology

## 2019-06-26 ENCOUNTER — Other Ambulatory Visit: Payer: Self-pay

## 2019-06-26 ENCOUNTER — Encounter (HOSPITAL_COMMUNITY): Payer: Self-pay | Admitting: Hematology

## 2019-06-26 VITALS — BP 145/66 | HR 70 | Temp 98.7°F | Resp 18 | Wt 199.6 lb

## 2019-06-26 DIAGNOSIS — Z923 Personal history of irradiation: Secondary | ICD-10-CM | POA: Diagnosis not present

## 2019-06-26 DIAGNOSIS — Z8546 Personal history of malignant neoplasm of prostate: Secondary | ICD-10-CM | POA: Diagnosis not present

## 2019-06-26 DIAGNOSIS — Z7984 Long term (current) use of oral hypoglycemic drugs: Secondary | ICD-10-CM | POA: Diagnosis not present

## 2019-06-26 DIAGNOSIS — C649 Malignant neoplasm of unspecified kidney, except renal pelvis: Secondary | ICD-10-CM

## 2019-06-26 DIAGNOSIS — Z905 Acquired absence of kidney: Secondary | ICD-10-CM | POA: Insufficient documentation

## 2019-06-26 DIAGNOSIS — N289 Disorder of kidney and ureter, unspecified: Secondary | ICD-10-CM

## 2019-06-26 DIAGNOSIS — C642 Malignant neoplasm of left kidney, except renal pelvis: Secondary | ICD-10-CM

## 2019-06-26 DIAGNOSIS — N281 Cyst of kidney, acquired: Secondary | ICD-10-CM

## 2019-06-26 DIAGNOSIS — Z79899 Other long term (current) drug therapy: Secondary | ICD-10-CM | POA: Insufficient documentation

## 2019-06-26 DIAGNOSIS — Z7982 Long term (current) use of aspirin: Secondary | ICD-10-CM | POA: Insufficient documentation

## 2019-06-26 NOTE — Patient Instructions (Addendum)
[facility] at [facility] Discharge Instructions  You were seen today by Dr. Katragadda. He went over your recent lab results. He will see you back in 6 months for labs and follow up.   Thank you for choosing [facility] at [facility] to provide your oncology and hematology care.  To afford each patient quality time with our provider, please arrive at least 15 minutes before your scheduled appointment time.   If you have a lab appointment with the Cancer Center please come in thru the  Main Entrance and check in at the main information desk  You need to re-schedule your appointment should you arrive 10 or more minutes late.  We strive to give you quality time with our providers, and arriving late affects you and other patients whose appointments are after yours.  Also, if you no show three or more times for appointments you may be dismissed from the clinic at the providers discretion.     Again, thank you for choosing [facility].  Our hope is that these requests will decrease the amount of time that you wait before being seen by our physicians.       _____________________________________________________________  Should you have questions after your visit to [facility], please contact our office at [phone] between the hours of 8:00 a.m. and 4:30 p.m.  Voicemails left after 4:00 p.m. will not be returned until the following business day.  For prescription refill requests, have your pharmacy contact our office and allow 72 hours.    Cancer Center Support Programs:   > Cancer Support Group  2nd Tuesday of the month 1pm-2pm, Journey Room    

## 2019-06-26 NOTE — Assessment & Plan Note (Signed)
1.  Chromophobe renal cell carcinoma: - Status post left nephrectomy on 01/20/2017, on observation. -Denies any bleeding per rectum or hematuria. - CT CAP on 05/14/2019 reviewed by me shows left nephrectomy bed did not show any recurrent disease or metastatic disease in the chest, abdomen or pelvis. -Slow interval enlargement of an intermediate attenuation lesion in the anterior aspect of the right kidney at the junction of the upper and interpolar region.  Previously recognized large calcified mass in the lower pole has a fatty attenuation.  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 - We discussed the results of the MRI of the abdomen with and without contrast dated 06/24/2019.  Intermediate density in the right mid upper kidney anteriorly represents a Bosniak category 2 benign but complex cyst and requires no further follow-up.  The other lesion with fatty and calcific/ossific elements in the right mid kidney is stable from 12/09/2016. -We will arrange him for follow-up CT CAP in 6 months.  2.  High-grade prostate cancer: - He had prostate cancer with Gleason 4+5, PSA 14.1. - He received IMRT and seed implants from 11/07/2017 through 12/14/2017. -He reportedly received Lupron every 6 months until January.  He no longer needs it per patient. -He follows up with Dr. Alyson Ingles at [facility] urology.

## 2019-06-26 NOTE — Progress Notes (Signed)
Patient Care Team: Sinda Du, MD as PCP - General (Internal Medicine) Gala Romney Cristopher Estimable, MD as Attending Physician (Gastroenterology)  DIAGNOSIS:  Encounter Diagnosis  Name Primary?  . 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 Status post left nephrectomy. No findings to suggest local recurrence of disease and no definite metastatic disease in the chest, abdomen or pelvis. 2. Densely calcified exophytic mass in the lower pole of the right kidney and 2 cm indeterminate lesion in the anterior aspect of the upper pole of the right kidney are both similar to the prior study. These are favored to be benign, but close attention on followup studies is recommended to ensure stability.     CHIEF COMPLIANT: Follow-up for kidney cancer.  INTERVAL HISTORY: Jon Brooks. seen for follow-up of MRI results.  He had abnormal CT scan which prompted [place] to order MRI of the abdomen.  Denies any hematuria or urinary changes.  Appetite is 75%.  Energy levels are 25%.  He was reportedly seen by Dr. Alyson Ingles and was told that he does not need Lupron anymore.  His energy levels are improving.  Last Lupron was in January.  Denies any fevers, night sweats or weight loss.  REVIEW OF SYSTEMS:   Constitutional: Denies fevers, chills or abnormal weight loss Eyes: Denies blurriness of vision Ears, nose, mouth, throat, and face: Denies mucositis or sore throat Respiratory: Denies cough, dyspnea or wheezes Cardiovascular: Denies palpitation, chest discomfort Gastrointestinal:  Denies nausea, heartburn or change in bowel habits Skin: Denies abnormal skin rashes Lymphatics: Denies new lymphadenopathy or easy bruising Neurological:Denies numbness, tingling or new weaknesses Behavioral/Psych: Mood is  stable, no new changes  Extremities: No lower extremity edema All other systems were reviewed with the patient and are negative.  I have reviewed the past medical history, past surgical history, social history and family history with the patient and they are unchanged from previous note.  ALLERGIES:  has No Known Allergies.  MEDICATIONS:  Current Outpatient Medications  Medication Sig Dispense Refill  . aspirin EC 81 MG tablet Take 81 mg by mouth every evening.    [name] atorvastatin (LIPITOR) 80 MG tablet Take 1 tablet (80 mg total) by mouth daily at 6 PM. (Patient taking differently: Take 80 mg by mouth every evening. ) 30 tablet 12  . AZOPT 1 % ophthalmic suspension Place 1 drop into both eyes 2 (two) times daily.     [name] Leuprolide Acetate (LUPRON IJ) Inject as directed every 3 (three) months.    [name] lisinopril (PRINIVIL,ZESTRIL) 2.5 MG tablet Take 1 tablet (2.5 mg total) by mouth daily. (Patient taking differently: Take 2.5 mg by mouth every evening. ) 30 tablet 12  . metFORMIN (GLUCOPHAGE) 500 MG tablet Take 1 tablet (500 mg total) by mouth 2 (two) times daily with a meal. (Patient taking differently: Take 500 mg by mouth every evening. ) 60 tablet 5   No current facility-administered medications for this visit.    Facility-Administered Medications Ordered in Other Visits  Medication Dose Route Frequency Provider Last Rate Last Dose  . magnesium citrate solution 1 Bottle  1 Bottle Oral Once Cleon Gustin, MD        PHYSICAL EXAMINATION: ECOG PERFORMANCE STATUS: 1 - Symptomatic but completely ambulatory  Vitals:   06/26/19 1051  BP: (!) 145/66  Pulse: 70  Resp: 18  Temp: 98.7 F (37.1 C)  SpO2: 100%   Filed Weights   06/26/19 1051  Weight: 199 lb 9.6 oz (90.5 kg)    GENERAL:alert, no distress and comfortable SKIN: skin color, texture, turgor are normal, no rashes or significant lesions EYES: normal, Conjunctiva are pink and non-injected, sclera clear OROPHARYNX:no  mucositis, no erythema and lips, buccal mucosa, and tongue normal  NECK: supple, thyroid normal size, non-tender, without nodularity LYMPH:  no palpable lymphadenopathy in the cervical, axillary or inguinal LUNGS: clear to auscultation and percussion with normal breathing effort HEART: regular rate & rhythm and no murmurs and no lower extremity edema ABDOMEN:abdomen soft, non-tender and normal bowel sounds MUSCULOSKELETAL:no cyanosis of digits and no clubbing   EXTREMITIES: No lower extremity edema   LABORATORY DATA:  I have reviewed the data as listed CMP Latest Ref Rng & Units 05/14/2019 11/09/2018 08/30/2018  Glucose 70 - 99 mg/dL 120(H) 122(H) 176(H)  BUN 8 - 23 mg/dL 31(H) 24(H) 26(H)  Creatinine 0.61 - 1.24 mg/dL 1.44(H) 1.45(H) 1.51(H)  Sodium 135 - 145 mmol/L 142 139 140  Potassium 3.5 - 5.1 mmol/L 5.1 4.3 4.5  Chloride 98 - 111 mmol/L 108 104 105  CO2 22 - 32 mmol/L 22 25 25   Calcium 8.9 - 10.3 mg/dL 9.2 8.9 9.1  Total Protein 6.5 - 8.1 g/dL 8.2(H) 8.8(H) 8.0  Total Bilirubin 0.3 - 1.2 mg/dL 0.6 0.6 0.7  Alkaline Phos 38 - 126 U/L 77 81 71  AST 15 - 41 U/L 26 25 26   ALT 0 - 44 U/L 19 20 20    No results found for: OAC166   Lab Results  Component Value Date   WBC 5.8 05/14/2019   HGB 11.0 (L) 05/14/2019   HCT 35.9 (L) 05/14/2019   MCV 85.1 05/14/2019   PLT 243 05/14/2019   NEUTROABS 3.8 05/14/2019    ASSESSMENT & PLAN:  Chromophobe renal cell carcinoma (HCC) 1.  Chromophobe renal cell carcinoma: - Status post left nephrectomy on 01/20/2017, on observation. -Denies any bleeding per rectum or hematuria. - CT CAP on 05/14/2019 reviewed by me shows left nephrectomy bed did not show any recurrent disease or metastatic disease in the chest, abdomen or pelvis. -Slow interval enlargement of an intermediate attenuation lesion in the anterior aspect of the right kidney at the junction of the upper and interpolar region.  Previously recognized large calcified mass in the lower  pole has a fatty attenuation.  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 - We discussed the results of the MRI of the abdomen with and without contrast dated 06/24/2019.  Intermediate density in the right mid upper kidney anteriorly represents a Bosniak category 2 benign but complex cyst and requires no further follow-up.  The other lesion with fatty and calcific/ossific elements in the right mid kidney is stable from 12/09/2016. -We will arrange him for follow-up CT CAP in 6 months.  2.  High-grade prostate cancer: - He had prostate cancer with Gleason 4+5, PSA 14.1. - He received IMRT and seed implants from 11/07/2017 through 12/14/2017. -He reportedly received Lupron every 6 months until January.  He no longer needs it per patient. -He follows up with Dr. Alyson Ingles at [facility] urology.   Orders Placed This Encounter  Procedures  . CT Abdomen Pelvis W Contrast    Standing Status:   Future    Standing Expiration Date:   06/25/2020    Order Specific Question:   If indicated for the ordered procedure, I authorize  the administration of contrast media per Radiology protocol    Answer:   Yes    Order Specific Question:   Preferred imaging location?    Answer:   [facility]    Order Specific Question:   Is Oral Contrast requested for this exam?    Answer:   Yes, Per Radiology protocol    Order Specific Question:   Radiology Contrast Protocol - do NOT remove file path    Answer:   \\[email or web address]  . CT Chest W Contrast    Standing Status:   Future    Standing Expiration Date:   06/25/2020    Order Specific Question:   If indicated for the ordered procedure, I authorize the administration of contrast media per Radiology protocol    Answer:   Yes    Order Specific Question:   Preferred imaging location?    Answer:   [facility]    Order Specific Question:   Radiology Contrast Protocol - do NOT remove file path    Answer:   \\[email or web address]   . CBC with Differential/Platelet    Standing Status:   Future    Standing Expiration Date:   06/25/2020  . Comprehensive metabolic panel    Standing Status:   Future    Standing Expiration Date:   06/25/2020   The patient has a good understanding of the overall plan. he agrees with it. he will call with any problems that may develop before the next visit here.   Derek Jack, MD 06/26/19

## 2019-12-30 ENCOUNTER — Ambulatory Visit (HOSPITAL_COMMUNITY): Admission: RE | Admit: 2019-12-30 | Payer: Medicare Other | Source: Ambulatory Visit

## 2019-12-30 ENCOUNTER — Inpatient Hospital Stay (HOSPITAL_COMMUNITY): Payer: Medicare Other

## 2020-01-02 ENCOUNTER — Ambulatory Visit (HOSPITAL_COMMUNITY): Payer: Medicare Other | Admitting: Hematology

## 2020-01-08 ENCOUNTER — Other Ambulatory Visit: Payer: Self-pay

## 2020-01-08 ENCOUNTER — Other Ambulatory Visit: Payer: Self-pay | Admitting: Urology

## 2020-01-08 DIAGNOSIS — C61 Malignant neoplasm of prostate: Secondary | ICD-10-CM

## 2020-01-08 LAB — PSA: PSA: <0.1 ng/mL (ref <=4.0)

## 2020-01-09 ENCOUNTER — Other Ambulatory Visit: Payer: Self-pay | Admitting: Urology

## 2020-01-15 ENCOUNTER — Ambulatory Visit (HOSPITAL_COMMUNITY)
Admission: RE | Admit: 2020-01-15 | Discharge: 2020-01-15 | Disposition: A | Discharge status: Home / Self Care | Payer: Medicare Other | Source: Ambulatory Visit | Attending: Hematology | Admitting: Hematology

## 2020-01-15 ENCOUNTER — Inpatient Hospital Stay (HOSPITAL_COMMUNITY): Payer: Medicare Other | Attending: Hematology

## 2020-01-15 ENCOUNTER — Other Ambulatory Visit: Payer: Self-pay

## 2020-01-15 ENCOUNTER — Encounter: Payer: Self-pay | Admitting: Urology

## 2020-01-15 ENCOUNTER — Ambulatory Visit (INDEPENDENT_AMBULATORY_CARE_PROVIDER_SITE_OTHER): Payer: Medicare Other | Admitting: Urology

## 2020-01-15 VITALS — BP 154/85 | HR 82 | Temp 97.0°F | Ht 67.0 in | Wt 197.0 lb

## 2020-01-15 DIAGNOSIS — Z8546 Personal history of malignant neoplasm of prostate: Secondary | ICD-10-CM | POA: Diagnosis not present

## 2020-01-15 DIAGNOSIS — K7460 Unspecified cirrhosis of liver: Secondary | ICD-10-CM | POA: Insufficient documentation

## 2020-01-15 DIAGNOSIS — R160 Hepatomegaly, not elsewhere classified: Secondary | ICD-10-CM | POA: Insufficient documentation

## 2020-01-15 DIAGNOSIS — K5730 Diverticulosis of large intestine without perforation or abscess without bleeding: Secondary | ICD-10-CM | POA: Insufficient documentation

## 2020-01-15 DIAGNOSIS — N2889 Other specified disorders of kidney and ureter: Secondary | ICD-10-CM | POA: Diagnosis present

## 2020-01-15 DIAGNOSIS — C649 Malignant neoplasm of unspecified kidney, except renal pelvis: Secondary | ICD-10-CM

## 2020-01-15 DIAGNOSIS — K4190 Unilateral femoral hernia, without obstruction or gangrene, not specified as recurrent: Secondary | ICD-10-CM | POA: Diagnosis not present

## 2020-01-15 DIAGNOSIS — R599 Enlarged lymph nodes, unspecified: Secondary | ICD-10-CM | POA: Insufficient documentation

## 2020-01-15 DIAGNOSIS — K8020 Calculus of gallbladder without cholecystitis without obstruction: Secondary | ICD-10-CM | POA: Diagnosis not present

## 2020-01-15 DIAGNOSIS — C61 Malignant neoplasm of prostate: Secondary | ICD-10-CM

## 2020-01-15 DIAGNOSIS — N281 Cyst of kidney, acquired: Secondary | ICD-10-CM | POA: Insufficient documentation

## 2020-01-15 DIAGNOSIS — D649 Anemia, unspecified: Secondary | ICD-10-CM | POA: Diagnosis not present

## 2020-01-15 DIAGNOSIS — Z85528 Personal history of other malignant neoplasm of kidney: Secondary | ICD-10-CM | POA: Insufficient documentation

## 2020-01-15 DIAGNOSIS — Z923 Personal history of irradiation: Secondary | ICD-10-CM | POA: Diagnosis not present

## 2020-01-15 LAB — CBC WITH DIFFERENTIAL/PLATELET
Abs Immature Granulocytes: 0.02 10*3/uL (ref 0.00–0.07)
Basophils Absolute: 0 10*3/uL (ref 0.0–0.1)
Basophils Relative: 0 %
Eosinophils Absolute: 0.2 10*3/uL (ref 0.0–0.5)
Eosinophils Relative: 3 %
HCT: 37.5 % — ABNORMAL LOW (ref 39.0–52.0)
Hemoglobin: 11.3 g/dL — ABNORMAL LOW (ref 13.0–17.0)
Immature Granulocytes: 0 %
Lymphocytes Relative: 24 %
Lymphs Abs: 1.3 10*3/uL (ref 0.7–4.0)
MCH: 26.6 pg (ref 26.0–34.0)
MCHC: 30.1 g/dL (ref 30.0–36.0)
MCV: 88.2 fL (ref 80.0–100.0)
Monocytes Absolute: 0.7 10*3/uL (ref 0.1–1.0)
Monocytes Relative: 13 %
Neutro Abs: 3.2 10*3/uL (ref 1.7–7.7)
Neutrophils Relative %: 60 %
Platelets: 239 10*3/uL (ref 150–400)
RBC: 4.25 MIL/uL (ref 4.22–5.81)
RDW: 18.2 % — ABNORMAL HIGH (ref 11.5–15.5)
WBC: 5.4 10*3/uL (ref 4.0–10.5)
nRBC: 0 % (ref 0.0–0.2)

## 2020-01-15 LAB — COMPREHENSIVE METABOLIC PANEL
ALT: 20 U/L (ref 0–44)
AST: 23 U/L (ref 15–41)
Albumin: 3.8 g/dL (ref 3.5–5.0)
Alkaline Phosphatase: 74 U/L (ref 38–126)
Anion gap: 12 (ref 5–15)
BUN: 26 mg/dL — ABNORMAL HIGH (ref 8–23)
CO2: 23 mmol/L (ref 22–32)
Calcium: 9.1 mg/dL (ref 8.9–10.3)
Chloride: 103 mmol/L (ref 98–111)
Creatinine, Ser: 1.39 mg/dL — ABNORMAL HIGH (ref 0.61–1.24)
GFR calc Af Amer: 57 mL/min — ABNORMAL LOW (ref >60)
GFR calc non Af Amer: 49 mL/min — ABNORMAL LOW (ref >60)
Glucose, Bld: 135 mg/dL — ABNORMAL HIGH (ref 70–99)
Potassium: 4.8 mmol/L (ref 3.5–5.1)
Sodium: 138 mmol/L (ref 135–145)
Total Bilirubin: 0.5 mg/dL (ref 0.3–1.2)
Total Protein: 8.4 g/dL — ABNORMAL HIGH (ref 6.5–8.1)

## 2020-01-15 MED ORDER — IOHEXOL 300 MG/ML  SOLN
100.0000 mL | Freq: Once | INTRAMUSCULAR | Status: AC | PRN
  Administered 2020-01-15: 11:00:00 100 mL via INTRAVENOUS

## 2020-01-15 NOTE — Progress Notes (Signed)
01/15/2020 3:32 PM   Jon Brooks. 08-06-1941 [id number]  Referring provider: Sinda Du, MD No address on file  Prostate Cancer and RCC followup  HPI: Jon Brooks is a 79yo with a hx of prostate cancer and L RCC s/p radical nephrectomy here for followup. PSA remains undetectable. Patient off ADT. CMP normal. Creatinine 1.39. CT from 06/2019 shows no evidence of metastatic disease.Good energy. Occasional hot flashes. No LUTS. NO new pain.   His records from [facility] are as follows:  Prostate Cancer 11/09/2016: Biopsy revealed Gleason 4+5=9 and 4+4=8 in 3/12 cores on the right.  12/14/2016: He underwent metastatic survery which showed enlarged pelvic lymph nodes and a 10cm left renal mass with lymphadenopathy. 02/22/2017: He has 2 episodes of hot flashes. He is currently being treated with ADT for his high risk prostate cancer. He is recovering from his left open nephrectomy.  05/24/2017: He has occasional hot flashes. no LUTS. no bone pain. no recent PSA  08/23/2017: PSA was 1.3 last week. mild hot flashes. Dr. Tammi Klippel recommended brachy with IMRT. No LUTS  10/18/2017: s/p brachytherapy here for voiding trial  11/22/2017: Pt is currently undergoing IMRT for high risk prostate cancer. Pt is due for lupron today  02/28/3018: Pt did not get PSA today. He is due for lurpon in 3 months  05/30/2018: PSA undetectable  12/05/2018: PSa remains undetectable  06/12/2019: PSA remains undetectable. He has finished 2 years of ADT   Left RCC: 05/24/2017: CT scan from 05/15/17 shows no evidence of metastatic disease. CT did show 3 vessel coronary calcifications.  08/23/2017: Pt doing well. no pain at incision. He is being followed by Dr. Talbert Cage in medical oncology. Pt has NED  11/22/2017: Pt did not get Ct scan. Creatinine is 1.5  02/28/2018: no recent CMP. CT showed NED. energy good  05/30/2018: Pt to undergo CT in 2 weeks  12/05/2018: CT from 10/2018 showed no evidence of metastatic disease. No fatigue   06/12/2019: CMP normal. Chest CT normal. CT abd showed possible enlargement of right calcified mid pole renal mass.      PMH: Past Medical History:  Diagnosis Date  . Anemia   . Diverticulosis of colon   . Frequency of urination   . Glaucoma, both eyes   . Heart murmur   . History of adenomatous polyp of colon 05/16/2013  . History of transient ischemic attack (TIA) 06/19/2015   no residual  . Hot flashes   . Hyperlipidemia   . Nocturia   . Prostate cancer [facility]) urologist-  dr Alyson Ingles  oncologist -- dr Tammi Klippel   dx 11-08-20147 via bx--- Gleason 4+5,  PSA 14.1, vol 52cc/  08-16-2017 last PSA  1.3 (per dr Alyson Ingles office note)    . Renal cell carcinoma, left [facility]) oncologist-  dr Barbaraann Share zhou ([facility])/  urologist-  dr Alyson Ingles   Stage III (pT3a, pN0)  Chromophobe RCC into perinephric fat /  01-20-2017 s/p  open radical left nephrecotmy/- per [facility] oncologist note -- no evidence of disease  . Sarcoidosis    per pt year 1980--- last Chest CT 05-15-2017  . Type 2 diabetes mellitus ([place])   . Wears partial dentures    upper and lower    Surgical History: Past Surgical History:  Procedure Laterality Date  . COLONOSCOPY N/A 05/16/2013   Procedure: COLONOSCOPY;  Surgeon: Daneil Dolin, MD;  [facility];  Service: Endoscopy;  Laterality: N/A;  9:30  . cyst on left shoulder  yrs ago  .  CYSTOSCOPY N/A 10/10/2017   Procedure: CYSTOSCOPY FLEXIBLE;  Surgeon: Cleon Gustin, MD;  Location: [facility];  Service: Urology;  Laterality: N/A;  2 seeds removed from bladder per Dr Alyson Ingles  . GOLD SEED IMPLANT N/A 10/10/2017   Procedure: GOLD SEED IMPLANT;  Surgeon: Cleon Gustin, MD;  Location: [facility];  Service: Urology;  Laterality: N/A;  3 gold seeds implanted  . MOLE REMOVAL     lower back by dr Romona Curls  . NEPHRECTOMY Left 01/20/2017   Procedure: OPEN NEPHRECTOMY;  Surgeon: Cleon Gustin, MD;  [facility];   Service: Urology;  Laterality: Left;  . RADIOACTIVE SEED IMPLANT N/A 10/10/2017   Procedure: RADIOACTIVE SEED IMPLANT/BRACHYTHERAPY IMPLANT;  Surgeon: Cleon Gustin, MD;  Location: [facility];  Service: Urology;  Laterality: N/A;  57 seeds implanted  . SPACE OAR INSTILLATION N/A 10/10/2017   Procedure: SPACE OAR INSTILLATION;  Surgeon: Cleon Gustin, MD;  Location: [facility];  Service: Urology;  Laterality: N/A;  . TRANSTHORACIC ECHOCARDIOGRAM  06/20/2015   mild to moderate LVH, ef 60-65%/  trivial Jon    Home Medications:  Allergies as of 01/15/2020   No Known Allergies     Medication List       Accurate as of January 15, 2020  3:32 PM. If you have any questions, ask your nurse or doctor.        aspirin EC 81 MG tablet Take 81 mg by mouth every evening.   atorvastatin 80 MG tablet Commonly known as: LIPITOR Take 1 tablet (80 mg total) by mouth daily at 6 PM. What changed: when to take this   Azopt 1 % ophthalmic suspension Generic drug: brinzolamide Place 1 drop into both eyes 2 (two) times daily.   lisinopril 2.5 MG tablet Commonly known as: ZESTRIL Take 1 tablet (2.5 mg total) by mouth daily. What changed: when to take this   LUPRON IJ Inject as directed every 3 (three) months.   metFORMIN 500 MG tablet Commonly known as: Glucophage Take 1 tablet (500 mg total) by mouth 2 (two) times daily with a meal. What changed: when to take this       Allergies: No Known Allergies  Family History: Family History  Problem Relation Age of Onset  . Liver disease Father        NASH? age 88  . Diabetes Father   . Colon cancer Neg Hx     Social History:  reports that he has never smoked. He has never used smokeless tobacco. He reports that he does not drink alcohol or use drugs.  ROS: All other review of systems were reviewed and are negative except what is noted above in HPI  Physical Exam: BP (!) 154/85   Pulse 82    Temp (!) 97 F (36.1 C)   Ht 5\' 7"  (1.702 m)   Wt 197 lb (89.4 kg)   BMI 30.85 kg/m   Constitutional:  Alert and oriented, No acute distress. HEENT: [place] AT, moist mucus membranes.  Trachea midline, no masses. Cardiovascular: No clubbing, cyanosis, or edema. Respiratory: Normal respiratory effort, no increased work of breathing. GI: Abdomen is soft, nontender, nondistended, no abdominal masses GU: No CVA tenderness Lymph: No cervical or inguinal lymphadenopathy. Skin: No rashes, bruises or suspicious lesions. Neurologic: Grossly intact, no focal deficits, moving all 4 extremities. Psychiatric: Normal mood and affect.  Laboratory Data: Lab Results  Component Value Date   WBC 5.4 01/15/2020  HGB 11.3 (L) 01/15/2020   HCT 37.5 (L) 01/15/2020   MCV 88.2 01/15/2020   PLT 239 01/15/2020    Lab Results  Component Value Date   CREATININE 1.39 (H) 01/15/2020    Lab Results  Component Value Date   PSA <0.1 01/08/2020   PSA 25.28 (H) 12/22/2016    No results found for: TESTOSTERONE  Lab Results  Component Value Date   HGBA1C 6.4 (H) 01/18/2017    Urinalysis    Component Value Date/Time   COLORURINE YELLOW 06/19/2015 1819   APPEARANCEUR CLEAR 06/19/2015 1819   LABSPEC 1.025 06/19/2015 1819   PHURINE 5.5 06/19/2015 1819   GLUCOSEU 100 (A) 06/19/2015 1819   HGBUR TRACE (A) 06/19/2015 1819   BILIRUBINUR NEGATIVE 06/19/2015 1819   KETONESUR NEGATIVE 06/19/2015 1819   PROTEINUR NEGATIVE 06/19/2015 1819   UROBILINOGEN 0.2 06/19/2015 1819   NITRITE POSITIVE (A) 06/19/2015 1819   LEUKOCYTESUR MODERATE (A) 06/19/2015 1819    Lab Results  Component Value Date   BACTERIA MANY (A) 06/19/2015    Pertinent Imaging: CT abd/pelvic 06/2019 No results found for this or any previous visit. No results found for this or any previous visit. No results found for this or any previous visit. No results found for this or any previous visit. No results found for this or any previous  visit. No results found for this or any previous visit. No results found for this or any previous visit. No results found for this or any previous visit.  Assessment & Plan:    1. Prostate cancer ([place]) -RTC 6 months with PSA  2. Renal mass -RTC 6 months with CXR and CMP   No follow-ups on file.  Nicolette Bang, MD  [facility] [place]

## 2020-01-15 NOTE — Progress Notes (Signed)
Urological Symptom Review  Patient is experiencing the following symptoms: Get up at night to urinate   Review of Systems  Gastrointestinal (upper)  : Negative for upper GI symptoms  Gastrointestinal (lower) : Constipation  Constitutional : Negative for symptoms  Skin: Negative for skin symptoms  Eyes: Negative for eye symptoms  Ear/Nose/Throat : Negative for Ear/Nose/Throat symptoms  Hematologic/Lymphatic: Negative for Hematologic/Lymphatic symptoms  Cardiovascular : Negative for cardiovascular symptoms  Respiratory : Negative for respiratory symptoms  Endocrine: Negative for endocrine symptoms  Musculoskeletal: Negative for musculoskeletal symptoms  Neurological: Negative for neurological symptoms  Psychologic: Negative for psychiatric symptoms  

## 2020-01-15 NOTE — Patient Instructions (Signed)
Prostate Cancer  The prostate is a male gland that helps make semen. Prostate cancer is when abnormal cells grow in this gland. Follow these instructions at home:  Take over-the-counter and prescription medicines only as told by your doctor.  Eat a healthy diet.  Get plenty of sleep.  Ask your doctor for help to find a support group for men with prostate cancer.  Keep all follow-up visits as told by your doctor. This is important.  If you have to go to the hospital, let your cancer doctor (oncologist) know.  Touch, hold, hug, and caress your partner to continue to show sexual feelings. Contact a doctor if:  You have trouble peeing (urinating).  You have blood in your pee (urine).  You have pain in your hips, back, or chest. Get help right away if:  You have weakness in your legs.  You lose feeling (have numbness) in your legs.  You cannot control your pee or your poop (stool).  You have trouble breathing.  You have sudden pain in your chest.  You have chills or a fever. Summary  The prostate is a male gland that helps make semen. Prostate cancer is when abnormal cells grow in this gland.  Ask your doctor for help to find a support group for men with prostate cancer.  Contact a doctor if you have problems peeing or have any new pain that you did not have before. This information is not intended to replace advice given to you by your health care provider. Make sure you discuss any questions you have with your health care provider. Document Revised: 11/24/2017 Document Reviewed: 08/22/2016 Elsevier Patient Education  2020 Elsevier Inc.  

## 2020-01-22 ENCOUNTER — Inpatient Hospital Stay (HOSPITAL_BASED_OUTPATIENT_CLINIC_OR_DEPARTMENT_OTHER): Payer: Medicare Other | Admitting: Hematology

## 2020-01-23 ENCOUNTER — Inpatient Hospital Stay (HOSPITAL_BASED_OUTPATIENT_CLINIC_OR_DEPARTMENT_OTHER): Payer: Medicare Other | Admitting: Hematology

## 2020-01-23 ENCOUNTER — Encounter (HOSPITAL_COMMUNITY): Payer: Self-pay | Admitting: Hematology

## 2020-01-23 DIAGNOSIS — K7689 Other specified diseases of liver: Secondary | ICD-10-CM

## 2020-01-23 DIAGNOSIS — N2889 Other specified disorders of kidney and ureter: Secondary | ICD-10-CM

## 2020-01-23 NOTE — Addendum Note (Signed)
Addended by: Farley Ly on: 01/23/2020 11:14 AM   Modules accepted: Orders

## 2020-01-23 NOTE — Progress Notes (Signed)
Virtual Visit via Telephone Note  I connected with Jon Brooks. on 01/23/20 at  9:45 AM EST by telephone and verified that I am speaking with the correct person using two identifiers.   I discussed the limitations, risks, security and privacy concerns of performing an evaluation and management service by telephone and the availability of in person appointments. I also discussed with the patient that there may be a patient responsible charge related to this service. The patient expressed understanding and agreed to proceed.   History of Present Illness: He follows up in our clinic for renal cell carcinoma, status post left nephrectomy on 01/20/2017 on observation.  He also has right kidney mass for which we are following.  He has high-grade prostate cancer for which she was treated with IMRT in December 2018.  He is on Lupron injections.   Observations/Objective: Denies any fevers, night sweats or weight loss.  Denies any bleeding per rectum or melena.  No hematuria was reported.  He reports some constipation.  Appetite and energy levels are 100%.  Assessment and Plan:  1.  Right kidney mass: -We reviewed results of the CT [facility] from 01/15/2020.  Stable appearance of the right mid kidney exophytic lesion with a large ossific component, smaller fatty competent and soft tissue component.  This is probably benign compared to 12/09/2016 scan.  2.  Normocytic anemia: -Hemoglobin is 11.3.  This is from combination of CKD and relative iron deficiency. -I plan to check ferritin, iron panel, [id number] and folic acid prior to next visit in 3 months.  3.  Liver mass: -He has cirrhosis of the liver with 2 small new arterial phase enhancing lesions in the left hepatic lobe, LI-RADS 3. -I have recommended a triple phase CT scan of the abdomen in 3 months. -We will also check hepatitis serology and AFP.  4.  High-grade prostate cancer: -He had prostate cancer with Gleason 4+5, PSA 14.1.  He was treated with IMRT  and seed implants from 11/07/2017 through 12/14/2017. -Last PSA was 0.1.   Follow Up Instructions:   RTC 3 months with scans and labs. I discussed the assessment and treatment plan with the patient. The patient was provided an opportunity to ask questions and all were answered. The patient agreed with the plan and demonstrated an understanding of the instructions.   The patient was advised to call back or seek an in-person evaluation if the symptoms worsen or if the condition fails to improve as anticipated.  I provided 11 minutes of non-face-to-face time during this encounter.   Derek Jack, MD

## 2020-01-30 ENCOUNTER — Other Ambulatory Visit: Payer: Self-pay

## 2020-01-30 ENCOUNTER — Encounter: Payer: Self-pay | Admitting: Family Medicine

## 2020-01-30 ENCOUNTER — Ambulatory Visit (INDEPENDENT_AMBULATORY_CARE_PROVIDER_SITE_OTHER): Payer: Medicare Other | Admitting: Family Medicine

## 2020-01-30 VITALS — BP 146/77 | HR 80 | Temp 98.6°F | Ht 67.0 in | Wt 197.6 lb

## 2020-01-30 DIAGNOSIS — E1165 Type 2 diabetes mellitus with hyperglycemia: Secondary | ICD-10-CM | POA: Diagnosis not present

## 2020-01-30 DIAGNOSIS — E785 Hyperlipidemia, unspecified: Secondary | ICD-10-CM | POA: Diagnosis not present

## 2020-01-30 DIAGNOSIS — E1169 Type 2 diabetes mellitus with other specified complication: Secondary | ICD-10-CM | POA: Diagnosis not present

## 2020-01-30 DIAGNOSIS — I10 Essential (primary) hypertension: Secondary | ICD-10-CM

## 2020-01-30 LAB — POCT GLYCOSYLATED HEMOGLOBIN (HGB A1C): Hemoglobin A1C: 6.7 % — AB (ref 4.0–5.6)

## 2020-01-30 MED ORDER — GLIPIZIDE ER 2.5 MG PO TB24: 2.5000 mg | ORAL_TABLET | Freq: Every day | ORAL | 2 refills | Status: DC

## 2020-01-30 NOTE — Patient Instructions (Addendum)
Obtain lipid panel-FASTING-[facility]  Stop metformin-concern for kidney function-START glipizide 2.5mg  with breakfast  Continue checking fasting glucose in the morning-record  Check blood pressure-first thing in the morning and record

## 2020-01-30 NOTE — Progress Notes (Signed)
New Patient Office Visit  Subjective:  Patient ID: Jon Brooks., male    DOB: Dec 30, 1940  Age: 79 y.o. MRN: [id number]  CC:  Chief Complaint  Patient presents with  . Establish Care  DM/hyperlipidemia  HPI Jon Brooks. presents for discussion about DM/Hyperlipidemia DM-fasting glucose 133-145, lowest 95-metformin daily for last two months-diagnosis-DM-2016-A1c 6.7% Hyperlipidemia-atorvastatin-stable Prostate CA-followed by Dr. Jacquenette Shone scan and blood work- Previously renal cell cancer-left kidney removed Past Medical History:  Diagnosis Date  . Anemia   . Cataract   . Diverticulosis of colon   . Frequency of urination   . Glaucoma, both eyes   . Heart murmur   . History of adenomatous polyp of colon 05/16/2013  . History of transient ischemic attack (TIA) 06/19/2015   no residual  . Hot flashes   . Hyperlipidemia   . Nocturia   . Prostate cancer [facility]) urologist-  dr Alyson Ingles  oncologist -- dr Tammi Klippel   dx 11-08-20147 via bx--- Gleason 4+5,  PSA 14.1, vol 52cc/  08-16-2017 last PSA  1.3 (per dr Alyson Ingles office note)    . Renal cell carcinoma, left [facility]) oncologist-  dr Barbaraann Share zhou ([facility])/  urologist-  dr Alyson Ingles   Stage III (pT3a, pN0)  Chromophobe RCC into perinephric fat /  01-20-2017 s/p  open radical left nephrecotmy/- per [facility] oncologist note -- no evidence of disease  . Sarcoidosis    per pt year 1980--- last Chest CT 05-15-2017  . Type 2 diabetes mellitus ([place])   . Wears partial dentures    upper and lower    Past Surgical History:  Procedure Laterality Date  . COLONOSCOPY N/A 05/16/2013   Procedure: COLONOSCOPY;  Surgeon: Daneil Dolin, MD;  [facility];  Service: Endoscopy;  Laterality: N/A;  9:30  . cyst on left shoulder  yrs ago  . CYSTOSCOPY N/A 10/10/2017   Procedure: CYSTOSCOPY FLEXIBLE;  Surgeon: Cleon Gustin, MD;  Location: [facility];  Service: Urology;  Laterality: N/A;  2 seeds removed from bladder  per Dr Alyson Ingles  . GOLD SEED IMPLANT N/A 10/10/2017   Procedure: GOLD SEED IMPLANT;  Surgeon: Cleon Gustin, MD;  Location: [facility];  Service: Urology;  Laterality: N/A;  3 gold seeds implanted  . MOLE REMOVAL     lower back by dr Romona Curls  . NEPHRECTOMY Left 01/20/2017   Procedure: OPEN NEPHRECTOMY;  Surgeon: Cleon Gustin, MD;  [facility];  Service: Urology;  Laterality: Left;  . PROSTATE SURGERY    . RADIOACTIVE SEED IMPLANT N/A 10/10/2017   Procedure: RADIOACTIVE SEED IMPLANT/BRACHYTHERAPY IMPLANT;  Surgeon: Cleon Gustin, MD;  Location: [facility];  Service: Urology;  Laterality: N/A;  57 seeds implanted  . SPACE OAR INSTILLATION N/A 10/10/2017   Procedure: SPACE OAR INSTILLATION;  Surgeon: Cleon Gustin, MD;  Location: [facility];  Service: Urology;  Laterality: N/A;  . TRANSTHORACIC ECHOCARDIOGRAM  06/20/2015   mild to moderate LVH, ef 60-65%/  trivial MR    Family History  Problem Relation Age of Onset  . Liver disease Father        NASH? age 81  . Diabetes Father   . Hypertension Mother   . Colon cancer Neg Hx     Social History   Socioeconomic History  . Marital status: Married    Spouse name: Not on file  . Number of children: 1  . Years of education: Not on  file  . Highest education level: Not on file  Occupational History    Employer: [employer]  Tobacco Use  . Smoking status: Never Smoker  . Smokeless tobacco: Never Used  Substance and Sexual Activity  . Alcohol use: No  . Drug use: No  . Sexual activity: Not Currently  Other Topics Concern  . Not on file  Social History Narrative   Daughter deceased age 51, liver disease ?NASH 2010   Social Determinants of Health   Financial Resource Strain:   . Difficulty of Paying Living Expenses: Not on file  Food Insecurity:   . Worried About Charity fundraiser in the Last Year: Not on file  . Ran Out of Food in the Last  Year: Not on file  Transportation Needs:   . Lack of Transportation (Medical): Not on file  . Lack of Transportation (Non-Medical): Not on file  Physical Activity:   . Days of Exercise per Week: Not on file  . Minutes of Exercise per Session: Not on file  Stress:   . Feeling of Stress : Not on file  Social Connections:   . Frequency of Communication with Friends and Family: Not on file  . Frequency of Social Gatherings with Friends and Family: Not on file  . Attends Religious Services: Not on file  . Active Member of Clubs or Organizations: Not on file  . Attends Archivist Meetings: Not on file  . Marital Status: Not on file  Intimate Partner Violence:   . Fear of Current or Ex-Partner: Not on file  . Emotionally Abused: Not on file  . Physically Abused: Not on file  . Sexually Abused: Not on file    ROS Review of Systems  HENT: Positive for rhinorrhea and sneezing.   Eyes:       Cataracts Glaucoma Sees Dr. Caprice Beaver q 6 months-last seen 2 weeks ago  Gastrointestinal: Positive for constipation.  Genitourinary: Positive for frequency.        PSA elevated -seeds placed    Objective:   Today's Vitals: BP (!) 146/77 (BP Location: Left Arm, Patient Position: Sitting, Cuff Size: Normal)   Pulse 80   Temp 98.6 F (37 C) (Oral)   Ht 5\' 7"  (1.702 m)   Wt 197 lb 9.6 oz (89.6 kg)   SpO2 96%   BMI 30.95 kg/m   Physical Exam Vitals reviewed.  Constitutional:      Appearance: Normal appearance.  HENT:     Head: Atraumatic.  Musculoskeletal:     Cervical back: Normal range of motion and neck supple.  Neurological:     General: No focal deficit present.     Mental Status: He is alert and oriented to person, place, and time.     Assessment & Plan:  1. Hyperlipidemia associated with type 2 diabetes mellitus (HCC) atrovastatin-stable - Lipid panel  2. Essential hypertension, benign Lisinopril -stable  3. Uncontrolled type 2 diabetes mellitus with  hyperglycemia ([place]) Hold metformin-concern for renal function- - POCT HgB A1C  Outpatient Encounter Medications as of 01/30/2020  Medication Sig  . aspirin EC 81 MG tablet Take 81 mg by mouth every evening.  [name] atorvastatin (LIPITOR) 80 MG tablet Take 1 tablet (80 mg total) by mouth daily at 6 PM. (Patient taking differently: Take 80 mg by mouth every evening. )  . AZOPT 1 % ophthalmic suspension Place 1 drop into both eyes 2 (two) times daily.   [name] lisinopril (PRINIVIL,ZESTRIL) 2.5 MG tablet Take 1  tablet (2.5 mg total) by mouth daily. (Patient taking differently: Take 2.5 mg by mouth every evening. )  . metFORMIN (GLUCOPHAGE) 500 MG tablet Take 1 tablet (500 mg total) by mouth 2 (two) times daily with a meal. (Patient taking differently: Take 500 mg by mouth every evening. )   Facility-Administered Encounter Medications as of 01/30/2020  Medication  . magnesium citrate solution 1 Bottle    Follow-up: 1 month-bring bp and glucose readings  Arnola Crittendon Hannah Beat, MD

## 2020-02-20 LAB — LIPID PANEL
Cholesterol: 218 mg/dL — ABNORMAL HIGH (ref <200)
HDL: 32 mg/dL — ABNORMAL LOW (ref >=40)
LDL Cholesterol (Calc): 149 mg/dL (calc) — ABNORMAL HIGH
Non-HDL Cholesterol (Calc): 186 mg/dL (calc) — ABNORMAL HIGH (ref <130)
Total CHOL/HDL Ratio: 6.8 (calc) — ABNORMAL HIGH (ref <5.0)
Triglycerides: 232 mg/dL — ABNORMAL HIGH (ref <150)

## 2020-02-27 ENCOUNTER — Other Ambulatory Visit: Payer: Self-pay

## 2020-02-27 ENCOUNTER — Encounter: Payer: Self-pay | Admitting: Family Medicine

## 2020-02-27 ENCOUNTER — Ambulatory Visit (INDEPENDENT_AMBULATORY_CARE_PROVIDER_SITE_OTHER): Payer: Medicare Other | Admitting: Family Medicine

## 2020-02-27 VITALS — BP 149/80 | HR 86 | Temp 97.6°F | Ht 67.0 in | Wt 201.8 lb

## 2020-02-27 DIAGNOSIS — E1165 Type 2 diabetes mellitus with hyperglycemia: Secondary | ICD-10-CM | POA: Diagnosis not present

## 2020-02-27 DIAGNOSIS — I10 Essential (primary) hypertension: Secondary | ICD-10-CM | POA: Diagnosis not present

## 2020-02-27 DIAGNOSIS — E785 Hyperlipidemia, unspecified: Secondary | ICD-10-CM | POA: Diagnosis not present

## 2020-02-27 DIAGNOSIS — E1169 Type 2 diabetes mellitus with other specified complication: Secondary | ICD-10-CM | POA: Diagnosis not present

## 2020-02-27 MED ORDER — GLIPIZIDE 5 MG PO TABS: 5.0000 mg | ORAL_TABLET | Freq: Two times a day (BID) | ORAL | 3 refills | Status: AC

## 2020-02-27 NOTE — Progress Notes (Signed)
Established Patient Office Visit  Subjective:  Patient ID: Jon Fowlks., male    DOB: [birth year]  Age: 79 y.o. MRN: [id number]  CC:  Chief Complaint  Patient presents with  . Follow-up    1 month f/u for diabetes. Was given glipizide 2.5 mg and I do not think it is helping me. I think i may need to be on a higher dose    HPI Jon Brooks. presents for DM/HTN Taking glucose readings and blood pressure readings daily. Pt with labwork showing elevated Cr and decrease in GFR  Past Medical History:  Diagnosis Date  . Anemia   . Cataract   . Diverticulosis of colon   . Frequency of urination   . Glaucoma, both eyes   . Heart murmur   . History of adenomatous polyp of colon 05/16/2013  . History of transient ischemic attack (TIA) 06/19/2015   no residual  . Hot flashes   . Hyperlipidemia   . Nocturia   . Prostate cancer [facility]) urologist-  dr Alyson Ingles  oncologist -- dr Tammi Klippel   dx 11-08-20147 via bx--- Gleason 4+5,  PSA 14.1, vol 52cc/  08-16-2017 last PSA  1.3 (per dr Alyson Ingles office note)    . Renal cell carcinoma, left [facility]) oncologist-  dr Barbaraann Share zhou ([facility])/  urologist-  dr Alyson Ingles   Stage III (pT3a, pN0)  Chromophobe RCC into perinephric fat /  01-20-2017 s/p  open radical left nephrecotmy/- per [facility] oncologist note -- no evidence of disease  . Sarcoidosis    per pt year 1980--- last Chest CT 05-15-2017  . Type 2 diabetes mellitus ([place])   . Wears partial dentures    upper and lower    Past Surgical History:  Procedure Laterality Date  . COLONOSCOPY N/A 05/16/2013   Procedure: COLONOSCOPY;  Surgeon: Daneil Dolin, MD;  [facility];  Service: Endoscopy;  Laterality: N/A;  9:30  . cyst on left shoulder  yrs ago  . CYSTOSCOPY N/A 10/10/2017   Procedure: CYSTOSCOPY FLEXIBLE;  Surgeon: Cleon Gustin, MD;  Location: [facility];  Service: Urology;  Laterality: N/A;  2 seeds removed from bladder per Dr Alyson Ingles  . GOLD SEED  IMPLANT N/A 10/10/2017   Procedure: GOLD SEED IMPLANT;  Surgeon: Cleon Gustin, MD;  Location: [facility];  Service: Urology;  Laterality: N/A;  3 gold seeds implanted  . MOLE REMOVAL     lower back by dr Romona Curls  . NEPHRECTOMY Left 01/20/2017   Procedure: OPEN NEPHRECTOMY;  Surgeon: Cleon Gustin, MD;  [facility];  Service: Urology;  Laterality: Left;  . PROSTATE SURGERY    . RADIOACTIVE SEED IMPLANT N/A 10/10/2017   Procedure: RADIOACTIVE SEED IMPLANT/BRACHYTHERAPY IMPLANT;  Surgeon: Cleon Gustin, MD;  [facility];  Service: Urology;  Laterality: N/A;  57 seeds implanted  . SPACE OAR INSTILLATION N/A 10/10/2017   Procedure: SPACE OAR INSTILLATION;  Surgeon: Cleon Gustin, MD;  Location: [facility];  Service: Urology;  Laterality: N/A;  . TRANSTHORACIC ECHOCARDIOGRAM  06/20/2015   mild to moderate LVH, ef 60-65%/  trivial MR    Family History  Problem Relation Age of Onset  . Liver disease Father        NASH? age 37  . Diabetes Father   . Hypertension Mother   . Colon cancer Neg Hx     Social History   Socioeconomic History  . Marital status:  Married    Spouse name: Not on file  . Number of children: 1  . Years of education: Not on file  . Highest education level: Not on file  Occupational History    Employer: [employer]  Tobacco Use  . Smoking status: Never Smoker  . Smokeless tobacco: Never Used  Substance and Sexual Activity  . Alcohol use: No  . Drug use: No  . Sexual activity: Not Currently  Other Topics Concern  . Not on file  Social History Narrative   Daughter deceased age 63, liver disease ?NASH 2010   Social Determinants of Health   Financial Resource Strain:   . Difficulty of Paying Living Expenses: Not on file  Food Insecurity:   . Worried About Charity fundraiser in the Last Year: Not on file  . Ran Out of Food in the Last Year: Not on file   Transportation Needs:   . Lack of Transportation (Medical): Not on file  . Lack of Transportation (Non-Medical): Not on file  Physical Activity:   . Days of Exercise per Week: Not on file  . Minutes of Exercise per Session: Not on file  Stress:   . Feeling of Stress : Not on file  Social Connections:   . Frequency of Communication with Friends and Family: Not on file  . Frequency of Social Gatherings with Friends and Family: Not on file  . Attends Religious Services: Not on file  . Active Member of Clubs or Organizations: Not on file  . Attends Archivist Meetings: Not on file  . Marital Status: Not on file  Intimate Partner Violence:   . Fear of Current or Ex-Partner: Not on file  . Emotionally Abused: Not on file  . Physically Abused: Not on file  . Sexually Abused: Not on file    Outpatient Medications Prior to Visit  Medication Sig Dispense Refill  . aspirin EC 81 MG tablet Take 81 mg by mouth every evening.    [name] atorvastatin (LIPITOR) 80 MG tablet Take 1 tablet (80 mg total) by mouth daily at 6 PM. (Patient taking differently: Take 80 mg by mouth every evening. ) 30 tablet 12  . AZOPT 1 % ophthalmic suspension Place 1 drop into both eyes 2 (two) times daily.     [name] glipiZIDE (GLIPIZIDE XL) 2.5 MG 24 hr tablet Take 1 tablet (2.5 mg total) by mouth daily with breakfast. 30 tablet 2  . lisinopril (PRINIVIL,ZESTRIL) 2.5 MG tablet Take 1 tablet (2.5 mg total) by mouth daily. (Patient taking differently: Take 2.5 mg by mouth every evening. ) 30 tablet 12   Facility-Administered Medications Prior to Visit  Medication Dose Route Frequency Provider Last Rate Last Admin  . magnesium citrate solution 1 Bottle  1 Bottle Oral Once McKenzie, Candee Furbish, MD        No Known Allergies  ROS Review of Systems  Constitutional: Negative.   Eyes:       Glasses No blurred vision  Respiratory: Negative.   Cardiovascular: Negative.   Gastrointestinal: Negative.   Endocrine:        DM  Genitourinary:       One kidney  Neurological: Negative.   Hematological: Negative.   Psychiatric/Behavioral: Agitation: nails cut.      Objective:    Physical Exam  Constitutional: He is oriented to person, place, and time. He appears well-developed and well-nourished.  HENT:  Head: Normocephalic and atraumatic.  Cardiovascular: Normal rate and regular rhythm.  Pulmonary/Chest:  Effort normal and breath sounds normal.  Neurological: He is oriented to person, place, and time.  Psychiatric: He has a normal mood and affect. His behavior is normal.    BP (!) 149/80 (BP Location: Left Arm, Patient Position: Sitting, Cuff Size: Normal)   Pulse 86   Temp 97.6 F (36.4 C) (Temporal)   Ht 5\' 7"  (1.702 m)   Wt 201 lb 12.8 oz (91.5 kg)   SpO2 97%   BMI 31.61 kg/m  Wt Readings from Last 3 Encounters:  02/27/20 201 lb 12.8 oz (91.5 kg)  01/30/20 197 lb 9.6 oz (89.6 kg)  01/15/20 197 lb (89.4 kg)     Health Maintenance Due  Topic Date Due  . FOOT EXAM  01/19/1953  . PNA vac Low Risk Adult (1 of 2 - PCV13) 01/20/2008   Lab Results  Component Value Date   WBC 5.4 01/15/2020   HGB 11.3 (L) 01/15/2020   HCT 37.5 (L) 01/15/2020   MCV 88.2 01/15/2020   PLT 239 01/15/2020   Lab Results  Component Value Date   NA 138 01/15/2020   K 4.8 01/15/2020   CO2 23 01/15/2020   GLUCOSE 135 (H) 01/15/2020   BUN 26 (H) 01/15/2020   CREATININE 1.39 (H) 01/15/2020   BILITOT 0.5 01/15/2020   ALKPHOS 74 01/15/2020   AST 23 01/15/2020   ALT 20 01/15/2020   PROT 8.4 (H) 01/15/2020   ALBUMIN 3.8 01/15/2020   CALCIUM 9.1 01/15/2020   ANIONGAP 12 01/15/2020   Lab Results  Component Value Date   CHOL 218 (H) 02/20/2020   Lab Results  Component Value Date   HDL 32 (L) 02/20/2020   Lab Results  Component Value Date   LDLCALC 149 (H) 02/20/2020   Lab Results  Component Value Date   TRIG 232 (H) 02/20/2020   Lab Results  Component Value Date   CHOLHDL 6.8 (H) 02/20/2020    Lab Results  Component Value Date   HGBA1C 6.7 (A) 01/30/2020      Assessment & Plan:  1. Essential hypertension, benign Pt will continue checking blood pressure daily Lisinopril-2.5-normal at home-reviewed bp readings 2. Hyperlipidemia associated with type 2 diabetes mellitus (HCC) lipitor stable  3. Uncontrolled type 2 diabetes mellitus with hyperglycemia (HCC) Improved on metformin-concern for Cr with 1 kidney-increase glipizide to 5mg  BID -pt will continue to check his glucose readings daily Follow-up:    Rinda Rollyson Hannah Beat, MD

## 2020-02-27 NOTE — Patient Instructions (Addendum)
COVID-19 Vaccine Information can be found at: ShippingScam.co.uk For questions related to vaccine distribution or appointments, please email [email or web address] or call [phone].   Increase glipizide to 5mg  twice a day Take blood pressure readings and glucose readings daily   If you have lab work done today you will be contacted with your lab results within the next 2 weeks.  If you have not heard from [place] then please contact us. The fastest way to get your results is to register for My Chart.   IF you received an x-ray today, you will receive an invoice from [facility]. Please contact [facility] at [phone] with questions or concerns regarding your invoice.   IF you received labwork today, you will receive an invoice from [place]. Please contact [facility] at [phone] with questions or concerns regarding your invoice.   Our billing staff will not be able to assist you with questions regarding bills from these companies.  You will be contacted with the lab results as soon as they are available. The fastest way to get your results is to activate your My Chart account. Instructions are located on the last page of this paperwork. If you have not heard from [place] regarding the results in 2 weeks, please contact this office.

## 2020-04-01 ENCOUNTER — Encounter: Payer: Self-pay | Admitting: Family Medicine

## 2020-04-01 ENCOUNTER — Other Ambulatory Visit: Payer: Self-pay

## 2020-04-01 ENCOUNTER — Ambulatory Visit (INDEPENDENT_AMBULATORY_CARE_PROVIDER_SITE_OTHER): Payer: Medicare Other | Admitting: Family Medicine

## 2020-04-01 VITALS — BP 142/82 | HR 92 | Temp 97.6°F | Ht 67.0 in | Wt 201.2 lb

## 2020-04-01 DIAGNOSIS — E1169 Type 2 diabetes mellitus with other specified complication: Secondary | ICD-10-CM

## 2020-04-01 DIAGNOSIS — E1165 Type 2 diabetes mellitus with hyperglycemia: Secondary | ICD-10-CM | POA: Diagnosis not present

## 2020-04-01 DIAGNOSIS — I10 Essential (primary) hypertension: Secondary | ICD-10-CM | POA: Diagnosis not present

## 2020-04-01 DIAGNOSIS — E785 Hyperlipidemia, unspecified: Secondary | ICD-10-CM

## 2020-04-01 NOTE — Progress Notes (Signed)
Established Patient Office Visit  Subjective:  Patient ID: Jon Kisler., male    DOB: [birth year]  Age: 79 y.o. MRN: [id number]  CC:  Chief Complaint  Patient presents with  . Follow-up    1 month f/u on bp    HPI Jon Brooks. presents for HTN/Hyperlipidemia/DM DM-continue to take glucotrol BID-checking glucose daily-reviewed home readings HTN-take lisinopril daily-checking bp daily-checking glucose daily-reviewed home readings Hyperlipidemia-take Lipitor daily  Past Medical History:  Diagnosis Date  . Anemia   . Cataract   . Diverticulosis of colon   . Frequency of urination   . Glaucoma, both eyes   . Heart murmur   . History of adenomatous polyp of colon 05/16/2013  . History of transient ischemic attack (TIA) 06/19/2015   no residual  . Hot flashes   . Hyperlipidemia   . Nocturia   . Prostate cancer [facility]) urologist-  dr Alyson Ingles  oncologist -- dr Tammi Klippel   dx 11-08-20147 via bx--- Gleason 4+5,  PSA 14.1, vol 52cc/  08-16-2017 last PSA  1.3 (per dr Alyson Ingles office note)    . Renal cell carcinoma, left [facility]) oncologist-  dr Barbaraann Share zhou ([facility])/  urologist-  dr Alyson Ingles   Stage III (pT3a, pN0)  Chromophobe RCC into perinephric fat /  01-20-2017 s/p  open radical left nephrecotmy/- per [facility] oncologist note -- no evidence of disease  . Sarcoidosis    per pt year 1980--- last Chest CT 05-15-2017  . Type 2 diabetes mellitus ([place])   . Wears partial dentures    upper and lower    Past Surgical History:  Procedure Laterality Date  . COLONOSCOPY N/A 05/16/2013   Procedure: COLONOSCOPY;  Surgeon: Daneil Dolin, MD;  [facility];  Service: Endoscopy;  Laterality: N/A;  9:30  . cyst on left shoulder  yrs ago  . CYSTOSCOPY N/A 10/10/2017   Procedure: CYSTOSCOPY FLEXIBLE;  Surgeon: Cleon Gustin, MD;  Location: [facility];  Service: Urology;  Laterality: N/A;  2 seeds removed from bladder per Dr Alyson Ingles  . GOLD SEED IMPLANT  N/A 10/10/2017   Procedure: GOLD SEED IMPLANT;  Surgeon: Cleon Gustin, MD;  Location: [facility];  Service: Urology;  Laterality: N/A;  3 gold seeds implanted  . MOLE REMOVAL     lower back by dr Romona Curls  . NEPHRECTOMY Left 01/20/2017   Procedure: OPEN NEPHRECTOMY;  Surgeon: Cleon Gustin, MD;  [facility];  Service: Urology;  Laterality: Left;  . PROSTATE SURGERY    . RADIOACTIVE SEED IMPLANT N/A 10/10/2017   Procedure: RADIOACTIVE SEED IMPLANT/BRACHYTHERAPY IMPLANT;  Surgeon: Cleon Gustin, MD;  [facility];  Service: Urology;  Laterality: N/A;  57 seeds implanted  . SPACE OAR INSTILLATION N/A 10/10/2017   Procedure: SPACE OAR INSTILLATION;  Surgeon: Cleon Gustin, MD;  Location: [facility];  Service: Urology;  Laterality: N/A;  . TRANSTHORACIC ECHOCARDIOGRAM  06/20/2015   mild to moderate LVH, ef 60-65%/  trivial MR    Family History  Problem Relation Age of Onset  . Liver disease Father        NASH? age 53  . Diabetes Father   . Hypertension Mother   . Colon cancer Neg Hx     Social History   Socioeconomic History  . Marital status: Married    Spouse name: Not on file  . Number of children: 1  . Years of education: Not on  file  . Highest education level: Not on file  Occupational History    Employer: [employer]  Tobacco Use  . Smoking status: Never Smoker  . Smokeless tobacco: Never Used  Substance and Sexual Activity  . Alcohol use: No  . Drug use: No  . Sexual activity: Not Currently  Other Topics Concern  . Not on file  Social History Narrative   Daughter deceased age 45, liver disease ?NASH 2010   Social Determinants of Health   Financial Resource Strain:   . Difficulty of Paying Living Expenses:   Food Insecurity:   . Worried About Charity fundraiser in the Last Year:   . Arboriculturist in the Last Year:   Transportation Needs:   . Film/video editor  (Medical):   [name] Lack of Transportation (Non-Medical):   Physical Activity:   . Days of Exercise per Week:   . Minutes of Exercise per Session:   Stress:   . Feeling of Stress :   Social Connections:   . Frequency of Communication with Friends and Family:   . Frequency of Social Gatherings with Friends and Family:   . Attends Religious Services:   . Active Member of Clubs or Organizations:   . Attends Archivist Meetings:   [name] Marital Status:   Intimate Partner Violence:   . Fear of Current or Ex-Partner:   . Emotionally Abused:   [name] Physically Abused:   . Sexually Abused:     Outpatient Medications Prior to Visit  Medication Sig Dispense Refill  . aspirin EC 81 MG tablet Take 81 mg by mouth every evening.    [name] atorvastatin (LIPITOR) 80 MG tablet Take 1 tablet (80 mg total) by mouth daily at 6 PM. (Patient taking differently: Take 80 mg by mouth every evening. ) 30 tablet 12  . AZOPT 1 % ophthalmic suspension Place 1 drop into both eyes 2 (two) times daily.     [name] glipiZIDE (GLUCOTROL) 5 MG tablet Take 1 tablet (5 mg total) by mouth 2 (two) times daily before a meal. 60 tablet 3  . lisinopril (PRINIVIL,ZESTRIL) 2.5 MG tablet Take 1 tablet (2.5 mg total) by mouth daily. (Patient taking differently: Take 2.5 mg by mouth every evening. ) 30 tablet 12   Facility-Administered Medications Prior to Visit  Medication Dose Route Frequency Provider Last Rate Last Admin  . magnesium citrate solution 1 Bottle  1 Bottle Oral Once McKenzie, Candee Furbish, MD        No Known Allergies  ROS Review of Systems  Eyes:       Glasses  Respiratory: Negative.   Cardiovascular: Negative.   Gastrointestinal: Negative.   Endocrine:       DM  Genitourinary:       Prostate CA Renal cell CA  Psychiatric/Behavioral: Negative.       Objective:    Physical Exam  Constitutional: He is oriented to person, place, and time. He appears well-developed and well-nourished.  HENT:  Head:  Normocephalic and atraumatic.  Eyes: Conjunctivae are normal.  Cardiovascular: Normal rate, normal heart sounds and intact distal pulses.  Pulmonary/Chest: Effort normal and breath sounds normal.  Neurological: He is oriented to person, place, and time.  Psychiatric: He has a normal mood and affect. His behavior is normal.    BP (!) 142/82 (BP Location: Left Arm, Patient Position: Sitting, Cuff Size: Large)   Pulse 92   Temp 97.6 F (36.4 C) (Temporal)   Ht 5'  7" (1.702 m)   Wt 201 lb 3.2 oz (91.3 kg)   SpO2 97%   BMI 31.51 kg/m  Wt Readings from Last 3 Encounters:  04/01/20 201 lb 3.2 oz (91.3 kg)  02/27/20 201 lb 12.8 oz (91.5 kg)  01/30/20 197 lb 9.6 oz (89.6 kg)     Health Maintenance Due  Topic Date Due  . FOOT EXAM  Never done  . TETANUS/TDAP  Never done  . PNA vac Low Risk Adult (1 of 2 - PCV13) Never done    Lab Results  Component Value Date   WBC 5.4 01/15/2020   HGB 11.3 (L) 01/15/2020   HCT 37.5 (L) 01/15/2020   MCV 88.2 01/15/2020   PLT 239 01/15/2020   Lab Results  Component Value Date   NA 138 01/15/2020   K 4.8 01/15/2020   CO2 23 01/15/2020   GLUCOSE 135 (H) 01/15/2020   BUN 26 (H) 01/15/2020   CREATININE 1.39 (H) 01/15/2020   BILITOT 0.5 01/15/2020   ALKPHOS 74 01/15/2020   AST 23 01/15/2020   ALT 20 01/15/2020   PROT 8.4 (H) 01/15/2020   ALBUMIN 3.8 01/15/2020   CALCIUM 9.1 01/15/2020   ANIONGAP 12 01/15/2020   Lab Results  Component Value Date   CHOL 218 (H) 02/20/2020   Lab Results  Component Value Date   HDL 32 (L) 02/20/2020   Lab Results  Component Value Date   LDLCALC 149 (H) 02/20/2020   Lab Results  Component Value Date   TRIG 232 (H) 02/20/2020   Lab Results  Component Value Date   CHOLHDL 6.8 (H) 02/20/2020   Lab Results  Component Value Date   HGBA1C 6.7 (A) 01/30/2020      Assessment & Plan:  1. Essential hypertension, benign Lisinopril 2.5mg  -take daily, renal function Cr. 1.39 [place] daily-h/o TIA 2.  Uncontrolled type 2 diabetes mellitus with hyperglycemia (HCC) Improved with glucotrol 5mg  BID-continue to take glucose 3-4 times a day  3. Hyperlipidemia associated with type 2 diabetes mellitus (HCC) Lipitor 80mg  -recheck in 3 months-lipid panel  Follow-up: 3 months-primary care  Jessabelle Markiewicz Hannah Beat, MD

## 2020-04-01 NOTE — Patient Instructions (Addendum)
   COVID-19 Vaccine Information can be found at: [email or web address] For questions related to vaccine distribution or appointments, please email [email or web address] or call [phone].     If you have lab work done today you will be contacted with your lab results within the next 2 weeks.  If you have not heard from us then please contact us. The fastest way to get your results is to register for My Chart.   IF you received an x-ray today, you will receive an invoice from [facility]. Please contact [facility] at [phone] with questions or concerns regarding your invoice.   IF you received labwork today, you will receive an invoice from [facility]. Please contact [facility] at [phone] with questions or concerns regarding your invoice.   Our billing staff will not be able to assist you with questions regarding bills from these companies.  You will be contacted with the lab results as soon as they are available. The fastest way to get your results is to activate your My Chart account. Instructions are located on the last page of this paperwork. If you have not heard from us regarding the results in 2 weeks, please contact this office.       

## 2020-04-09 ENCOUNTER — Encounter: Payer: Self-pay | Admitting: Internal Medicine

## 2020-04-14 ENCOUNTER — Inpatient Hospital Stay (HOSPITAL_COMMUNITY): Payer: Medicare Other | Attending: Hematology

## 2020-04-14 DIAGNOSIS — R160 Hepatomegaly, not elsewhere classified: Secondary | ICD-10-CM | POA: Insufficient documentation

## 2020-04-14 DIAGNOSIS — N189 Chronic kidney disease, unspecified: Secondary | ICD-10-CM | POA: Insufficient documentation

## 2020-04-14 DIAGNOSIS — Z905 Acquired absence of kidney: Secondary | ICD-10-CM | POA: Insufficient documentation

## 2020-04-14 DIAGNOSIS — Z7984 Long term (current) use of oral hypoglycemic drugs: Secondary | ICD-10-CM | POA: Insufficient documentation

## 2020-04-14 DIAGNOSIS — Z8249 Family history of ischemic heart disease and other diseases of the circulatory system: Secondary | ICD-10-CM | POA: Insufficient documentation

## 2020-04-14 DIAGNOSIS — Z8673 Personal history of transient ischemic attack (TIA), and cerebral infarction without residual deficits: Secondary | ICD-10-CM | POA: Insufficient documentation

## 2020-04-14 DIAGNOSIS — Z833 Family history of diabetes mellitus: Secondary | ICD-10-CM | POA: Insufficient documentation

## 2020-04-14 DIAGNOSIS — Z7982 Long term (current) use of aspirin: Secondary | ICD-10-CM | POA: Insufficient documentation

## 2020-04-14 DIAGNOSIS — Z79899 Other long term (current) drug therapy: Secondary | ICD-10-CM | POA: Insufficient documentation

## 2020-04-14 DIAGNOSIS — Z85528 Personal history of other malignant neoplasm of kidney: Secondary | ICD-10-CM | POA: Insufficient documentation

## 2020-04-14 DIAGNOSIS — E785 Hyperlipidemia, unspecified: Secondary | ICD-10-CM | POA: Insufficient documentation

## 2020-04-14 DIAGNOSIS — Z923 Personal history of irradiation: Secondary | ICD-10-CM | POA: Insufficient documentation

## 2020-04-14 DIAGNOSIS — Z8546 Personal history of malignant neoplasm of prostate: Secondary | ICD-10-CM | POA: Insufficient documentation

## 2020-04-14 DIAGNOSIS — E119 Type 2 diabetes mellitus without complications: Secondary | ICD-10-CM | POA: Insufficient documentation

## 2020-04-20 ENCOUNTER — Inpatient Hospital Stay (HOSPITAL_COMMUNITY): Payer: Medicare Other | Attending: Hematology

## 2020-04-20 ENCOUNTER — Other Ambulatory Visit: Payer: Self-pay

## 2020-04-20 DIAGNOSIS — E785 Hyperlipidemia, unspecified: Secondary | ICD-10-CM | POA: Insufficient documentation

## 2020-04-20 DIAGNOSIS — N189 Chronic kidney disease, unspecified: Secondary | ICD-10-CM | POA: Insufficient documentation

## 2020-04-20 DIAGNOSIS — Z7982 Long term (current) use of aspirin: Secondary | ICD-10-CM | POA: Diagnosis not present

## 2020-04-20 DIAGNOSIS — D649 Anemia, unspecified: Secondary | ICD-10-CM | POA: Insufficient documentation

## 2020-04-20 DIAGNOSIS — Z833 Family history of diabetes mellitus: Secondary | ICD-10-CM | POA: Insufficient documentation

## 2020-04-20 DIAGNOSIS — R932 Abnormal findings on diagnostic imaging of liver and biliary tract: Secondary | ICD-10-CM | POA: Insufficient documentation

## 2020-04-20 DIAGNOSIS — Z905 Acquired absence of kidney: Secondary | ICD-10-CM | POA: Diagnosis not present

## 2020-04-20 DIAGNOSIS — Z85528 Personal history of other malignant neoplasm of kidney: Secondary | ICD-10-CM | POA: Diagnosis not present

## 2020-04-20 DIAGNOSIS — K769 Liver disease, unspecified: Secondary | ICD-10-CM | POA: Diagnosis not present

## 2020-04-20 DIAGNOSIS — Z79899 Other long term (current) drug therapy: Secondary | ICD-10-CM | POA: Insufficient documentation

## 2020-04-20 DIAGNOSIS — Z923 Personal history of irradiation: Secondary | ICD-10-CM | POA: Insufficient documentation

## 2020-04-20 DIAGNOSIS — K7689 Other specified diseases of liver: Secondary | ICD-10-CM

## 2020-04-20 DIAGNOSIS — Z8249 Family history of ischemic heart disease and other diseases of the circulatory system: Secondary | ICD-10-CM | POA: Insufficient documentation

## 2020-04-20 DIAGNOSIS — R160 Hepatomegaly, not elsewhere classified: Secondary | ICD-10-CM | POA: Insufficient documentation

## 2020-04-20 DIAGNOSIS — Z7984 Long term (current) use of oral hypoglycemic drugs: Secondary | ICD-10-CM | POA: Diagnosis not present

## 2020-04-20 DIAGNOSIS — Z8546 Personal history of malignant neoplasm of prostate: Secondary | ICD-10-CM | POA: Diagnosis not present

## 2020-04-20 DIAGNOSIS — E119 Type 2 diabetes mellitus without complications: Secondary | ICD-10-CM | POA: Insufficient documentation

## 2020-04-20 LAB — COMPREHENSIVE METABOLIC PANEL
ALT: 24 U/L (ref 0–44)
AST: 26 U/L (ref 15–41)
Albumin: 3.6 g/dL (ref 3.5–5.0)
Alkaline Phosphatase: 76 U/L (ref 38–126)
Anion gap: 12 (ref 5–15)
BUN: 30 mg/dL — ABNORMAL HIGH (ref 8–23)
CO2: 23 mmol/L (ref 22–32)
Calcium: 9.2 mg/dL (ref 8.9–10.3)
Chloride: 106 mmol/L (ref 98–111)
Creatinine, Ser: 1.41 mg/dL — ABNORMAL HIGH (ref 0.61–1.24)
GFR calc Af Amer: 55 mL/min — ABNORMAL LOW (ref >60)
GFR calc non Af Amer: 48 mL/min — ABNORMAL LOW (ref >60)
Glucose, Bld: 142 mg/dL — ABNORMAL HIGH (ref 70–99)
Potassium: 4.7 mmol/L (ref 3.5–5.1)
Sodium: 141 mmol/L (ref 135–145)
Total Bilirubin: 0.5 mg/dL (ref 0.3–1.2)
Total Protein: 8.1 g/dL (ref 6.5–8.1)

## 2020-04-20 LAB — CBC WITH DIFFERENTIAL/PLATELET
Abs Immature Granulocytes: 0.02 10*3/uL (ref 0.00–0.07)
Basophils Absolute: 0 10*3/uL (ref 0.0–0.1)
Basophils Relative: 1 %
Eosinophils Absolute: 0.3 10*3/uL (ref 0.0–0.5)
Eosinophils Relative: 6 %
HCT: 37.2 % — ABNORMAL LOW (ref 39.0–52.0)
Hemoglobin: 11.3 g/dL — ABNORMAL LOW (ref 13.0–17.0)
Immature Granulocytes: 0 %
Lymphocytes Relative: 22 %
Lymphs Abs: 1 10*3/uL (ref 0.7–4.0)
MCH: 26.8 pg (ref 26.0–34.0)
MCHC: 30.4 g/dL (ref 30.0–36.0)
MCV: 88.4 fL (ref 80.0–100.0)
Monocytes Absolute: 0.6 10*3/uL (ref 0.1–1.0)
Monocytes Relative: 13 %
Neutro Abs: 2.8 10*3/uL (ref 1.7–7.7)
Neutrophils Relative %: 58 %
Platelets: 231 10*3/uL (ref 150–400)
RBC: 4.21 MIL/uL — ABNORMAL LOW (ref 4.22–5.81)
RDW: 16.7 % — ABNORMAL HIGH (ref 11.5–15.5)
WBC: 4.8 10*3/uL (ref 4.0–10.5)
nRBC: 0 % (ref 0.0–0.2)

## 2020-04-20 LAB — IRON AND TIBC
Iron: 47 ug/dL (ref 45–182)
Saturation Ratios: 14 % — ABNORMAL LOW (ref 17.9–39.5)
TIBC: 345 ug/dL (ref 250–450)
UIBC: 298 ug/dL

## 2020-04-20 LAB — HEPATITIS B SURFACE ANTIGEN: Hepatitis B Surface Ag: NONREACTIVE

## 2020-04-20 LAB — VITAMIN B12: Vitamin B-12: 420 pg/mL (ref 180–914)

## 2020-04-20 LAB — HEPATITIS B CORE ANTIBODY, TOTAL: Hep B Core Total Ab: NONREACTIVE

## 2020-04-20 LAB — FOLATE: Folate: 11.6 ng/mL (ref >5.9)

## 2020-04-20 LAB — FERRITIN: Ferritin: 70 ng/mL (ref 24–336)

## 2020-04-20 LAB — HEPATITIS B SURFACE ANTIBODY,QUALITATIVE: Hep B S Ab: NONREACTIVE

## 2020-04-21 ENCOUNTER — Other Ambulatory Visit: Payer: Self-pay

## 2020-04-21 ENCOUNTER — Ambulatory Visit (HOSPITAL_COMMUNITY)
Admission: RE | Admit: 2020-04-21 | Discharge: 2020-04-21 | Disposition: A | Discharge status: Home / Self Care | Payer: Medicare Other | Source: Ambulatory Visit | Attending: Hematology | Admitting: Hematology

## 2020-04-21 ENCOUNTER — Other Ambulatory Visit (HOSPITAL_COMMUNITY): Payer: Self-pay | Admitting: *Deleted

## 2020-04-21 DIAGNOSIS — C649 Malignant neoplasm of unspecified kidney, except renal pelvis: Secondary | ICD-10-CM | POA: Diagnosis present

## 2020-04-21 DIAGNOSIS — K7689 Other specified diseases of liver: Secondary | ICD-10-CM | POA: Insufficient documentation

## 2020-04-21 DIAGNOSIS — N2889 Other specified disorders of kidney and ureter: Secondary | ICD-10-CM | POA: Insufficient documentation

## 2020-04-21 LAB — AFP TUMOR MARKER: AFP, Serum, Tumor Marker: 6.3 ng/mL (ref 0.0–8.3)

## 2020-04-21 MED ORDER — IOHEXOL 300 MG/ML  SOLN
100.0000 mL | Freq: Once | INTRAMUSCULAR | Status: AC | PRN
  Administered 2020-04-21: 12:00:00 100 mL via INTRAVENOUS

## 2020-04-22 ENCOUNTER — Other Ambulatory Visit: Payer: Self-pay | Admitting: Family Medicine

## 2020-04-23 ENCOUNTER — Other Ambulatory Visit: Payer: Self-pay

## 2020-04-23 ENCOUNTER — Encounter (HOSPITAL_COMMUNITY): Payer: Self-pay | Admitting: Hematology

## 2020-04-23 ENCOUNTER — Inpatient Hospital Stay (HOSPITAL_BASED_OUTPATIENT_CLINIC_OR_DEPARTMENT_OTHER): Payer: Medicare Other | Admitting: Hematology

## 2020-04-23 VITALS — BP 147/69 | HR 75 | Temp 97.5°F | Resp 16 | Wt 207.1 lb

## 2020-04-23 DIAGNOSIS — E119 Type 2 diabetes mellitus without complications: Secondary | ICD-10-CM | POA: Diagnosis not present

## 2020-04-23 DIAGNOSIS — Z905 Acquired absence of kidney: Secondary | ICD-10-CM | POA: Diagnosis not present

## 2020-04-23 DIAGNOSIS — Z85528 Personal history of other malignant neoplasm of kidney: Secondary | ICD-10-CM | POA: Diagnosis present

## 2020-04-23 DIAGNOSIS — C649 Malignant neoplasm of unspecified kidney, except renal pelvis: Secondary | ICD-10-CM | POA: Diagnosis not present

## 2020-04-23 DIAGNOSIS — D508 Other iron deficiency anemias: Secondary | ICD-10-CM

## 2020-04-23 DIAGNOSIS — Z7982 Long term (current) use of aspirin: Secondary | ICD-10-CM | POA: Diagnosis not present

## 2020-04-23 DIAGNOSIS — Z923 Personal history of irradiation: Secondary | ICD-10-CM | POA: Diagnosis not present

## 2020-04-23 DIAGNOSIS — R160 Hepatomegaly, not elsewhere classified: Secondary | ICD-10-CM | POA: Diagnosis not present

## 2020-04-23 DIAGNOSIS — Z79899 Other long term (current) drug therapy: Secondary | ICD-10-CM | POA: Diagnosis not present

## 2020-04-23 DIAGNOSIS — E785 Hyperlipidemia, unspecified: Secondary | ICD-10-CM | POA: Diagnosis not present

## 2020-04-23 DIAGNOSIS — Z7984 Long term (current) use of oral hypoglycemic drugs: Secondary | ICD-10-CM | POA: Diagnosis not present

## 2020-04-23 DIAGNOSIS — Z833 Family history of diabetes mellitus: Secondary | ICD-10-CM | POA: Diagnosis not present

## 2020-04-23 DIAGNOSIS — N189 Chronic kidney disease, unspecified: Secondary | ICD-10-CM | POA: Diagnosis not present

## 2020-04-23 DIAGNOSIS — Z8673 Personal history of transient ischemic attack (TIA), and cerebral infarction without residual deficits: Secondary | ICD-10-CM | POA: Diagnosis not present

## 2020-04-23 DIAGNOSIS — Z8546 Personal history of malignant neoplasm of prostate: Secondary | ICD-10-CM | POA: Diagnosis not present

## 2020-04-23 DIAGNOSIS — Z8249 Family history of ischemic heart disease and other diseases of the circulatory system: Secondary | ICD-10-CM | POA: Diagnosis not present

## 2020-04-23 NOTE — Progress Notes (Signed)
[place], [place] [address]   CLINIC:  Medical Oncology/Hematology  PCP:  Maryruth Hancock, [place] [id number] [phone]   REASON FOR VISIT:  Follow-up for normocytic anemia, liver masses   CURRENT THERAPY: Surveillance.  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 Status post left nephrectomy. No findings to suggest local recurrence of disease and no definite metastatic disease in the chest, abdomen or pelvis. 2. Densely calcified exophytic mass in the lower pole of the right kidney and 2 cm indeterminate lesion in the anterior aspect of the upper pole of the right kidney are both similar to the prior study. These are favored to be benign, but close attention on followup studies is recommended to ensure stability.     CANCER STAGING: Cancer Staging Chromophobe renal cell carcinoma (HCC) Staging form: Kidney, AJCC 8th Edition - Clinical: Stage III (cT3a, cNX, cM0) - Signed by Twana First, MD on 02/13/2017 - Pathologic: No stage assigned - Unsigned    INTERVAL HISTORY:  Mr. Ker 79 y.o. male seen for follow-up of liver masses and normocytic anemia.  Appetite is 100%.  Energy levels are 75%.  Reports hot flashes about 2-3 times per day.  He had received Lupron injection in the past.  Denies any bleeding per rectum or melena.    REVIEW OF SYSTEMS:  Review of Systems  Endocrine: Positive for hot flashes.  All other systems reviewed and are negative.    PAST MEDICAL/SURGICAL HISTORY:  Past Medical History:  Diagnosis Date  . Anemia   . Cataract   . Diverticulosis of colon   . Frequency of urination   . Glaucoma, both eyes   . Heart murmur   . History of adenomatous polyp of colon 05/16/2013  . History of transient ischemic attack (TIA) 06/19/2015   no residual  . Hot flashes   . Hyperlipidemia   . Nocturia   . Prostate cancer [facility]) urologist-  dr Alyson Ingles  oncologist -- dr Tammi Klippel   dx 11-08-20147 via bx--- Gleason 4+5,  PSA 14.1, vol 52cc/  08-16-2017 last PSA  1.3 (per dr Alyson Ingles office note)    . Renal cell carcinoma, left [facility]) oncologist-  dr Barbaraann Share zhou ([facility])/  urologist-  dr Alyson Ingles   Stage III (pT3a, pN0)  Chromophobe RCC into perinephric fat /  01-20-2017 s/p  open radical left nephrecotmy/- per [facility] oncologist note -- no evidence of disease  . Sarcoidosis    per pt year 1980--- last Chest CT 05-15-2017  . Type 2 diabetes mellitus ([place])   . Wears partial dentures    upper and lower   Past Surgical History:  Procedure Laterality Date  . COLONOSCOPY N/A 05/16/2013   Procedure: COLONOSCOPY;  Surgeon: Daneil Dolin, MD;  [facility];  Service: Endoscopy;  Laterality: N/A;  9:30  . cyst on left shoulder  yrs ago  . CYSTOSCOPY N/A 10/10/2017   Procedure: CYSTOSCOPY FLEXIBLE;  Surgeon: Cleon Gustin, MD;  Location: [facility];  Service: Urology;  Laterality: N/A;  2 seeds removed from bladder per Dr Alyson Ingles  . GOLD SEED IMPLANT N/A 10/10/2017   Procedure: GOLD SEED IMPLANT;  Surgeon: Cleon Gustin, MD;  Location: [facility];  Service: Urology;  Laterality: N/A;  3 gold seeds implanted  .  MOLE REMOVAL     lower back by dr Romona Curls  . NEPHRECTOMY Left 01/20/2017   Procedure: OPEN NEPHRECTOMY;  Surgeon: Cleon Gustin, MD;  [facility];  Service: Urology;  Laterality: Left;  . PROSTATE SURGERY    . RADIOACTIVE SEED IMPLANT N/A 10/10/2017   Procedure: RADIOACTIVE SEED IMPLANT/BRACHYTHERAPY IMPLANT;  Surgeon: Cleon Gustin, MD;  Location: [facility];  Service: Urology;  Laterality: N/A;  57 seeds implanted  . SPACE OAR INSTILLATION N/A 10/10/2017   Procedure: SPACE OAR INSTILLATION;  Surgeon: Cleon Gustin, MD;  Location:  [facility];  Service: Urology;  Laterality: N/A;  . TRANSTHORACIC ECHOCARDIOGRAM  06/20/2015   mild to moderate LVH, ef 60-65%/  trivial MR     SOCIAL HISTORY:  Social History   Socioeconomic History  . Marital status: Married    Spouse name: Not on file  . Number of children: 1  . Years of education: Not on file  . Highest education level: Not on file  Occupational History    Employer: [employer]  Tobacco Use  . Smoking status: Never Smoker  . Smokeless tobacco: Never Used  Substance and Sexual Activity  . Alcohol use: No  . Drug use: No  . Sexual activity: Not Currently  Other Topics Concern  . Not on file  Social History Narrative   Daughter deceased age 57, liver disease ?NASH 2010   Social Determinants of Health   Financial Resource Strain:   . Difficulty of Paying Living Expenses:   Food Insecurity:   . Worried About Charity fundraiser in the Last Year:   . Arboriculturist in the Last Year:   Transportation Needs:   . Film/video editor (Medical):   [name] Lack of Transportation (Non-Medical):   Physical Activity:   . Days of Exercise per Week:   . Minutes of Exercise per Session:   Stress:   . Feeling of Stress :   Social Connections:   . Frequency of Communication with Friends and Family:   . Frequency of Social Gatherings with Friends and Family:   . Attends Religious Services:   . Active Member of Clubs or Organizations:   . Attends Archivist Meetings:   [name] Marital Status:   Intimate Partner Violence:   . Fear of Current or Ex-Partner:   . Emotionally Abused:   [name] Physically Abused:   . Sexually Abused:     FAMILY HISTORY:  Family History  Problem Relation Age of Onset  . Liver disease Father        NASH? age 10  . Diabetes Father   . Hypertension Mother   . Colon cancer Neg Hx     CURRENT MEDICATIONS:  Outpatient Encounter Medications as of 04/23/2020  Medication Sig  . aspirin EC 81 MG tablet Take 81  mg by mouth every evening.  [name] atorvastatin (LIPITOR) 80 MG tablet Take 1 tablet (80 mg total) by mouth daily at 6 PM. (Patient taking differently: Take 80 mg by mouth every evening. )  . AZOPT 1 % ophthalmic suspension Place 1 drop into both eyes 2 (two) times daily.   [name] glipiZIDE (GLUCOTROL) 5 MG tablet Take 1 tablet (5 mg total) by mouth 2 (two) times daily before a meal.  . lisinopril (PRINIVIL,ZESTRIL) 2.5 MG tablet Take 1 tablet (2.5 mg total) by mouth daily. (Patient taking differently: Take 2.5 mg by mouth every evening. )   Facility-Administered Encounter Medications as  of 04/23/2020  Medication  . magnesium citrate solution 1 Bottle    ALLERGIES:  No Known Allergies   PHYSICAL EXAM:  ECOG Performance status: 1  Vitals:   04/23/20 1424  BP: (!) 147/69  Pulse: 75  Resp: 16  Temp: (!) 97.5 F (36.4 C)  SpO2: 99%   Filed Weights   04/23/20 1424  Weight: 207 lb 1.6 oz (93.9 kg)   Physical Exam Vitals reviewed.  Constitutional:      Appearance: Normal appearance.  Neurological:     General: No focal deficit present.     Mental Status: He is alert and oriented to person, place, and time.  Psychiatric:        Mood and Affect: Mood normal.        Behavior: Behavior normal.      LABORATORY DATA:  I have reviewed the labs as listed.  CBC    Component Value Date/Time   WBC 4.8 04/20/2020 1006   RBC 4.21 (L) 04/20/2020 1006   HGB 11.3 (L) 04/20/2020 1006   HCT 37.2 (L) 04/20/2020 1006   PLT 231 04/20/2020 1006   MCV 88.4 04/20/2020 1006   MCH 26.8 04/20/2020 1006   MCHC 30.4 04/20/2020 1006   RDW 16.7 (H) 04/20/2020 1006   LYMPHSABS 1.0 04/20/2020 1006   MONOABS 0.6 04/20/2020 1006   EOSABS 0.3 04/20/2020 1006   BASOSABS 0.0 04/20/2020 1006   CMP Latest Ref Rng & Units 04/20/2020 01/15/2020 05/14/2019  Glucose 70 - 99 mg/dL 142(H) 135(H) 120(H)  BUN 8 - 23 mg/dL 30(H) 26(H) 31(H)  Creatinine 0.61 - 1.24 mg/dL 1.41(H) 1.39(H) 1.44(H)  Sodium 135 - 145 mmol/L  141 138 142  Potassium 3.5 - 5.1 mmol/L 4.7 4.8 5.1  Chloride 98 - 111 mmol/L 106 103 108  CO2 22 - 32 mmol/L 23 23 22   Calcium 8.9 - 10.3 mg/dL 9.2 9.1 9.2  Total Protein 6.5 - 8.1 g/dL 8.1 8.4(H) 8.2(H)  Total Bilirubin 0.3 - 1.2 mg/dL 0.5 0.5 0.6  Alkaline Phos 38 - 126 U/L 76 74 77  AST 15 - 41 U/L 26 23 26   ALT 0 - 44 U/L 24 20 19     DIAGNOSTIC IMAGING:  I have independently reviewed the scans and discussed with the patient.  ASSESSMENT & PLAN:  Chromophobe renal cell carcinoma (HCC) 1.  Right kidney mass: -CTAP on 01/15/2020 showed stable appearance of the right mid kidney exophytic lesion with a large ossific competent, smaller fatty competent and soft tissue competent.  Probably benign compared to December 2017. 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  2.  Normocytic anemia: -We reviewed CBC 04/20/2020.  Hemoglobin is stable at 11.3.  Ferritin is 70 and percent saturation is 14.  Folic acid and 123456 are normal. 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  Will consider Feraheme if there is any drop in hemoglobin. -I plan to repeat his CBC, ferritin, iron panel in 3 months.  3.  Liver masses: -Alpha-fetoprotein is 6.3.  Hepatitis B panel was negative. -We reviewed results of the CT abdomen with and without contrast from 04/21/2020.  Segment 2 lesion is LiRADS 3.  Segment 3 lesion is no longer visible.  Lesion posteromedially in the right hepatic lobe is LiRADS3. -We will do repeat imaging in 6 months.  4.  High-grade prostate cancer: -He had prostate cancer with Gleason 4+5, PSA 14.1. -He received IMRT and seed implants from 11/07/2017 through 12/14/2017. -  He reportedly receives Lupron every 6 months until January. -He still has some hot flashes.  His testosterone levels were reportedly low.  He follows up with Dr. Alyson Ingles at [facility] urology.     Orders placed this encounter:  Orders Placed This  Encounter  Procedures  . CBC with Differential  . Iron and TIBC  . Ferritin      Derek Jack, MD [place] [phone]

## 2020-04-23 NOTE — Patient Instructions (Addendum)
[facility] at [facility] Discharge Instructions  You were seen today by Dr. Katragadda. He went over your recent lab and scan results. He will see you back in 3 months for labs and follow up.   Thank you for choosing [facility] at [facility] to provide your oncology and hematology care.  To afford each patient quality time with our provider, please arrive at least 15 minutes before your scheduled appointment time.   If you have a lab appointment with the Cancer Center please come in thru the  Main Entrance and check in at the main information desk  You need to re-schedule your appointment should you arrive 10 or more minutes late.  We strive to give you quality time with our providers, and arriving late affects you and other patients whose appointments are after yours.  Also, if you no show three or more times for appointments you may be dismissed from the clinic at the providers discretion.     Again, thank you for choosing [facility].  Our hope is that these requests will decrease the amount of time that you wait before being seen by our physicians.       _____________________________________________________________  Should you have questions after your visit to [facility], please contact our office at [phone] between the hours of 8:00 a.m. and 4:30 p.m.  Voicemails left after 4:00 p.m. will not be returned until the following business day.  For prescription refill requests, have your pharmacy contact our office and allow 72 hours.    Cancer Center Support Programs:   > Cancer Support Group  2nd Tuesday of the month 1pm-2pm, Journey Room    

## 2020-04-23 NOTE — Assessment & Plan Note (Signed)
1.  Right kidney mass: -CTAP on 01/15/2020 showed stable appearance of the right mid kidney exophytic lesion with a large ossific competent, smaller fatty competent and soft tissue competent.  Probably benign compared to December 2017. 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  2.  Normocytic anemia: -We reviewed CBC 04/20/2020.  Hemoglobin is stable at 11.3.  Ferritin is 70 and percent saturation is 14.  Folic acid and 123456 are normal. 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  Will consider Feraheme if there is any drop in hemoglobin. -I plan to repeat his CBC, ferritin, iron panel in 3 months.  3.  Liver masses: -Alpha-fetoprotein is 6.3.  Hepatitis B panel was negative. -We reviewed results of the CT abdomen with and without contrast from 04/21/2020.  Segment 2 lesion is LiRADS 3.  Segment 3 lesion is no longer visible.  Lesion posteromedially in the right hepatic lobe is LiRADS3. -We will do repeat imaging in 6 months.  4.  High-grade prostate cancer: -He had prostate cancer with Gleason 4+5, PSA 14.1. -He received IMRT and seed implants from 11/07/2017 through 12/14/2017. -He reportedly receives Lupron every 6 months until January. -He still has some hot flashes.  His testosterone levels were reportedly low.  He follows up with Dr. Alyson Ingles at [facility] urology.

## 2020-07-14 ENCOUNTER — Other Ambulatory Visit: Payer: Self-pay | Admitting: Urology

## 2020-07-15 ENCOUNTER — Other Ambulatory Visit: Payer: Self-pay

## 2020-07-15 ENCOUNTER — Ambulatory Visit (INDEPENDENT_AMBULATORY_CARE_PROVIDER_SITE_OTHER): Payer: Medicare Other | Admitting: Urology

## 2020-07-15 ENCOUNTER — Other Ambulatory Visit: Payer: Self-pay | Admitting: Urology

## 2020-07-15 ENCOUNTER — Encounter: Payer: Self-pay | Admitting: Urology

## 2020-07-15 VITALS — BP 148/72 | HR 76 | Temp 97.3°F | Ht 67.0 in | Wt 207.0 lb

## 2020-07-15 DIAGNOSIS — C61 Malignant neoplasm of prostate: Secondary | ICD-10-CM

## 2020-07-15 DIAGNOSIS — N2889 Other specified disorders of kidney and ureter: Secondary | ICD-10-CM | POA: Diagnosis not present

## 2020-07-15 LAB — COMPREHENSIVE METABOLIC PANEL
AG Ratio: 1.1 (calc) (ref 1.0–2.5)
ALT: 19 U/L (ref 9–46)
AST: 23 U/L (ref 10–35)
Albumin: 4.1 g/dL (ref 3.6–5.1)
Alkaline phosphatase (APISO): 80 U/L (ref 35–144)
BUN/Creatinine Ratio: 20 (calc) (ref 6–22)
BUN: 30 mg/dL — ABNORMAL HIGH (ref 7–25)
CO2: 22 mmol/L (ref 20–32)
Calcium: 9.3 mg/dL (ref 8.6–10.3)
Chloride: 105 mmol/L (ref 98–110)
Creat: 1.53 mg/dL — ABNORMAL HIGH (ref 0.70–1.18)
Globulin: 3.9 g/dL (calc) — ABNORMAL HIGH (ref 1.9–3.7)
Glucose, Bld: 140 mg/dL — ABNORMAL HIGH (ref 65–139)
Potassium: 4.7 mmol/L (ref 3.5–5.3)
Sodium: 138 mmol/L (ref 135–146)
Total Bilirubin: 0.4 mg/dL (ref 0.2–1.2)
Total Protein: 8 g/dL (ref 6.1–8.1)

## 2020-07-15 LAB — PSA: PSA: <0.1 ng/mL (ref <=4.0)

## 2020-07-15 NOTE — Progress Notes (Signed)
Urological Symptom Review  Patient is experiencing the following symptoms: Get up at night to urinate Erection problems (male only)   Review of Systems  Gastrointestinal (upper)  : Negative for upper GI symptoms  Gastrointestinal (lower) : None  Constitutional : Night Sweats  Skin: Negative for skin symptoms  Eyes: Negative for eye symptoms  Ear/Nose/Throat : Negative for Ear/Nose/Throat symptoms  Hematologic/Lymphatic: Negative for Hematologic/Lymphatic symptoms  Cardiovascular : Negative for cardiovascular symptoms  Respiratory : Negative for respiratory symptoms  Endocrine: Negative for endocrine symptoms  Musculoskeletal: Negative for musculoskeletal symptoms  Neurological: Negative for neurological symptoms  Psychologic: Negative for psychiatric symptoms

## 2020-07-15 NOTE — Patient Instructions (Signed)
Prostate Cancer  The prostate is a male gland that helps make semen. Prostate cancer is when abnormal cells grow in this gland. Follow these instructions at home:  Take over-the-counter and prescription medicines only as told by your doctor.  Eat a healthy diet.  Get plenty of sleep.  Ask your doctor for help to find a support group for men with prostate cancer.  Keep all follow-up visits as told by your doctor. This is important.  If you have to go to the hospital, let your cancer doctor (oncologist) know.  Touch, hold, hug, and caress your partner to continue to show sexual feelings. Contact a doctor if:  You have trouble peeing (urinating).  You have blood in your pee (urine).  You have pain in your hips, back, or chest. Get help right away if:  You have weakness in your legs.  You lose feeling (have numbness) in your legs.  You cannot control your pee or your poop (stool).  You have trouble breathing.  You have sudden pain in your chest.  You have chills or a fever. Summary  The prostate is a male gland that helps make semen. Prostate cancer is when abnormal cells grow in this gland.  Ask your doctor for help to find a support group for men with prostate cancer.  Contact a doctor if you have problems peeing or have any new pain that you did not have before. This information is not intended to replace advice given to you by your health care provider. Make sure you discuss any questions you have with your health care provider. Document Revised: 11/24/2017 Document Reviewed: 08/22/2016 Elsevier Patient Education  2020 Elsevier Inc.  

## 2020-07-15 NOTE — Progress Notes (Signed)
07/15/2020 2:00 PM   Jon Brooks. [birth year] [id number]  Referring provider: Sinda Du, MD No address on file  followup prostate cancer and RCC  HPI: Jon Brooks is a 78yo here for followup for prostate cancer and RCC s/p left nephrectomy. He is also followed by Dr. Delton Coombes at [facility]. PSa undetectable. No worsening LUTS. No hematuria. CT from 03/2020 shows no evidence of recurrence of RCC. Creatinine 1.41. CMP normal. Good energy.      PMH: Past Medical History:  Diagnosis Date  . Anemia   . Cataract   . Diverticulosis of colon   . Frequency of urination   . Glaucoma, both eyes   . Heart murmur   . History of adenomatous polyp of colon 05/16/2013  . History of transient ischemic attack (TIA) 06/19/2015   no residual  . Hot flashes   . Hyperlipidemia   . Nocturia   . Prostate cancer [facility]) urologist-  dr Alyson Ingles  oncologist -- dr Tammi Klippel   dx 11-08-20147 via bx--- Gleason 4+5,  PSA 14.1, vol 52cc/  08-16-2017 last PSA  1.3 (per dr Alyson Ingles office note)    . Renal cell carcinoma, left [facility]) oncologist-  dr Barbaraann Share zhou ([facility])/  urologist-  dr Alyson Ingles   Stage III (pT3a, pN0)  Chromophobe RCC into perinephric fat /  01-20-2017 s/p  open radical left nephrecotmy/- per [facility] oncologist note -- no evidence of disease  . Sarcoidosis    per pt year 1980--- last Chest CT 05-15-2017  . Type 2 diabetes mellitus ([place])   . Wears partial dentures    upper and lower    Surgical History: Past Surgical History:  Procedure Laterality Date  . COLONOSCOPY N/A 05/16/2013   Procedure: COLONOSCOPY;  Surgeon: Daneil Dolin, MD;  [facility];  Service: Endoscopy;  Laterality: N/A;  9:30  . cyst on left shoulder  yrs ago  . CYSTOSCOPY N/A 10/10/2017   Procedure: CYSTOSCOPY FLEXIBLE;  Surgeon: Cleon Gustin, MD;  Location: [facility];  Service: Urology;  Laterality: N/A;  2 seeds removed from bladder per Dr Alyson Ingles  . GOLD  SEED IMPLANT N/A 10/10/2017   Procedure: GOLD SEED IMPLANT;  Surgeon: Cleon Gustin, MD;  Location: [facility];  Service: Urology;  Laterality: N/A;  3 gold seeds implanted  . MOLE REMOVAL     lower back by dr Romona Curls  . NEPHRECTOMY Left 01/20/2017   Procedure: OPEN NEPHRECTOMY;  Surgeon: Cleon Gustin, MD;  [facility];  Service: Urology;  Laterality: Left;  . PROSTATE SURGERY    . RADIOACTIVE SEED IMPLANT N/A 10/10/2017   Procedure: RADIOACTIVE SEED IMPLANT/BRACHYTHERAPY IMPLANT;  Surgeon: Cleon Gustin, MD;  Location: [facility];  Service: Urology;  Laterality: N/A;  57 seeds implanted  . SPACE OAR INSTILLATION N/A 10/10/2017   Procedure: SPACE OAR INSTILLATION;  Surgeon: Cleon Gustin, MD;  Location: [facility];  Service: Urology;  Laterality: N/A;  . TRANSTHORACIC ECHOCARDIOGRAM  06/20/2015   mild to moderate LVH, ef 60-65%/  trivial Jon    Home Medications:  Allergies as of 07/15/2020   No Known Allergies     Medication List       Accurate as of July 15, 2020  2:00 PM. If you have any questions, ask your nurse or doctor.        aspirin EC 81 MG tablet Take 81 mg by mouth every evening.   atorvastatin  80 MG tablet Commonly known as: LIPITOR Take 1 tablet (80 mg total) by mouth daily at 6 PM. What changed: when to take this   Azopt 1 % ophthalmic suspension Generic drug: brinzolamide Place 1 drop into both eyes 2 (two) times daily.   glipiZIDE 5 MG tablet Commonly known as: GLUCOTROL Take 1 tablet (5 mg total) by mouth 2 (two) times daily before a meal.   lisinopril 2.5 MG tablet Commonly known as: ZESTRIL Take 1 tablet (2.5 mg total) by mouth daily. What changed: when to take this       Allergies: No Known Allergies  Family History: Family History  Problem Relation Age of Onset  . Liver disease Father        NASH? age 52  . Diabetes Father   . Hypertension Mother   . Colon cancer  Neg Hx     Social History:  reports that he has never smoked. He has never used smokeless tobacco. He reports that he does not drink alcohol and does not use drugs.  ROS: All other review of systems were reviewed and are negative except what is noted above in HPI  Physical Exam: BP (!) 148/72   Pulse 76   Temp (!) 97.3 F (36.3 C)   Ht 5\' 7"  (1.702 m)   Wt 207 lb (93.9 kg)   BMI 32.42 kg/m   Constitutional:  Alert and oriented, No acute distress. HEENT: [place] AT, moist mucus membranes.  Trachea midline, no masses. Cardiovascular: No clubbing, cyanosis, or edema. Respiratory: Normal respiratory effort, no increased work of breathing. GI: Abdomen is soft, nontender, nondistended, no abdominal masses GU: No CVA tenderness.  Lymph: No cervical or inguinal lymphadenopathy. Skin: No rashes, bruises or suspicious lesions. Neurologic: Grossly intact, no focal deficits, moving all 4 extremities. Psychiatric: Normal mood and affect.  Laboratory Data: Lab Results  Component Value Date   WBC 4.8 04/20/2020   HGB 11.3 (L) 04/20/2020   HCT 37.2 (L) 04/20/2020   MCV 88.4 04/20/2020   PLT 231 04/20/2020    Lab Results  Component Value Date   CREATININE 1.41 (H) 04/20/2020    Lab Results  Component Value Date   PSA <0.1 01/08/2020   PSA 25.28 (H) 12/22/2016    No results found for: TESTOSTERONE  Lab Results  Component Value Date   HGBA1C 6.7 (A) 01/30/2020    Urinalysis    Component Value Date/Time   COLORURINE YELLOW 06/19/2015 1819   APPEARANCEUR CLEAR 06/19/2015 1819   LABSPEC 1.025 06/19/2015 1819   PHURINE 5.5 06/19/2015 1819   GLUCOSEU 100 (A) 06/19/2015 1819   HGBUR TRACE (A) 06/19/2015 1819   BILIRUBINUR NEGATIVE 06/19/2015 1819   KETONESUR NEGATIVE 06/19/2015 1819   PROTEINUR NEGATIVE 06/19/2015 1819   UROBILINOGEN 0.2 06/19/2015 1819   NITRITE POSITIVE (A) 06/19/2015 1819   LEUKOCYTESUR MODERATE (A) 06/19/2015 1819    Lab Results  Component Value Date     BACTERIA MANY (A) 06/19/2015    Pertinent Imaging: CT abd/pelvis 04/21/2020: Images reviewed and discussed with the patient No results found for this or any previous visit.  No results found for this or any previous visit.  No results found for this or any previous visit.  No results found for this or any previous visit.  No results found for this or any previous visit.  No results found for this or any previous visit.  No results found for this or any previous visit.  No results found for this or  any previous visit.   Assessment & Plan:    1. Prostate cancer ([place]) -RTC 6 months with PSA  2. Renal mass RTC 6 months with CXR and CMP   No follow-ups on file.  Nicolette Bang, MD  [facility] [place]

## 2020-07-30 ENCOUNTER — Other Ambulatory Visit: Payer: Self-pay

## 2020-07-30 ENCOUNTER — Inpatient Hospital Stay (HOSPITAL_COMMUNITY): Payer: Medicare Other | Attending: Hematology

## 2020-07-30 DIAGNOSIS — Z7982 Long term (current) use of aspirin: Secondary | ICD-10-CM | POA: Diagnosis not present

## 2020-07-30 DIAGNOSIS — Z79899 Other long term (current) drug therapy: Secondary | ICD-10-CM | POA: Diagnosis not present

## 2020-07-30 DIAGNOSIS — R232 Flushing: Secondary | ICD-10-CM | POA: Insufficient documentation

## 2020-07-30 DIAGNOSIS — Z85528 Personal history of other malignant neoplasm of kidney: Secondary | ICD-10-CM | POA: Diagnosis not present

## 2020-07-30 DIAGNOSIS — Z7984 Long term (current) use of oral hypoglycemic drugs: Secondary | ICD-10-CM | POA: Diagnosis not present

## 2020-07-30 DIAGNOSIS — N2889 Other specified disorders of kidney and ureter: Secondary | ICD-10-CM | POA: Diagnosis present

## 2020-07-30 DIAGNOSIS — Z8249 Family history of ischemic heart disease and other diseases of the circulatory system: Secondary | ICD-10-CM | POA: Insufficient documentation

## 2020-07-30 DIAGNOSIS — E119 Type 2 diabetes mellitus without complications: Secondary | ICD-10-CM | POA: Diagnosis not present

## 2020-07-30 DIAGNOSIS — K7689 Other specified diseases of liver: Secondary | ICD-10-CM | POA: Insufficient documentation

## 2020-07-30 DIAGNOSIS — Z905 Acquired absence of kidney: Secondary | ICD-10-CM | POA: Insufficient documentation

## 2020-07-30 DIAGNOSIS — Z8546 Personal history of malignant neoplasm of prostate: Secondary | ICD-10-CM | POA: Insufficient documentation

## 2020-07-30 DIAGNOSIS — Z833 Family history of diabetes mellitus: Secondary | ICD-10-CM | POA: Insufficient documentation

## 2020-07-30 DIAGNOSIS — Z8673 Personal history of transient ischemic attack (TIA), and cerebral infarction without residual deficits: Secondary | ICD-10-CM | POA: Diagnosis not present

## 2020-07-30 DIAGNOSIS — D631 Anemia in chronic kidney disease: Secondary | ICD-10-CM | POA: Diagnosis not present

## 2020-07-30 DIAGNOSIS — N189 Chronic kidney disease, unspecified: Secondary | ICD-10-CM | POA: Diagnosis not present

## 2020-07-30 DIAGNOSIS — D508 Other iron deficiency anemias: Secondary | ICD-10-CM

## 2020-07-30 LAB — CBC WITH DIFFERENTIAL/PLATELET
Abs Immature Granulocytes: 0.01 10*3/uL (ref 0.00–0.07)
Basophils Absolute: 0 10*3/uL (ref 0.0–0.1)
Basophils Relative: 1 %
Eosinophils Absolute: 0.2 10*3/uL (ref 0.0–0.5)
Eosinophils Relative: 4 %
HCT: 38.7 % — ABNORMAL LOW (ref 39.0–52.0)
Hemoglobin: 11.4 g/dL — ABNORMAL LOW (ref 13.0–17.0)
Immature Granulocytes: 0 %
Lymphocytes Relative: 24 %
Lymphs Abs: 1.1 10*3/uL (ref 0.7–4.0)
MCH: 26.3 pg (ref 26.0–34.0)
MCHC: 29.5 g/dL — ABNORMAL LOW (ref 30.0–36.0)
MCV: 89.2 fL (ref 80.0–100.0)
Monocytes Absolute: 0.6 10*3/uL (ref 0.1–1.0)
Monocytes Relative: 13 %
Neutro Abs: 2.6 10*3/uL (ref 1.7–7.7)
Neutrophils Relative %: 58 %
Platelets: 212 10*3/uL (ref 150–400)
RBC: 4.34 MIL/uL (ref 4.22–5.81)
RDW: 17.5 % — ABNORMAL HIGH (ref 11.5–15.5)
WBC: 4.4 10*3/uL (ref 4.0–10.5)
nRBC: 0 % (ref 0.0–0.2)

## 2020-07-30 LAB — IRON AND TIBC
Iron: 55 ug/dL (ref 45–182)
Saturation Ratios: 16 % — ABNORMAL LOW (ref 17.9–39.5)
TIBC: 342 ug/dL (ref 250–450)
UIBC: 287 ug/dL

## 2020-07-30 LAB — FERRITIN: Ferritin: 65 ng/mL (ref 24–336)

## 2020-08-06 ENCOUNTER — Inpatient Hospital Stay (HOSPITAL_BASED_OUTPATIENT_CLINIC_OR_DEPARTMENT_OTHER): Payer: Medicare Other | Admitting: Hematology

## 2020-08-06 ENCOUNTER — Other Ambulatory Visit: Payer: Self-pay

## 2020-08-06 VITALS — BP 136/64 | HR 81 | Temp 97.1°F | Resp 18 | Wt 209.6 lb

## 2020-08-06 DIAGNOSIS — D508 Other iron deficiency anemias: Secondary | ICD-10-CM

## 2020-08-06 DIAGNOSIS — K7689 Other specified diseases of liver: Secondary | ICD-10-CM

## 2020-08-06 NOTE — Patient Instructions (Signed)
Jon Brooks at [facility] Discharge Instructions  You were seen today by Dr. Delton Coombes. He went over your recent results. Purchase ferrous sulfate tablets over the counter and take 1 tablet (325 mg) daily; purchase stool softener over the counter and take with iron to prevent constipation. You will be scheduled for a CT scan of your abdomen. Dr. Delton Coombes will see you back in 3 months for labs and follow up.   Thank you for choosing [place] at [facility] to provide your oncology and hematology care.  To afford each patient quality time with our provider, please arrive at least 15 minutes before your scheduled appointment time.   If you have a lab appointment with the [facility] please come in thru the Main Entrance and check in at the main information desk  You need to re-schedule your appointment should you arrive 10 or more minutes late.  We strive to give you quality time with our providers, and arriving late affects you and other patients whose appointments are after yours.  Also, if you no show three or more times for appointments you may be dismissed from the clinic at the providers discretion.     Again, thank you for choosing [facility].  Our hope is that these requests will decrease the amount of time that you wait before being seen by our physicians.       _____________________________________________________________  Should you have questions after your visit to [facility], please contact our office at [phone] between the hours of 8:00 a.m. and 4:30 p.m.  Voicemails left after 4:00 p.m. will not be returned until the following business day.  For prescription refill requests, have your pharmacy contact our office and allow 72 hours.    Cancer Center Support Programs:   > Cancer Support Group  2nd Tuesday of the month 1pm-2pm, Journey Room

## 2020-08-06 NOTE — Progress Notes (Signed)
[place], [place] [address]   CLINIC:  Medical Oncology/Hematology  PCP:  Maryruth Hancock, MD [address] / [place] [address]  [phone]  REASON FOR VISIT:  Follow-up for normocytic anemia and liver masses  PRIOR THERAPY: None  CURRENT THERAPY: Surveillance  INTERVAL HISTORY:  [name]., a 79 y.o. male, returns for routine follow-up for his normocytic anemia and liver masses. Jon Brooks was last seen on 04/23/2020.  Today he reports that he went to Dr. Alyson Ingles on 7/21 for a check-up. He continues having hot flashes. He has started taking fish oil daily. He denies hematuria. His appetite is good.   REVIEW OF SYSTEMS:  Review of Systems  Constitutional: Positive for fatigue (mild). Negative for appetite change.  Endocrine: Positive for hot flashes.  Genitourinary: Negative for hematuria.   All other systems reviewed and are negative.   PAST MEDICAL/SURGICAL HISTORY:  Past Medical History:  Diagnosis Date  . Anemia   . Cataract   . Diverticulosis of colon   . Frequency of urination   . Glaucoma, both eyes   . Heart murmur   . History of adenomatous polyp of colon 05/16/2013  . History of transient ischemic attack (TIA) 06/19/2015   no residual  . Hot flashes   . Hyperlipidemia   . Nocturia   . Prostate cancer [facility]) urologist-  dr Alyson Ingles  oncologist -- dr Tammi Klippel   dx [id number] via bx--- Gleason 4+5,  PSA 14.1, vol 52cc/  08-16-2017 last PSA  1.3 (per dr Alyson Ingles office note)    . Renal cell carcinoma, left [facility]) oncologist-  dr Barbaraann Share zhou ([facility])/  urologist-  dr Alyson Ingles   Stage III (pT3a, pN0)  Chromophobe RCC into perinephric fat /  01-20-2017 s/p  open radical left nephrecotmy/- per [facility] oncologist note -- no evidence of disease  . Sarcoidosis    per pt year 1980--- last Chest CT 05-15-2017  . Type 2 diabetes mellitus ([place])   . Wears partial dentures    upper and lower   Past Surgical  History:  Procedure Laterality Date  . COLONOSCOPY N/A 05/16/2013   Procedure: COLONOSCOPY;  Surgeon: Daneil Dolin, MD;  [facility];  Service: Endoscopy;  Laterality: N/A;  9:30  . cyst on left shoulder  yrs ago  . CYSTOSCOPY N/A 10/10/2017   Procedure: CYSTOSCOPY FLEXIBLE;  Surgeon: Cleon Gustin, MD;  Location: [facility];  Service: Urology;  Laterality: N/A;  2 seeds removed from bladder per Dr Alyson Ingles  . GOLD SEED IMPLANT N/A 10/10/2017   Procedure: GOLD SEED IMPLANT;  Surgeon: Cleon Gustin, MD;  Location: [facility];  Service: Urology;  Laterality: N/A;  3 gold seeds implanted  . MOLE REMOVAL     lower back by dr Romona Curls  . NEPHRECTOMY Left 01/20/2017   Procedure: OPEN NEPHRECTOMY;  Surgeon: Cleon Gustin, MD;  Location: [facility];  Service: Urology;  Laterality: Left;  . PROSTATE SURGERY    . RADIOACTIVE SEED IMPLANT N/A 10/10/2017   Procedure: RADIOACTIVE SEED IMPLANT/BRACHYTHERAPY IMPLANT;  Surgeon: Cleon Gustin, MD;  Location: [facility];  Service: Urology;  Laterality: N/A;  57 seeds implanted  . SPACE OAR INSTILLATION N/A 10/10/2017   Procedure: SPACE OAR INSTILLATION;  Surgeon: Cleon Gustin, MD;  Location: [facility];  Service: Urology;  Laterality: N/A;  . TRANSTHORACIC ECHOCARDIOGRAM  06/20/2015   mild to  moderate LVH, ef 60-65%/  trivial MR    SOCIAL HISTORY:  Social History   Socioeconomic History  . Marital status: Married    Spouse name: Not on file  . Number of children: 1  . Years of education: Not on file  . Highest education level: Not on file  Occupational History    Employer: [employer]  Tobacco Use  . Smoking status: Never Smoker  . Smokeless tobacco: Never Used  Vaping Use  . Vaping Use: Never used  Substance and Sexual Activity  . Alcohol use: No  . Drug use: No  . Sexual activity: Not Currently  Other Topics Concern  . Not on file    Social History Narrative   Daughter deceased age 56, liver disease ?NASH 2010   Social Determinants of Health   Financial Resource Strain:   . Difficulty of Paying Living Expenses:   Food Insecurity:   . Worried About Charity fundraiser in the Last Year:   . Arboriculturist in the Last Year:   Transportation Needs:   . Film/video editor (Medical):   Jon Brooks Lack of Transportation (Non-Medical):   Physical Activity:   . Days of Exercise per Week:   . Minutes of Exercise per Session:   Stress:   . Feeling of Stress :   Social Connections:   . Frequency of Communication with Friends and Family:   . Frequency of Social Gatherings with Friends and Family:   . Attends Religious Services:   . Active Member of Clubs or Organizations:   . Attends Archivist Meetings:   Jon Brooks Marital Status:   Intimate Partner Violence:   . Fear of Current or Ex-Partner:   . Emotionally Abused:   Jon Brooks Physically Abused:   . Sexually Abused:     FAMILY HISTORY:  Family History  Problem Relation Age of Onset  . Liver disease Father        NASH? age 24  . Diabetes Father   . Hypertension Mother   . Colon cancer Neg Hx     CURRENT MEDICATIONS:  Current Outpatient Medications  Medication Sig Dispense Refill  . aspirin EC 81 MG tablet Take 81 mg by mouth every evening.    Jon Brooks atorvastatin (LIPITOR) 80 MG tablet Take 1 tablet (80 mg total) by mouth daily at 6 PM. (Patient taking differently: Take 80 mg by mouth every evening. ) 30 tablet 12  . AZOPT 1 % ophthalmic suspension Place 1 drop into both eyes 2 (two) times daily.     Jon Brooks glipiZIDE (GLUCOTROL) 5 MG tablet Take 1 tablet (5 mg total) by mouth 2 (two) times daily before a meal. 60 tablet 3  . lisinopril (PRINIVIL,ZESTRIL) 2.5 MG tablet Take 1 tablet (2.5 mg total) by mouth daily. (Patient taking differently: Take 2.5 mg by mouth every evening. ) 30 tablet 12   No current facility-administered medications for this visit.    Facility-Administered Medications Ordered in Other Visits  Medication Dose Route Frequency Provider Last Rate Last Admin  . magnesium citrate solution 1 Bottle  1 Bottle Oral Once McKenzie, Candee Furbish, MD        ALLERGIES:  No Known Allergies  PHYSICAL EXAM:  Performance status (ECOG): 1 - Symptomatic but completely ambulatory  Vitals:   08/06/20 1515  BP: 136/64  Pulse: 81  Resp: 18  Temp: (!) 97.1 F (36.2 C)  SpO2: 98%   Wt Readings from Last 3 Encounters:  08/06/20 209 lb  9.6 oz (95.1 kg)  07/15/20 207 lb (93.9 kg)  04/23/20 207 lb 1.6 oz (93.9 kg)   Physical Exam Vitals reviewed.  Constitutional:      Appearance: Normal appearance. He is obese.  Cardiovascular:     Rate and Rhythm: Normal rate and regular rhythm.     Pulses: Normal pulses.     Heart sounds: Normal heart sounds.  Pulmonary:     Effort: Pulmonary effort is normal.     Breath sounds: Normal breath sounds.  Musculoskeletal:     Right lower leg: No edema.     Left lower leg: No edema.  Neurological:     General: No focal deficit present.     Mental Status: He is alert and oriented to person, place, and time.  Psychiatric:        Mood and Affect: Mood normal.        Behavior: Behavior normal.     LABORATORY DATA:  I have reviewed the labs as listed.  CBC Latest Ref Rng & Units 07/30/2020 04/20/2020 01/15/2020  WBC 4.0 - 10.5 K/uL 4.4 4.8 5.4  Hemoglobin 13.0 - 17.0 g/dL 11.4(L) 11.3(L) 11.3(L)  Hematocrit 39 - 52 % 38.7(L) 37.2(L) 37.5(L)  Platelets 150 - 400 K/uL 212 231 239   CMP Latest Ref Rng & Units 07/14/2020 04/20/2020 01/15/2020  Glucose 65 - 139 mg/dL 140(H) 142(H) 135(H)  BUN 7 - 25 mg/dL 30(H) 30(H) 26(H)  Creatinine 0.70 - 1.18 mg/dL 1.53(H) 1.41(H) 1.39(H)  Sodium 135 - 146 mmol/L 138 141 138  Potassium 3.5 - 5.3 mmol/L 4.7 4.7 4.8  Chloride 98 - 110 mmol/L 105 106 103  CO2 20 - 32 mmol/L 22 23 23   Calcium 8.6 - 10.3 mg/dL 9.3 9.2 9.1  Total Protein 6.1 - 8.1 g/dL 8.0 8.1  8.4(H)  Total Bilirubin 0.2 - 1.2 mg/dL 0.4 0.5 0.5  Alkaline Phos 38 - 126 U/L - 76 74  AST 10 - 35 U/L 23 26 23   ALT 9 - 46 U/L 19 24 20       Component Value Date/Time   RBC 4.34 07/30/2020 1330   MCV 89.2 07/30/2020 1330   MCH 26.3 07/30/2020 1330   MCHC 29.5 (L) 07/30/2020 1330   RDW 17.5 (H) 07/30/2020 1330   LYMPHSABS 1.1 07/30/2020 1330   MONOABS 0.6 07/30/2020 1330   EOSABS 0.2 07/30/2020 1330   BASOSABS 0.0 07/30/2020 1330   Lab Results  Component Value Date   TIBC 342 07/30/2020   TIBC 345 04/20/2020   FERRITIN 65 07/30/2020   FERRITIN 70 04/20/2020   FERRITIN 100 11/09/2018   IRONPCTSAT 16 (L) 07/30/2020   IRONPCTSAT 14 (L) 04/20/2020    DIAGNOSTIC IMAGING:  I have independently reviewed the scans and discussed with the patient. No results found.   ASSESSMENT:  1.  Right kidney mass: -CTAP on 01/15/2020 showed stable appearance of the right mid kidney exophytic lesion with a large ossific competent, smaller fatty competent and soft tissue competent.  Probably benign compared to December 2017. 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  2.  Normocytic anemia: -We reviewed CBC 04/20/2020.  Hemoglobin is stable at 11.3.  Ferritin is 70 and percent saturation is 14.  Folic acid and R67 are normal. 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  Will consider Feraheme if there is any drop in hemoglobin.  3.  Liver masses: -Alpha-fetoprotein is 6.3.  Hepatitis B panel was negative. -We  reviewed results of the CT abdomen with and without contrast from 04/21/2020.  Segment 2 lesion is LiRADS 3.  Segment 3 lesion is no longer visible.  Lesion posteromedially in the right hepatic lobe is LiRADS3. -We will do repeat imaging in 6 months.  4.  High-grade prostate cancer: -He had prostate cancer with Gleason 4+5, PSA 14.1. -He received IMRT and seed implants from 11/07/2017 through 12/14/2017. -He  reportedly receives Lupron every 6 months until January. -He still has some hot flashes.  His testosterone levels were reportedly low.  He follows up with Dr. Alyson [facility]les at [facility] urology.   PLAN:  1.  Normocytic anemia: -CBC from 07/30/2020 shows hemoglobin 11.4.  Percent saturation is 16 and ferritin is 65. -RTC 3 months with repeat labs.  2.  Liver masses: -He does not report any weight changes.  Recent LFTs on 07/14/2020 normal. -I have recommended CT of the abdomen with and without contrast prior to next visit in 3 months.  3.  High-grade prostate cancer: -Last PSA was less than 0.1 on 07/14/2020.   Orders placed this encounter:  No orders of the defined types were placed in this encounter.    Derek Jack, MD [place] [phone]   I, Milinda Antis, am acting as a scribe for Dr. Sanda Linger.  I, Derek Jack MD, have reviewed the above documentation for accuracy and completeness, and I agree with the above.

## 2020-11-06 ENCOUNTER — Ambulatory Visit (HOSPITAL_COMMUNITY)
Admission: RE | Admit: 2020-11-06 | Discharge: 2020-11-06 | Disposition: A | Discharge status: Home / Self Care | Payer: Medicare Other | Source: Ambulatory Visit | Attending: Hematology | Admitting: Hematology

## 2020-11-06 ENCOUNTER — Inpatient Hospital Stay (HOSPITAL_COMMUNITY): Payer: Medicare Other | Attending: Hematology

## 2020-11-06 ENCOUNTER — Other Ambulatory Visit: Payer: Self-pay

## 2020-11-06 DIAGNOSIS — Z8546 Personal history of malignant neoplasm of prostate: Secondary | ICD-10-CM | POA: Diagnosis not present

## 2020-11-06 DIAGNOSIS — E119 Type 2 diabetes mellitus without complications: Secondary | ICD-10-CM | POA: Diagnosis not present

## 2020-11-06 DIAGNOSIS — D508 Other iron deficiency anemias: Secondary | ICD-10-CM

## 2020-11-06 DIAGNOSIS — Z8249 Family history of ischemic heart disease and other diseases of the circulatory system: Secondary | ICD-10-CM | POA: Insufficient documentation

## 2020-11-06 DIAGNOSIS — K7689 Other specified diseases of liver: Secondary | ICD-10-CM | POA: Insufficient documentation

## 2020-11-06 DIAGNOSIS — Z833 Family history of diabetes mellitus: Secondary | ICD-10-CM | POA: Diagnosis not present

## 2020-11-06 DIAGNOSIS — E785 Hyperlipidemia, unspecified: Secondary | ICD-10-CM | POA: Diagnosis not present

## 2020-11-06 DIAGNOSIS — Z7984 Long term (current) use of oral hypoglycemic drugs: Secondary | ICD-10-CM | POA: Insufficient documentation

## 2020-11-06 DIAGNOSIS — Z8673 Personal history of transient ischemic attack (TIA), and cerebral infarction without residual deficits: Secondary | ICD-10-CM | POA: Diagnosis not present

## 2020-11-06 DIAGNOSIS — Z905 Acquired absence of kidney: Secondary | ICD-10-CM | POA: Insufficient documentation

## 2020-11-06 DIAGNOSIS — Z79899 Other long term (current) drug therapy: Secondary | ICD-10-CM | POA: Insufficient documentation

## 2020-11-06 DIAGNOSIS — D509 Iron deficiency anemia, unspecified: Secondary | ICD-10-CM | POA: Insufficient documentation

## 2020-11-06 DIAGNOSIS — Z85528 Personal history of other malignant neoplasm of kidney: Secondary | ICD-10-CM | POA: Insufficient documentation

## 2020-11-06 DIAGNOSIS — Z7982 Long term (current) use of aspirin: Secondary | ICD-10-CM | POA: Diagnosis not present

## 2020-11-06 DIAGNOSIS — D631 Anemia in chronic kidney disease: Secondary | ICD-10-CM | POA: Insufficient documentation

## 2020-11-06 DIAGNOSIS — N189 Chronic kidney disease, unspecified: Secondary | ICD-10-CM | POA: Insufficient documentation

## 2020-11-06 LAB — CBC WITH DIFFERENTIAL/PLATELET
Abs Immature Granulocytes: 0.04 10*3/uL (ref 0.00–0.07)
Basophils Absolute: 0 10*3/uL (ref 0.0–0.1)
Basophils Relative: 1 %
Eosinophils Absolute: 0.1 10*3/uL (ref 0.0–0.5)
Eosinophils Relative: 2 %
HCT: 36.5 % — ABNORMAL LOW (ref 39.0–52.0)
Hemoglobin: 11.1 g/dL — ABNORMAL LOW (ref 13.0–17.0)
Immature Granulocytes: 1 %
Lymphocytes Relative: 23 %
Lymphs Abs: 1.3 10*3/uL (ref 0.7–4.0)
MCH: 27.5 pg (ref 26.0–34.0)
MCHC: 30.4 g/dL (ref 30.0–36.0)
MCV: 90.3 fL (ref 80.0–100.0)
Monocytes Absolute: 0.7 10*3/uL (ref 0.1–1.0)
Monocytes Relative: 12 %
Neutro Abs: 3.6 10*3/uL (ref 1.7–7.7)
Neutrophils Relative %: 61 %
Platelets: 209 10*3/uL (ref 150–400)
RBC: 4.04 MIL/uL — ABNORMAL LOW (ref 4.22–5.81)
RDW: 17.5 % — ABNORMAL HIGH (ref 11.5–15.5)
WBC: 5.8 10*3/uL (ref 4.0–10.5)
nRBC: 0 % (ref 0.0–0.2)

## 2020-11-06 LAB — COMPREHENSIVE METABOLIC PANEL
ALT: 24 U/L (ref 0–44)
AST: 27 U/L (ref 15–41)
Albumin: 3.7 g/dL (ref 3.5–5.0)
Alkaline Phosphatase: 74 U/L (ref 38–126)
Anion gap: 11 (ref 5–15)
BUN: 22 mg/dL (ref 8–23)
CO2: 22 mmol/L (ref 22–32)
Calcium: 9 mg/dL (ref 8.9–10.3)
Chloride: 108 mmol/L (ref 98–111)
Creatinine, Ser: 1.48 mg/dL — ABNORMAL HIGH (ref 0.61–1.24)
GFR, Estimated: 48 mL/min — ABNORMAL LOW (ref >60)
Glucose, Bld: 106 mg/dL — ABNORMAL HIGH (ref 70–99)
Potassium: 4.6 mmol/L (ref 3.5–5.1)
Sodium: 141 mmol/L (ref 135–145)
Total Bilirubin: 0.8 mg/dL (ref 0.3–1.2)
Total Protein: 8.1 g/dL (ref 6.5–8.1)

## 2020-11-06 LAB — IRON AND TIBC
Iron: 52 ug/dL (ref 45–182)
Saturation Ratios: 16 % — ABNORMAL LOW (ref 17.9–39.5)
TIBC: 324 ug/dL (ref 250–450)
UIBC: 272 ug/dL

## 2020-11-06 LAB — FERRITIN: Ferritin: 127 ng/mL (ref 24–336)

## 2020-11-06 MED ORDER — IOHEXOL 300 MG/ML  SOLN
100.0000 mL | Freq: Once | INTRAMUSCULAR | Status: AC | PRN
  Administered 2020-11-06: 100 mL via INTRAVENOUS

## 2020-11-11 ENCOUNTER — Inpatient Hospital Stay (HOSPITAL_BASED_OUTPATIENT_CLINIC_OR_DEPARTMENT_OTHER): Payer: Medicare Other | Admitting: Hematology

## 2020-11-11 ENCOUNTER — Other Ambulatory Visit: Payer: Self-pay

## 2020-11-11 VITALS — BP 141/68 | HR 73 | Temp 96.8°F | Resp 18 | Wt 207.2 lb

## 2020-11-11 DIAGNOSIS — C787 Secondary malignant neoplasm of liver and intrahepatic bile duct: Secondary | ICD-10-CM

## 2020-11-11 DIAGNOSIS — D508 Other iron deficiency anemias: Secondary | ICD-10-CM | POA: Diagnosis not present

## 2020-11-11 DIAGNOSIS — K7689 Other specified diseases of liver: Secondary | ICD-10-CM

## 2020-11-11 DIAGNOSIS — D509 Iron deficiency anemia, unspecified: Secondary | ICD-10-CM | POA: Diagnosis not present

## 2020-11-11 NOTE — Patient Instructions (Signed)
[facility] Discharge Instructions  You were seen today by Dr. Delton Coombes. He went over your recent results and scans. You will be scheduled for an MRI of your abdomen before your next visit. Dr. Delton Coombes will see you back in 6 months for labs and follow up.   Thank you for choosing [place] [facility] to provide your oncology and hematology care.  To afford each patient quality time with our provider, please arrive at least 15 minutes before your scheduled appointment time.   If you have a lab appointment with the [facility] please come in thru the Main Entrance and check in at the main information desk  You need to re-schedule your appointment should you arrive 10 or more minutes late.  We strive to give you quality time with our providers, and arriving late affects you and other patients whose appointments are after yours.  Also, if you no show three or more times for appointments you may be dismissed from the clinic at the providers discretion.     Again, thank you for choosing [facility].  Our hope is that these requests will decrease the amount of time that you wait before being seen by our physicians.       _____________________________________________________________  Should you have questions after your visit to [facility], please contact our office at [phone] between the hours of 8:00 a.m. and 4:30 p.m.  Voicemails left after 4:00 p.m. will not be returned until the following business day.  For prescription refill requests, have your pharmacy contact our office and allow 72 hours.    Cancer Center Support Programs:   > Cancer Support Group  2nd Tuesday of the month 1pm-2pm, Journey Room

## 2020-11-11 NOTE — Progress Notes (Signed)
[place], [place] [address]   CLINIC:  Medical Oncology/Hematology  PCP:  Noreene Larsson, NP [address]  [address] / [place] [address]  [phone]  REASON FOR VISIT:  Follow-up for normocytic anemia and liver masses  PRIOR THERAPY: None  CURRENT THERAPY: Iron tablets QD  INTERVAL HISTORY:  Mr. Brach Birdsall., a 79 y.o. male, returns for routine follow-up for his normocytic anemia and liver masses. Brinden was last seen on 08/06/2020.  Today he reports feeling well. He denies having nosebleeds, hematochezia or hematuria, though he reports having constipation depending on what he eats. He is taking iron tablets daily and takes a stool softener. He continues having hot flashes despite stopping Lupron a year ago.   Dr. Alyson Ingles continues following his prostate cancer.   REVIEW OF SYSTEMS:  Review of Systems  Constitutional: Positive for fatigue (75%). Negative for appetite change.  Gastrointestinal: Positive for constipation (occasional).  Endocrine: Positive for hot flashes.  All other systems reviewed and are negative.   PAST MEDICAL/SURGICAL HISTORY:  Past Medical History:  Diagnosis Date  . Anemia   . Cataract   . Diverticulosis of colon   . Frequency of urination   . Glaucoma, both eyes   . Heart murmur   . History of adenomatous polyp of colon 05/16/2013  . History of transient ischemic attack (TIA) 06/19/2015   no residual  . Hot flashes   . Hyperlipidemia   . Nocturia   . Prostate cancer [facility]) urologist-  dr Alyson Ingles  oncologist -- dr Tammi Klippel   dx 11-08-20147 via bx--- Gleason 4+5,  PSA 14.1, vol 52cc/  08-16-2017 last PSA  1.3 (per dr Alyson Ingles office note)    . Renal cell carcinoma, left [facility]) oncologist-  dr Barbaraann Share zhou ([facility])/  urologist-  dr Alyson Ingles   Stage III (pT3a, pN0)  Chromophobe RCC into perinephric fat /  01-20-2017 s/p  open radical left nephrecotmy/- per [facility] oncologist note --  no evidence of disease  . Sarcoidosis    per pt year 1980--- last Chest CT 05-15-2017  . Type 2 diabetes mellitus ([place])   . Wears partial dentures    upper and lower   Past Surgical History:  Procedure Laterality Date  . COLONOSCOPY N/A 05/16/2013   Procedure: COLONOSCOPY;  Surgeon: Daneil Dolin, MD;  [facility];  Service: Endoscopy;  Laterality: N/A;  9:30  . cyst on left shoulder  yrs ago  . CYSTOSCOPY N/A 10/10/2017   Procedure: CYSTOSCOPY FLEXIBLE;  Surgeon: Cleon Gustin, MD;  Location: [facility];  Service: Urology;  Laterality: N/A;  2 seeds removed from bladder per Dr Alyson Ingles  . GOLD SEED IMPLANT N/A 10/10/2017   Procedure: GOLD SEED IMPLANT;  Surgeon: Cleon Gustin, MD;  Location: [facility];  Service: Urology;  Laterality: N/A;  3 gold seeds implanted  . MOLE REMOVAL     lower back by dr Romona Curls  . NEPHRECTOMY Left 01/20/2017   Procedure: OPEN NEPHRECTOMY;  Surgeon: Cleon Gustin, MD;  [facility];  Service: Urology;  Laterality: Left;  . PROSTATE SURGERY    . RADIOACTIVE SEED IMPLANT N/A 10/10/2017   Procedure: RADIOACTIVE SEED IMPLANT/BRACHYTHERAPY IMPLANT;  Surgeon: Cleon Gustin, MD;  Location: [facility];  Service: Urology;  Laterality: N/A;  57 seeds implanted  . SPACE OAR INSTILLATION N/A 10/10/2017   Procedure: SPACE OAR INSTILLATION;  Surgeon: Alyson Ingles,  Candee Furbish, MD;  Location: [facility];  Service: Urology;  Laterality: N/A;  . TRANSTHORACIC ECHOCARDIOGRAM  06/20/2015   mild to moderate LVH, ef 60-65%/  trivial MR    SOCIAL HISTORY:  Social History   Socioeconomic History  . Marital status: Married    Spouse name: Not on file  . Number of children: 1  . Years of education: Not on file  . Highest education level: Not on file  Occupational History    Employer: [employer]  Tobacco Use  . Smoking status: Never Smoker  . Smokeless tobacco: Never  Used  Vaping Use  . Vaping Use: Never used  Substance and Sexual Activity  . Alcohol use: No  . Drug use: No  . Sexual activity: Not Currently  Other Topics Concern  . Not on file  Social History Narrative   Daughter deceased age 48, liver disease ?NASH 2010   Social Determinants of Health   Financial Resource Strain:   . Difficulty of Paying Living Expenses: Not on file  Food Insecurity:   . Worried About Charity fundraiser in the Last Year: Not on file  . Ran Out of Food in the Last Year: Not on file  Transportation Needs:   . Lack of Transportation (Medical): Not on file  . Lack of Transportation (Non-Medical): Not on file  Physical Activity:   . Days of Exercise per Week: Not on file  . Minutes of Exercise per Session: Not on file  Stress:   . Feeling of Stress : Not on file  Social Connections:   . Frequency of Communication with Friends and Family: Not on file  . Frequency of Social Gatherings with Friends and Family: Not on file  . Attends Religious Services: Not on file  . Active Member of Clubs or Organizations: Not on file  . Attends Archivist Meetings: Not on file  . Marital Status: Not on file  Intimate Partner Violence:   . Fear of Current or Ex-Partner: Not on file  . Emotionally Abused: Not on file  . Physically Abused: Not on file  . Sexually Abused: Not on file    FAMILY HISTORY:  Family History  Problem Relation Age of Onset  . Liver disease Father        NASH? age 51  . Diabetes Father   . Hypertension Mother   . Colon cancer Neg Hx     CURRENT MEDICATIONS:  Current Outpatient Medications  Medication Sig Dispense Refill  . aspirin EC 81 MG tablet Take 81 mg by mouth every evening.    [name] atorvastatin (LIPITOR) 80 MG tablet Take 1 tablet (80 mg total) by mouth daily at 6 PM. (Patient taking differently: Take 80 mg by mouth every evening. ) 30 tablet 12  . AZOPT 1 % ophthalmic suspension Place 1 drop into both eyes 2 (two) times  daily.     [name] glipiZIDE (GLUCOTROL) 5 MG tablet Take 1 tablet (5 mg total) by mouth 2 (two) times daily before a meal. 60 tablet 3  . lisinopril (PRINIVIL,ZESTRIL) 2.5 MG tablet Take 1 tablet (2.5 mg total) by mouth daily. (Patient taking differently: Take 2.5 mg by mouth every evening. ) 30 tablet 12   No current facility-administered medications for this visit.   Facility-Administered Medications Ordered in Other Visits  Medication Dose Route Frequency Provider Last Rate Last Admin  . magnesium citrate solution 1 Bottle  1 Bottle Oral Once McKenzie, Candee Furbish, MD  ALLERGIES:  No Known Allergies  PHYSICAL EXAM:  Performance status (ECOG): 1 - Symptomatic but completely ambulatory  Vitals:   11/11/20 1419  BP: (!) 141/68  Pulse: 73  Resp: 18  Temp: (!) 96.8 F (36 C)   Wt Readings from Last 3 Encounters:  11/11/20 207 lb 3.2 oz (94 kg)  08/06/20 209 lb 9.6 oz (95.1 kg)  07/15/20 207 lb (93.9 kg)   Physical Exam Vitals reviewed.  Constitutional:      Appearance: Normal appearance. He is obese.  Neurological:     General: No focal deficit present.     Mental Status: He is alert and oriented to person, place, and time.  Psychiatric:        Mood and Affect: Mood normal.        Behavior: Behavior normal.     LABORATORY DATA:  I have reviewed the labs as listed.  CBC Latest Ref Rng & Units 11/06/2020 07/30/2020 04/20/2020  WBC 4.0 - 10.5 K/uL 5.8 4.4 4.8  Hemoglobin 13.0 - 17.0 g/dL 11.1(L) 11.4(L) 11.3(L)  Hematocrit 39 - 52 % 36.5(L) 38.7(L) 37.2(L)  Platelets 150 - 400 K/uL 209 212 231   CMP Latest Ref Rng & Units 11/06/2020 07/14/2020 04/20/2020  Glucose 70 - 99 mg/dL 106(H) 140(H) 142(H)  BUN 8 - 23 mg/dL 22 30(H) 30(H)  Creatinine 0.61 - 1.24 mg/dL 1.48(H) 1.53(H) 1.41(H)  Sodium 135 - 145 mmol/L 141 138 141  Potassium 3.5 - 5.1 mmol/L 4.6 4.7 4.7  Chloride 98 - 111 mmol/L 108 105 106  CO2 22 - 32 mmol/L 22 22 23   Calcium 8.9 - 10.3 mg/dL 9.0 9.3 9.2  Total  Protein 6.5 - 8.1 g/dL 8.1 8.0 8.1  Total Bilirubin 0.3 - 1.2 mg/dL 0.8 0.4 0.5  Alkaline Phos 38 - 126 U/L 74 - 76  AST 15 - 41 U/L 27 23 26   ALT 0 - 44 U/L 24 19 24       Component Value Date/Time   RBC 4.04 (L) 11/06/2020 1339   MCV 90.3 11/06/2020 1339   MCH 27.5 11/06/2020 1339   MCHC 30.4 11/06/2020 1339   RDW 17.5 (H) 11/06/2020 1339   LYMPHSABS 1.3 11/06/2020 1339   MONOABS 0.7 11/06/2020 1339   EOSABS 0.1 11/06/2020 1339   BASOSABS 0.0 11/06/2020 1339   Lab Results  Component Value Date   TIBC 324 11/06/2020   TIBC 342 07/30/2020   TIBC 345 04/20/2020   FERRITIN 127 11/06/2020   FERRITIN 65 07/30/2020   FERRITIN 70 04/20/2020   IRONPCTSAT 16 (L) 11/06/2020   IRONPCTSAT 16 (L) 07/30/2020   IRONPCTSAT 14 (L) 04/20/2020    DIAGNOSTIC IMAGING:  I have independently reviewed the scans and discussed with the patient. CT Abdomen Pelvis W Wo Contrast  Result Date: 11/08/2020 CLINICAL DATA:  Liver nodule.  Iron deficiency anemia. EXAM: CT ABDOMEN AND PELVIS WITHOUT AND WITH CONTRAST TECHNIQUE: Multidetector CT imaging of the abdomen and pelvis was performed following the standard protocol before and following the bolus administration of intravenous contrast. CONTRAST:  163mL OMNIPAQUE IOHEXOL 300 MG/ML  SOLN COMPARISON:  04/21/2020 FINDINGS: Lower chest: Chronic fibrotic changes noted in the lung bases as before. Juxta diaphragmatic lymph nodes on the right are stable. Hepatobiliary: Nodular hepatic contour is consistent with cirrhosis. Tiny hypervascular focus in the dome of the left liver is similar measuring 4-5 mm today compared to 4 mm previously. The second focus of arterial phase hyperenhancement identified previously is in the inferior right liver  on image 43/6, measuring 13 x 10 mm today compared to 10 x 8 mm previously. This lesion is quite subtle with ill-defined margins which may account for the measurement differences qualitatively it is quite similar. A second very  subtle area of potential mild arterial phase hyperenhancement in the inferior right liver on 47/6 measures about 14 mm maximum dimension. No new suspicious foci of arterial phase hyperenhancement on today's exam. Calcified gallstones again noted. No intrahepatic or extrahepatic biliary dilation. Pancreas: No focal mass lesion. No dilatation of the main duct. No intraparenchymal cyst. No peripancreatic edema. Spleen: No splenomegaly. No focal mass lesion. Adrenals/Urinary Tract: Right adrenal gland unremarkable. Left adrenal gland not well visualized given left nephrectomy. Stable appearance 2.9 cm complex (Bosniak II) cyst in the interpolar right kidney. Densely calcified exophytic lower interpolar right renal lesion measuring 4.3 cm today again noted to have macroscopic fat and likely benign. Additional small hypoattenuating lesions in the right kidney are stable. No right hydroureter. The urinary bladder appears normal for the degree of distention. Stomach/Bowel: Stomach is unremarkable. No gastric wall thickening. No evidence of outlet obstruction. Duodenum is normally positioned as is the ligament of Treitz. No small bowel wall thickening. No small bowel dilatation. The terminal ileum is normal. The appendix is normal. No gross colonic mass. No colonic wall thickening. Diverticular changes are noted in the left colon without evidence of diverticulitis. Vascular/Lymphatic: There is abdominal aortic atherosclerosis without aneurysm. Small lymph nodes are seen in the gastrohepatic and hepatoduodenal ligaments as before. Central mesenteric lymph nodes measure up to 13 mm short axis, stable. No pelvic sidewall lymphadenopathy. Reproductive: Brachytherapy seeds noted in the prostate gland. Other: No intraperitoneal free fluid. Musculoskeletal: A left lumbar incisional hernia contains a portion of the descending colon without complicating features. No worrisome lytic or sclerotic osseous abnormality. IMPRESSION: 1.  Nodular hepatic contour consistent with cirrhosis. 2. Two areas of arterial phase hyperenhancement identified in the inferior right liver are similar to prior study and remain compatible with LI-RADS 3 category. The inferior right hepatic lesion may have progressed slightly in the interval. Continued surveillance recommended. If the patient is able to reproducibly hold his breath, MRI of the abdomen recommended in 6 months. 3. Cholelithiasis. 4. Left lumbar incisional hernia contains a portion of the descending colon without complicating features. 5. Stable complex (Bosniak II) right renal cyst. 6. Aortic Atherosclerosis (ICD10-I70.0). Electronically Signed   By: Misty Stanley M.D.   On: 11/08/2020 04:30     ASSESSMENT:  1. Right kidney mass: -CTAP on 01/15/2020 showed stable appearance of the right mid kidney exophytic lesion with a large ossific competent, smaller fatty competent and soft tissue competent. Probably benign compared to December 2017. 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  2. Normocytic anemia: -We reviewed CBC 04/20/2020. Hemoglobin is stable at 11.3. Ferritin is 70 and percent saturation is 14. Folic acid and X32 are normal. 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 Will consider Feraheme if there is any drop in hemoglobin.  3. Liver masses: -Alpha-fetoprotein is 6.3. Hepatitis B panel was negative. -We reviewed results of the CT abdomen with and without contrast from 04/21/2020. Segment 2 lesion is LiRADS 3. Segment 3 lesion is no longer visible. Lesion posteromedially in the right hepatic lobe is LiRADS3.  4. High-grade prostate cancer: -He had prostate cancer with Gleason 4+5, PSA 14.1. -He received IMRT and seed implants from 11/07/2017 through 12/14/2017. -He reportedly receives Lupron every 6  months until January. -He still has some hot flashes. His testosterone levels were  reportedly low. He follows up with Dr. Alyson Ingles at [facility] urology.   PLAN:  1.  Normocytic anemia: -Denies any bleeding per rectum or melena.  Reviewed labs from 11/06/2020.  Hemoglobin is 11.1. -Ferritin is 127 and percent saturation is 16. -RTC 6 months with repeat labs.  2.  Liver masses: -LFTs are normal. -Recommend MRI of the abdomen with and without contrast prior to next visit for follow-up of liver lesions.  3.  High-grade prostate cancer: -Last PSA was less than 0.1.  Orders placed this encounter:  Orders Placed This Encounter  Procedures  . MR Abdomen W Wo Contrast  . CBC with Differential/Platelet  . Comprehensive metabolic panel  . Ferritin  . Iron and TIBC  . AFP tumor marker     Derek Jack, MD [place] [phone]   I, Milinda Antis, am acting as a scribe for Dr. Sanda Linger.  I, Derek Jack MD, have reviewed the above documentation for accuracy and completeness, and I agree with the above.

## 2021-01-20 ENCOUNTER — Other Ambulatory Visit: Payer: Self-pay

## 2021-01-20 ENCOUNTER — Ambulatory Visit (INDEPENDENT_AMBULATORY_CARE_PROVIDER_SITE_OTHER): Payer: Medicare Other | Admitting: Urology

## 2021-01-20 VITALS — BP 156/70 | HR 82 | Temp 98.7°F | Ht 67.0 in | Wt 209.0 lb

## 2021-01-20 DIAGNOSIS — N2889 Other specified disorders of kidney and ureter: Secondary | ICD-10-CM | POA: Diagnosis not present

## 2021-01-20 DIAGNOSIS — C61 Malignant neoplasm of prostate: Secondary | ICD-10-CM

## 2021-01-20 LAB — MICROSCOPIC EXAMINATION
RBC, Urine: NONE SEEN /hpf (ref 0–2)
Renal Epithel, UA: NONE SEEN /hpf

## 2021-01-20 LAB — URINALYSIS, ROUTINE W REFLEX MICROSCOPIC
Bilirubin, UA: NEGATIVE
Glucose, UA: NEGATIVE
Ketones, UA: NEGATIVE
Nitrite, UA: POSITIVE — AB
Protein,UA: NEGATIVE
RBC, UA: NEGATIVE
Specific Gravity, UA: 1.015 (ref 1.005–1.030)
Urobilinogen, Ur: 0.2 mg/dL (ref 0.2–1.0)
pH, UA: 5 (ref 5.0–7.5)

## 2021-01-20 NOTE — Progress Notes (Signed)
01/20/2021 4:00 PM   Jon [facility]. 03/08/1941 [id number]  Referring provider: Maryruth Hancock, MD [address] [place],  [place] [address]  Followup RCC and prostate cancer  HPI: Jon [facility] is a 80yo here for followup for RCC and prostate cancer. PSA undetectable. Mild hot flashes. CT from 11/06/2020 showed no evidence of metastatic disease. No significant LUTS. No new complaints today.   PMH: Past Medical History:  Diagnosis Date  . Anemia   . Cataract   . Diverticulosis of colon   . Frequency of urination   . Glaucoma, both eyes   . Heart murmur   . History of adenomatous polyp of colon 05/16/2013  . History of transient ischemic attack (TIA) 06/19/2015   no residual  . Hot flashes   . Hyperlipidemia   . Nocturia   . Prostate cancer [facility]) urologist-  dr Alyson Ingles  oncologist -- dr Tammi Klippel   dx 11-08-20147 via bx--- Gleason 4+5,  PSA 14.1, vol 52cc/  08-16-2017 last PSA  1.3 (per dr Alyson Ingles office note)    . Renal cell carcinoma, left [facility]) oncologist-  dr Barbaraann Share zhou ([facility])/  urologist-  dr Alyson Ingles   Stage III (pT3a, pN0)  Chromophobe RCC into perinephric fat /  01-20-2017 s/p  open radical left nephrecotmy/- per [facility] oncologist note -- no evidence of disease  . Sarcoidosis    per pt year 1980--- last Chest CT 05-15-2017  . Type 2 diabetes mellitus ([place])   . Wears partial dentures    upper and lower    Surgical History: Past Surgical History:  Procedure Laterality Date  . COLONOSCOPY N/A 05/16/2013   Procedure: COLONOSCOPY;  Surgeon: Daneil Dolin, MD;  [facility];  Service: Endoscopy;  Laterality: N/A;  9:30  . cyst on left shoulder  yrs ago  . CYSTOSCOPY N/A 10/10/2017   Procedure: CYSTOSCOPY FLEXIBLE;  Surgeon: Cleon Gustin, MD;  Location: [facility];  Service: Urology;  Laterality: N/A;  2 seeds removed from bladder per Dr Alyson Ingles  . GOLD SEED IMPLANT N/A 10/10/2017   Procedure: GOLD SEED IMPLANT;  Surgeon:  Cleon Gustin, MD;  Location[facility];  Service: Urology;  Laterality: N/A;  3 gold seeds implanted  . MOLE REMOVAL     lower back by dr Romona Curls  . NEPHRECTOMY Left 01/20/2017   Procedure: OPEN NEPHRECTOMY;  Surgeon: Cleon Gustin, MD;  [facility];  Service: Urology;  Laterality: Left;  . PROSTATE SURGERY    . RADIOACTIVE SEED IMPLANT N/A 10/10/2017   Procedure: RADIOACTIVE SEED IMPLANT/BRACHYTHERAPY IMPLANT;  Surgeon: Cleon Gustin, MD;  Location: [facility];  Service: Urology;  Laterality: N/A;  57 seeds implanted  . SPACE OAR INSTILLATION N/A 10/10/2017   Procedure: SPACE OAR INSTILLATION;  Surgeon: Cleon Gustin, MD;  Location: [facility];  Service: Urology;  Laterality: N/A;  . TRANSTHORACIC ECHOCARDIOGRAM  06/20/2015   mild to moderate LVH, ef 60-65%/  trivial Jon    Home Medications:  Allergies as of 01/20/2021   No Known Allergies     Medication List       Accurate as of January 20, 2021  4:00 PM. If you have any questions, ask your nurse or doctor.        aspirin EC 81 MG tablet Take 81 mg by mouth every evening.   atorvastatin 80 MG tablet Commonly known as: LIPITOR Take 1 tablet (80 mg total) by  mouth daily at 6 PM. What changed: when to take this   Azopt 1 % ophthalmic suspension Generic drug: brinzolamide Place 1 drop into both eyes 2 (two) times daily.   glipiZIDE 5 MG tablet Commonly known as: GLUCOTROL Take 1 tablet (5 mg total) by mouth 2 (two) times daily before a meal.   lisinopril 2.5 MG tablet Commonly known as: ZESTRIL Take 1 tablet (2.5 mg total) by mouth daily. What changed: when to take this       Allergies: No Known Allergies  Family History: Family History  Problem Relation Age of Onset  . Liver disease Father        NASH? age 24  . Diabetes Father   . Hypertension Mother   . Colon cancer Neg Hx     Social History:  reports that he has never smoked. He  has never used smokeless tobacco. He reports that he does not drink alcohol and does not use drugs.  ROS: All other review of systems were reviewed and are negative except what is noted above in HPI  Physical Exam: BP (!) 156/70   Pulse 82   Temp 98.7 F (37.1 C) (Oral)   Ht 5\' 7"  (1.702 m)   Wt 209 lb (94.8 kg)   BMI 32.73 kg/m   Constitutional:  Alert and oriented, No acute distress. HEENT: [place] AT, moist mucus membranes.  Trachea midline, no masses. Cardiovascular: No clubbing, cyanosis, or edema. Respiratory: Normal respiratory effort, no increased work of breathing. GI: Abdomen is soft, nontender, nondistended, no abdominal masses GU: No CVA tenderness.  Lymph: No cervical or inguinal lymphadenopathy. Skin: No rashes, bruises or suspicious lesions. Neurologic: Grossly intact, no focal deficits, moving all 4 extremities. Psychiatric: Normal mood and affect.  Laboratory Data: Lab Results  Component Value Date   WBC 5.8 11/06/2020   HGB 11.1 (L) 11/06/2020   HCT 36.5 (L) 11/06/2020   MCV 90.3 11/06/2020   PLT 209 11/06/2020    Lab Results  Component Value Date   CREATININE 1.48 (H) 11/06/2020    Lab Results  Component Value Date   PSA <0.1 07/14/2020   PSA <0.1 01/08/2020   PSA 25.28 (H) 12/22/2016    No results found for: TESTOSTERONE  Lab Results  Component Value Date   HGBA1C 6.7 (A) 01/30/2020    Urinalysis    Component Value Date/Time   COLORURINE YELLOW 06/19/2015 1819   APPEARANCEUR CLEAR 06/19/2015 1819   LABSPEC 1.025 06/19/2015 1819   PHURINE 5.5 06/19/2015 1819   GLUCOSEU 100 (A) 06/19/2015 1819   HGBUR TRACE (A) 06/19/2015 1819   BILIRUBINUR NEGATIVE 06/19/2015 1819   KETONESUR NEGATIVE 06/19/2015 1819   PROTEINUR NEGATIVE 06/19/2015 1819   UROBILINOGEN 0.2 06/19/2015 1819   NITRITE POSITIVE (A) 06/19/2015 1819   LEUKOCYTESUR MODERATE (A) 06/19/2015 1819    Lab Results  Component Value Date   BACTERIA MANY (A) 06/19/2015     Pertinent Imaging: CT abd/pelvic 11/06/2020: Images reviewed and discussed with the patient No results found for this or any previous visit.  No results found for this or any previous visit.  No results found for this or any previous visit.  No results found for this or any previous visit.  No results found for this or any previous visit.  No results found for this or any previous visit.  No results found for this or any previous visit.  No results found for this or any previous visit.   Assessment & Plan:  1. Prostate cancer ([place]) -PSA today, will call with results. If stable RTC 6 months with PSA - Urinalysis, Routine w reflex microscopic  2. Renal mass -No evidence of recurrence. RTC 6 months with CMP    No follow-ups on file.  Nicolette Bang, MD  [facility] [place]

## 2021-01-20 NOTE — Progress Notes (Signed)
Urological Symptom Review  Patient is experiencing the following symptoms: Get up at night to urinate Erection problems (male only)   Review of Systems  Gastrointestinal (upper)  : Negative for upper GI symptoms  Gastrointestinal (lower) : Constipation  Constitutional : hot flashes  Skin: Negative for skin symptoms  Eyes: Negative for eye symptoms  Ear/Nose/Throat : Negative for Ear/Nose/Throat symptoms  Hematologic/Lymphatic: Negative for Hematologic/Lymphatic symptoms  Cardiovascular : Negative for cardiovascular symptoms  Respiratory : Negative for respiratory symptoms  Endocrine: Negative for endocrine symptoms  Musculoskeletal: Negative for musculoskeletal symptoms  Neurological: Negative for neurological symptoms  Psychologic: Negative for psychiatric symptoms

## 2021-01-20 NOTE — Patient Instructions (Signed)
Prostate Cancer  The prostate is a male gland that helps make semen. It is located below a man's bladder, in front of the rectum. Prostate cancer is when abnormal cells grow in this gland. What are the causes? The cause of this condition is not known. What increases the risk? You are more likely to develop this condition if:  You are 78 years of age or older.  You are African American.  You have a family history of prostate cancer.  You have a family history of breast cancer. What are the signs or symptoms? Symptoms of this condition include:  A need to pee often.  Peeing that is weak, or pee that stops and starts.  Trouble starting or stopping your pee.  Inability to pee.  Blood in your pee or semen.  Pain in the lower back, lower belly (abdomen), hips, or upper thighs.  Trouble getting an erection.  Trouble emptying all of your pee. How is this treated? Treatment for this condition depends on your age, your health, the kind of treatment you like, and how far the cancer has spread. Treatments include:  Being watched. This is called observation. You will be tested from time to time, but you will not get treated. Tests are to make sure that the cancer is not growing.  Surgery. This may be done to remove the prostate, to remove the testicles, or to freeze or kill cancer cells.  Radiation. This uses a strong beam to kill cancer cells.  Ultrasound energy. This uses strong sound waves to kill cancer cells.  Chemotherapy. This uses medicines that stop cancer cells from increasing. This kills cancer cells and healthy cells.  Targeted therapy. This kills cancer cells only. Healthy cells are not affected.  Hormone treatment. This stops the body from making hormones that help the cancer cells to grow. Follow these instructions at home:  Take over-the-counter and prescription medicines only as told by your doctor.  Eat a healthy diet.  Get plenty of sleep.  Ask your  doctor for help to find a support group for men with prostate cancer.  If you have to go to the hospital, let your cancer doctor (oncologist) know.  Treatment may affect your ability to have sex. Touch, hold, hug, and caress your partner to have intimate moments.  Keep all follow-up visits as told by your doctor. This is important. Contact a doctor if:  You have new or more trouble peeing.  You have new or more blood in your pee.  You have new or more pain in your hips, back, or chest. Get help right away if:  You have weakness in your legs.  You lose feeling in your legs.  You cannot control your pee or your poop (stool).  You have chills or a fever. Summary  The prostate is a male gland that helps make semen.  Prostate cancer is when abnormal cells grow in this gland.  Treatment includes doing surgery, using medicines, using very strong beams, or watching without treatment.  Ask your doctor for help to find a support group for men with prostate cancer.  Contact a doctor if you have problems peeing or have any new pain that you did not have before. This information is not intended to replace advice given to you by your health care provider. Make sure you discuss any questions you have with your health care provider. Document Revised: 11/26/2019 Document Reviewed: 11/26/2019 Elsevier Patient Education  2021 Elsevier Inc.  

## 2021-01-21 LAB — PSA: Prostate Specific Ag, Serum: <0.1 ng/mL (ref 0.0–4.0)

## 2021-01-29 NOTE — Progress Notes (Signed)
Results mailed 

## 2021-02-01 ENCOUNTER — Encounter: Payer: Self-pay | Admitting: Urology

## 2021-04-23 IMAGING — CT CT ABDOMEN WO/W CM
3 of 10 series · 10 of 46 positions shown, 16 images · IV contrast (Omnipaque or Isovue)
Comparison: 01/15/2020

CLINICAL DATA: Hepatic cirrhosis with small arterial phase
enhancing lesions in the left lobe. Prior left nephrectomy. History
of renal cell carcinoma. History of prostate cancer.

EXAM:
CT ABDOMEN WITHOUT AND WITH CONTRAST
TECHNIQUE: Multidetector CT imaging of the abdomen was performed following the
standard protocol before and following the bolus administration of
intravenous contrast.
CONTRAST:  100mL OMNIPAQUE IOHEXOL 300 MG/ML  SOLN

[Series 4: axial arterial · axial · arterial · 0.87mm/px · z∈[+1126,+1168]mm · 2 of 85 slices shown]
[im 15/85  soft-tissue]
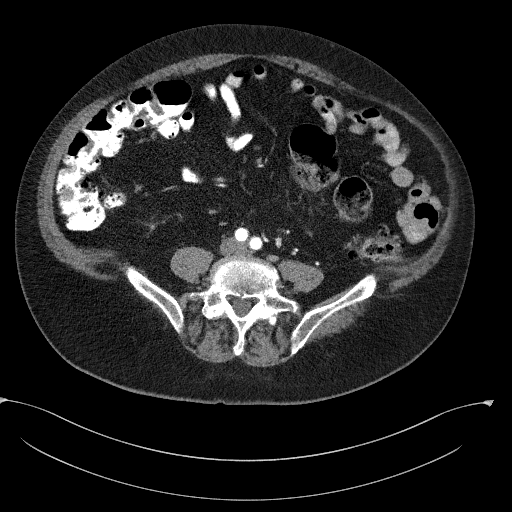
[im 29/85  soft-tissue]
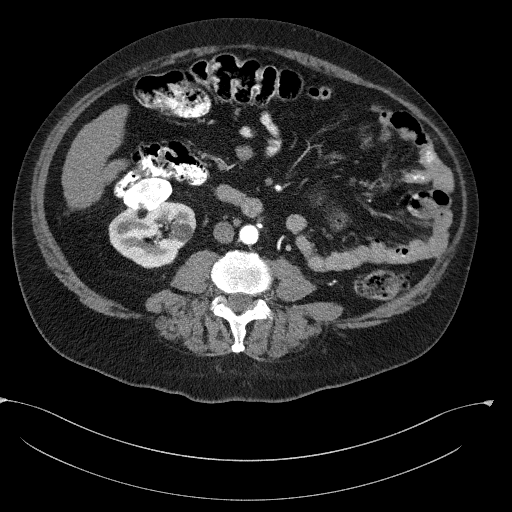

[Series 5: axial venous · axial · portal-venous · 0.87mm/px · z∈[+1126,+1294]mm · 5 of 85 slices shown, 10 images]
[im 15/85  soft-tissue]
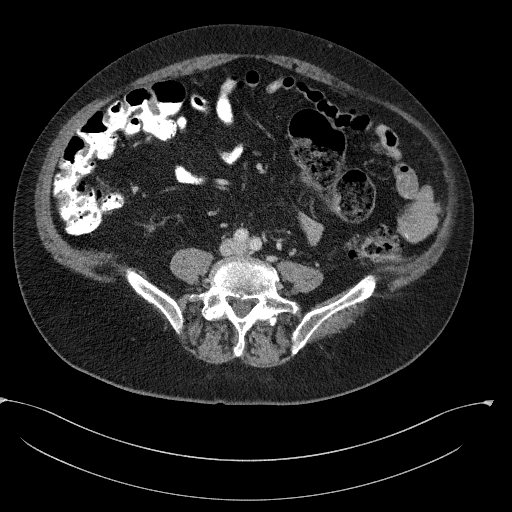
[im 15/85  bone]
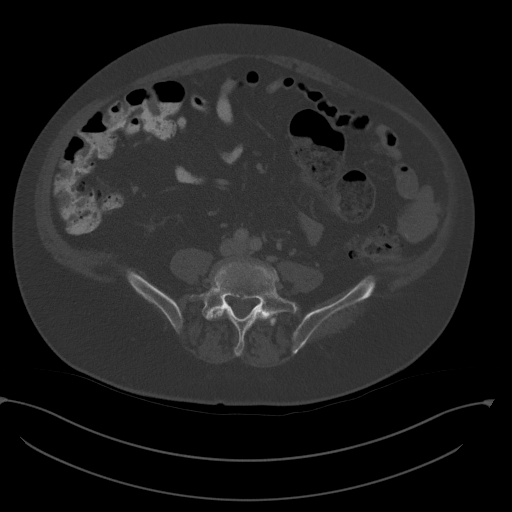
[im 29/85  soft-tissue]
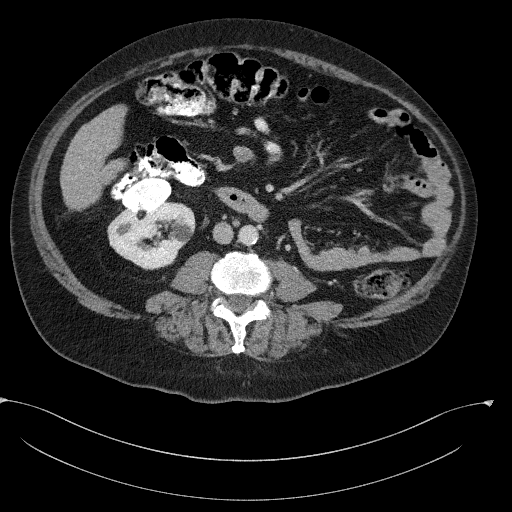
[im 29/85  lung]
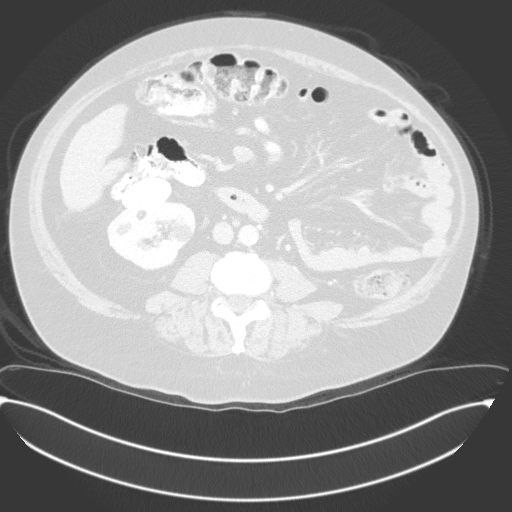
[im 43/85  soft-tissue]
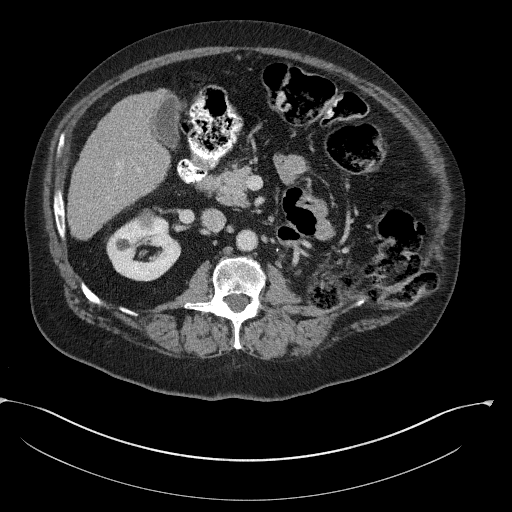
[im 43/85  lung]
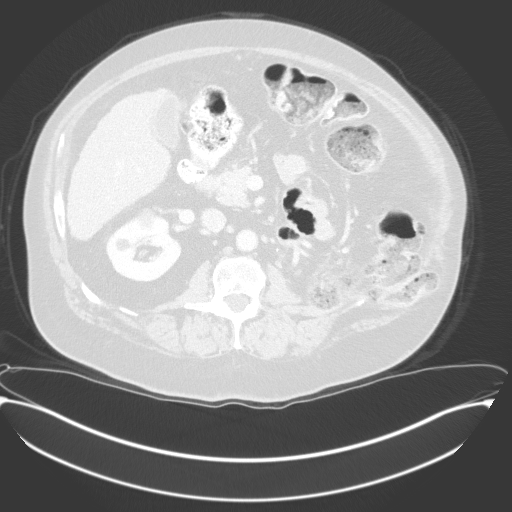
[im 57/85  soft-tissue]
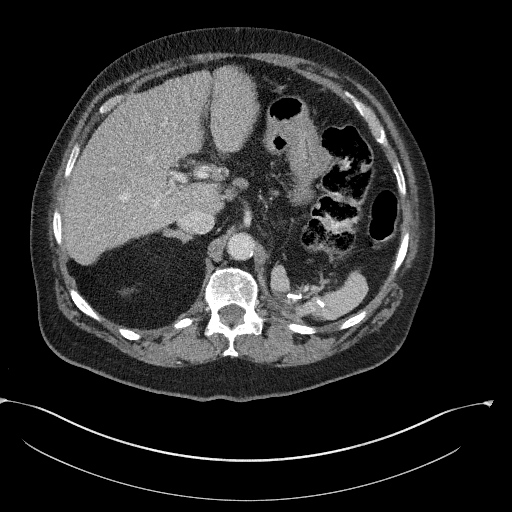
[im 57/85  lung]
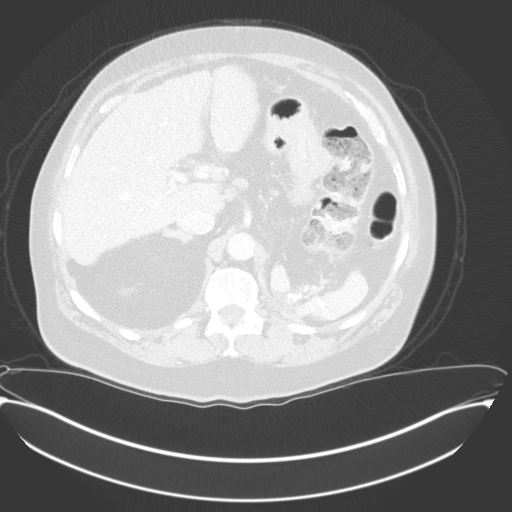
[im 71/85  soft-tissue]
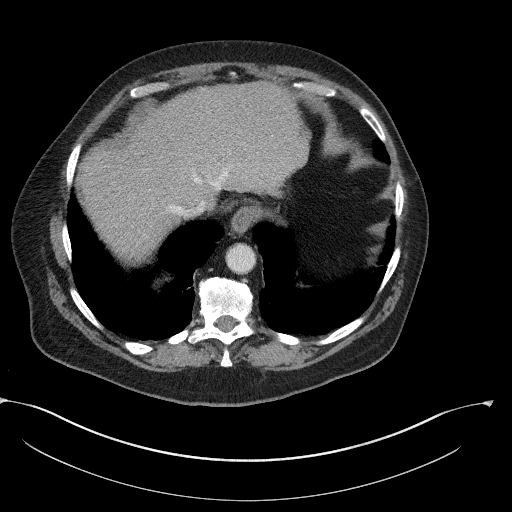
[im 71/85  lung]
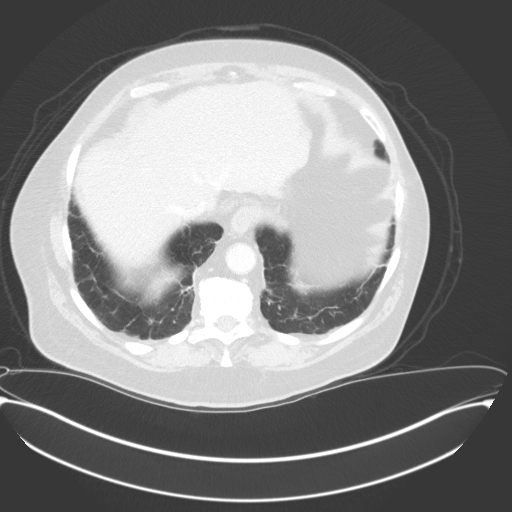

[Series 11: coronal arterial · coronal · arterial · 0.53mm/px · 3 of 101 slices shown, 4 images]
[im 26/101  soft-tissue]
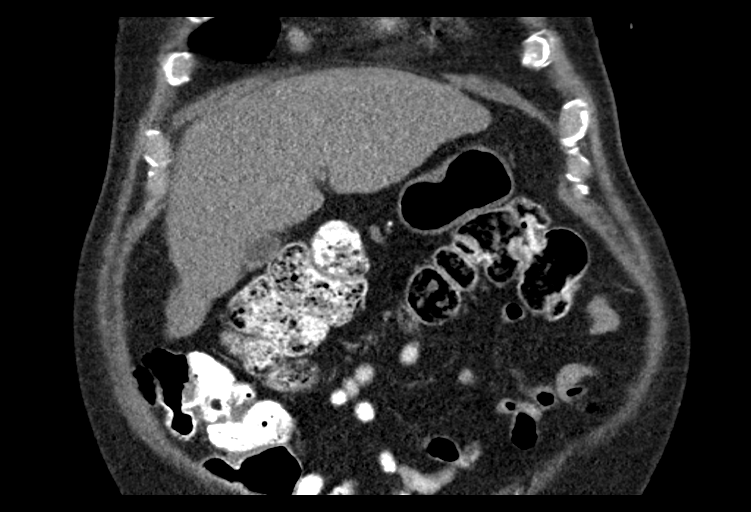
[im 51/101  soft-tissue]
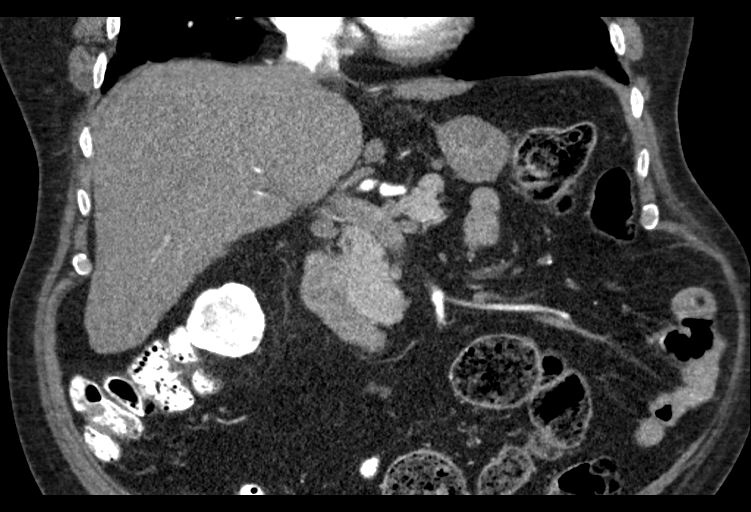
[im 51/101  bone]
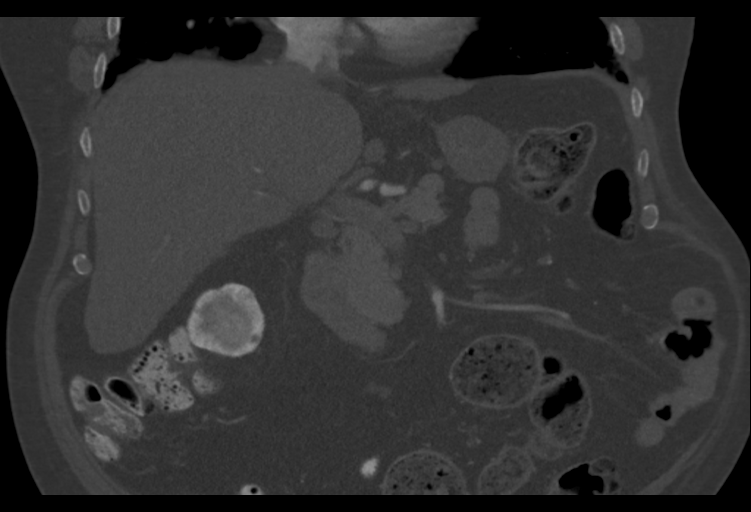
[im 76/101  soft-tissue]
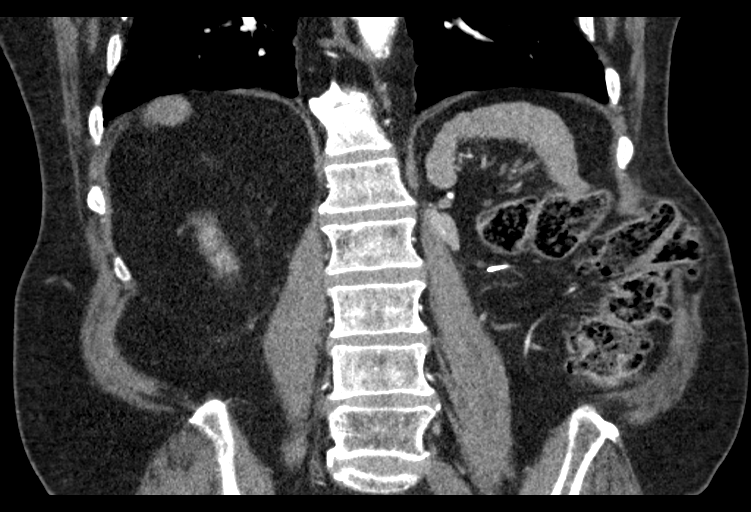

[10 of 46 positions shown; findings below may reference images not displayed]

FINDINGS: Lower chest: Lymph node in the pericardial adipose tissue 0.9 cm in
short axis on image [DATE], previously 1.0 cm.

Hepatobiliary: Questionable 4 mm arterial phase enhancing lesion in
segment 2 of the liver on image [DATE], previously measuring 0.6 cm in
diameter.

The other focus of arterial phase enhancement in segment 3 of the
liver is not well seen today.

Posteriorly in the right hepatic lobe, a 1.0 by 0.8 cm focus of
arterial phase enhancement is stable by my measurements and appears
to have delayed enhancement on image 50/5 and a somewhat similar
appearance back on 12/09/2016. This is probably a small8 vascular
malformation or hemangioma but merit surveillance in this setting.

No new lesions are identified.

Cholelithiasis is present. Nodular liver contour compatible
cirrhosis.

Pancreas: Unremarkable

Spleen: Nodularity of the spleen without focal splenic lesion.

Adrenals/Urinary Tract: Right adrenal gland normal. Left
nephrectomy. Poor definition of the left adrenal gland.

Stable complex cystic lesion in the left mid kidney 2.8 by 2.3 cm on
image 45/4. Densely calcified exophytic lesion from the left mid
kidney with fatty elements noted, similar back through 3138, and
likely benign. Other small right renal lesions are likely cysts but
technically too small to characterize.

Stomach/Bowel: A left lumbar hernia contains a small portion of the
descending colon. Descending colon diverticulosis.

Vascular/Lymphatic: Aortoiliac atherosclerotic vascular disease.
Chronically stable reactive lymph nodes along the porta hepatis and
right gastric chain.

Other: No supplemental non-categorized findings.

Musculoskeletal: Mild lumbar spondylosis and degenerative disc
disease. Left lumbar hernia as noted above
IMPRESSION: 1. The segment 2 lesion remains WO category LR-3. The segment 3
lesion is no longer well seen. The lesion posteromedially in the
right hepatic lobe is WO category LR-3. Consider surveillance
imaging in 6 months time by hepatic protocol CT or MRI.
2. Cirrhosis.
3. Cholelithiasis.
4. Left lumbar hernia contains a small portion of the descending
colon.
5. Descending colon diverticulosis.
6. Stable complex cystic lesion in the left mid kidney. Stable
unusual chronically unchanged ossific and fatty lesion exophytic
from the right mid kidney.
7. Aortic atherosclerosis.

Aortic Atherosclerosis ([id number]).

## 2021-05-11 ENCOUNTER — Inpatient Hospital Stay (HOSPITAL_COMMUNITY): Payer: Medicare Other

## 2021-05-11 ENCOUNTER — Ambulatory Visit (HOSPITAL_COMMUNITY): Payer: Medicare Other

## 2021-05-18 ENCOUNTER — Ambulatory Visit (HOSPITAL_COMMUNITY): Payer: Medicare Other | Admitting: Hematology

## 2021-05-25 ENCOUNTER — Inpatient Hospital Stay (HOSPITAL_COMMUNITY): Payer: Medicare Other | Attending: Hematology

## 2021-05-25 ENCOUNTER — Other Ambulatory Visit: Payer: Self-pay

## 2021-05-25 ENCOUNTER — Ambulatory Visit (HOSPITAL_COMMUNITY)
Admission: RE | Admit: 2021-05-25 | Discharge: 2021-05-25 | Disposition: A | Discharge status: Home / Self Care | Payer: Medicare Other | Source: Ambulatory Visit | Attending: Hematology | Admitting: Hematology

## 2021-05-25 DIAGNOSIS — C787 Secondary malignant neoplasm of liver and intrahepatic bile duct: Secondary | ICD-10-CM | POA: Insufficient documentation

## 2021-05-25 DIAGNOSIS — Z85528 Personal history of other malignant neoplasm of kidney: Secondary | ICD-10-CM | POA: Diagnosis present

## 2021-05-25 DIAGNOSIS — K7460 Unspecified cirrhosis of liver: Secondary | ICD-10-CM | POA: Insufficient documentation

## 2021-05-25 DIAGNOSIS — Z905 Acquired absence of kidney: Secondary | ICD-10-CM | POA: Diagnosis not present

## 2021-05-25 DIAGNOSIS — K7689 Other specified diseases of liver: Secondary | ICD-10-CM

## 2021-05-25 DIAGNOSIS — D508 Other iron deficiency anemias: Secondary | ICD-10-CM

## 2021-05-25 DIAGNOSIS — Z8546 Personal history of malignant neoplasm of prostate: Secondary | ICD-10-CM | POA: Diagnosis present

## 2021-05-25 DIAGNOSIS — K8020 Calculus of gallbladder without cholecystitis without obstruction: Secondary | ICD-10-CM | POA: Insufficient documentation

## 2021-05-25 DIAGNOSIS — K5730 Diverticulosis of large intestine without perforation or abscess without bleeding: Secondary | ICD-10-CM | POA: Insufficient documentation

## 2021-05-25 LAB — CBC WITH DIFFERENTIAL/PLATELET
Abs Immature Granulocytes: 0.02 10*3/uL (ref 0.00–0.07)
Basophils Absolute: 0 10*3/uL (ref 0.0–0.1)
Basophils Relative: 0 %
Eosinophils Absolute: 0.3 10*3/uL (ref 0.0–0.5)
Eosinophils Relative: 6 %
HCT: 36.5 % — ABNORMAL LOW (ref 39.0–52.0)
Hemoglobin: 10.9 g/dL — ABNORMAL LOW (ref 13.0–17.0)
Immature Granulocytes: 0 %
Lymphocytes Relative: 21 %
Lymphs Abs: 1.1 10*3/uL (ref 0.7–4.0)
MCH: 26.9 pg (ref 26.0–34.0)
MCHC: 29.9 g/dL — ABNORMAL LOW (ref 30.0–36.0)
MCV: 90.1 fL (ref 80.0–100.0)
Monocytes Absolute: 0.8 10*3/uL (ref 0.1–1.0)
Monocytes Relative: 15 %
Neutro Abs: 3.1 10*3/uL (ref 1.7–7.7)
Neutrophils Relative %: 58 %
Platelets: 168 10*3/uL (ref 150–400)
RBC: 4.05 MIL/uL — ABNORMAL LOW (ref 4.22–5.81)
RDW: 16.6 % — ABNORMAL HIGH (ref 11.5–15.5)
WBC: 5.4 10*3/uL (ref 4.0–10.5)
nRBC: 0 % (ref 0.0–0.2)

## 2021-05-25 LAB — COMPREHENSIVE METABOLIC PANEL
ALT: 23 U/L (ref 0–44)
AST: 28 U/L (ref 15–41)
Albumin: 3.6 g/dL (ref 3.5–5.0)
Alkaline Phosphatase: 68 U/L (ref 38–126)
Anion gap: 8 (ref 5–15)
BUN: 32 mg/dL — ABNORMAL HIGH (ref 8–23)
CO2: 23 mmol/L (ref 22–32)
Calcium: 8.8 mg/dL — ABNORMAL LOW (ref 8.9–10.3)
Chloride: 106 mmol/L (ref 98–111)
Creatinine, Ser: 1.46 mg/dL — ABNORMAL HIGH (ref 0.61–1.24)
GFR, Estimated: 49 mL/min — ABNORMAL LOW (ref >60)
Glucose, Bld: 114 mg/dL — ABNORMAL HIGH (ref 70–99)
Potassium: 4.6 mmol/L (ref 3.5–5.1)
Sodium: 137 mmol/L (ref 135–145)
Total Bilirubin: 0.6 mg/dL (ref 0.3–1.2)
Total Protein: 7.8 g/dL (ref 6.5–8.1)

## 2021-05-25 LAB — IRON AND TIBC
Iron: 50 ug/dL (ref 45–182)
Saturation Ratios: 16 % — ABNORMAL LOW (ref 17.9–39.5)
TIBC: 312 ug/dL (ref 250–450)
UIBC: 262 ug/dL

## 2021-05-25 LAB — FERRITIN: Ferritin: 121 ng/mL (ref 24–336)

## 2021-05-25 MED ORDER — GADOBUTROL 1 MMOL/ML IV SOLN
7.0000 mL | Freq: Once | INTRAVENOUS | Status: AC | PRN
  Administered 2021-05-25: 10 mL via INTRAVENOUS

## 2021-05-26 LAB — AFP TUMOR MARKER: AFP, Serum, Tumor Marker: 6.3 ng/mL (ref 0.0–8.4)

## 2021-05-27 LAB — POCT I-STAT CREATININE: Creatinine, Ser: 1.4 mg/dL — ABNORMAL HIGH (ref 0.61–1.24)

## 2021-05-31 ENCOUNTER — Ambulatory Visit (HOSPITAL_COMMUNITY): Payer: Medicare Other | Admitting: Hematology

## 2021-06-30 ENCOUNTER — Inpatient Hospital Stay (HOSPITAL_COMMUNITY): Payer: Medicare Other | Attending: Hematology and Oncology | Admitting: Hematology and Oncology

## 2021-06-30 ENCOUNTER — Other Ambulatory Visit: Payer: Self-pay

## 2021-06-30 DIAGNOSIS — Z905 Acquired absence of kidney: Secondary | ICD-10-CM | POA: Insufficient documentation

## 2021-06-30 DIAGNOSIS — Z7984 Long term (current) use of oral hypoglycemic drugs: Secondary | ICD-10-CM | POA: Insufficient documentation

## 2021-06-30 DIAGNOSIS — Z8546 Personal history of malignant neoplasm of prostate: Secondary | ICD-10-CM | POA: Diagnosis not present

## 2021-06-30 DIAGNOSIS — Z79899 Other long term (current) drug therapy: Secondary | ICD-10-CM | POA: Insufficient documentation

## 2021-06-30 DIAGNOSIS — Z85528 Personal history of other malignant neoplasm of kidney: Secondary | ICD-10-CM | POA: Insufficient documentation

## 2021-06-30 DIAGNOSIS — D509 Iron deficiency anemia, unspecified: Secondary | ICD-10-CM

## 2021-06-30 DIAGNOSIS — K7689 Other specified diseases of liver: Secondary | ICD-10-CM | POA: Diagnosis present

## 2021-06-30 DIAGNOSIS — C649 Malignant neoplasm of unspecified kidney, except renal pelvis: Secondary | ICD-10-CM | POA: Diagnosis not present

## 2021-06-30 DIAGNOSIS — E119 Type 2 diabetes mellitus without complications: Secondary | ICD-10-CM | POA: Insufficient documentation

## 2021-06-30 DIAGNOSIS — R932 Abnormal findings on diagnostic imaging of liver and biliary tract: Secondary | ICD-10-CM | POA: Diagnosis not present

## 2021-06-30 DIAGNOSIS — Z7982 Long term (current) use of aspirin: Secondary | ICD-10-CM | POA: Insufficient documentation

## 2021-06-30 DIAGNOSIS — E785 Hyperlipidemia, unspecified: Secondary | ICD-10-CM | POA: Diagnosis not present

## 2021-06-30 DIAGNOSIS — Z8673 Personal history of transient ischemic attack (TIA), and cerebral infarction without residual deficits: Secondary | ICD-10-CM | POA: Diagnosis not present

## 2021-07-01 ENCOUNTER — Encounter (HOSPITAL_COMMUNITY): Payer: Self-pay | Admitting: Hematology and Oncology

## 2021-07-01 ENCOUNTER — Other Ambulatory Visit (HOSPITAL_COMMUNITY): Payer: Self-pay

## 2021-07-01 DIAGNOSIS — C649 Malignant neoplasm of unspecified kidney, except renal pelvis: Secondary | ICD-10-CM

## 2021-07-01 DIAGNOSIS — R932 Abnormal findings on diagnostic imaging of liver and biliary tract: Secondary | ICD-10-CM | POA: Insufficient documentation

## 2021-07-01 DIAGNOSIS — D509 Iron deficiency anemia, unspecified: Secondary | ICD-10-CM | POA: Insufficient documentation

## 2021-07-01 NOTE — Assessment & Plan Note (Signed)
He has borderline iron deficiency He did not tolerate oral iron supplement well due to constipation I recommend he space out his oral iron supplement to every other day and we will recheck his blood work in 6 months

## 2021-07-01 NOTE — Assessment & Plan Note (Signed)
MRI of the abdomen only showed fatty liver changes We discussed the importance of dietary modification and weight loss

## 2021-07-01 NOTE — Assessment & Plan Note (Signed)
Recent imaging show no evidence of disease He will continue close monitoring and follow-up

## 2021-07-01 NOTE — Progress Notes (Signed)
[facility] progress notes  Patient Care Team: Noreene Larsson, NP as PCP - General (Nurse Practitioner) Daneil Dolin, MD as Attending Physician (Gastroenterology)  CHIEF COMPLAINTS/PURPOSE OF VISIT:  Abnormal liver imaging study, history of kidney cancer, iron deficiency anemia  HISTORY OF PRESENTING ILLNESS:  Jon Brooks. 80 y.o. male is seen because his primary oncologist is not available He is being followed because of iron deficiency anemia He takes oral iron supplement daily but tolerated that poorly due to constipation It is slightly improved when he takes stool softeners Otherwise, he is doing well No flank pain No hematuria He had recent MRI of the abdomen for further evaluation of abnormal liver imaging noted on prior scan  I reviewed the patient's records extensive and collaborated the history with the patient. 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 Status post left nephrectomy. No findings to suggest local recurrence of disease and no definite metastatic disease in the chest, abdomen or pelvis. 2. Densely calcified exophytic mass in the lower pole of the right kidney and 2 cm indeterminate lesion in the anterior aspect of the upper pole of the right kidney are both similar to the prior study. These are favored to be benign, but close attention on followup studies is recommended to ensure stability.      MEDICAL HISTORY:  Past Medical History:  Diagnosis Date   Anemia    Cataract    Diverticulosis of colon    Frequency of urination    Glaucoma, both eyes    Heart murmur    History of adenomatous polyp of colon 05/16/2013   History of transient ischemic attack (TIA) 06/19/2015   no residual   Hot flashes    Hyperlipidemia    Nocturia    Prostate cancer [facility])  urologist-  dr Alyson Ingles  oncologist -- dr Tammi Klippel   dx [id number] via bx--- Gleason 4+5,  PSA 14.1, vol 52cc/  08-16-2017 last PSA  1.3 (per dr Alyson Ingles office note)     Renal cell carcinoma, left [facility]) oncologist-  dr Barbaraann Share zhou ([facility])/  urologist-  dr Alyson Ingles   Stage III (pT3a, pN0)  Chromophobe RCC into perinephric fat /  01-20-2017 s/p  open radical left nephrecotmy/- per [facility] oncologist note -- no evidence of disease   Sarcoidosis    per pt year 1980--- last Chest CT 05-15-2017   Type 2 diabetes mellitus ([place])    Wears partial dentures    upper and lower    SURGICAL HISTORY: Past Surgical History:  Procedure Laterality Date   COLONOSCOPY N/A 05/16/2013   Procedure: COLONOSCOPY;  Surgeon: Daneil Dolin, MD;  [facility];  Service: Endoscopy;  Laterality: N/A;  9:30   cyst on left shoulder  yrs ago   CYSTOSCOPY N/A 10/10/2017   Procedure: CYSTOSCOPY FLEXIBLE;  Surgeon: Cleon Gustin, MD;  Location: [facility];  Service: Urology;  Laterality: N/A;  2 seeds removed from bladder per Dr Alyson Ingles   GOLD SEED IMPLANT N/A 10/10/2017   Procedure: GOLD SEED IMPLANT;  Surgeon: Cleon Gustin, MD;  [facility];  Service: Urology;  Laterality: N/A;  3 gold seeds implanted   MOLE REMOVAL     lower back by dr bradford   NEPHRECTOMY Left 01/20/2017   Procedure: OPEN  NEPHRECTOMY;  Surgeon: Cleon Gustin, MD;  [facility];  Service: Urology;  Laterality: Left;   PROSTATE SURGERY     RADIOACTIVE SEED IMPLANT N/A 10/10/2017   Procedure: RADIOACTIVE SEED IMPLANT/BRACHYTHERAPY IMPLANT;  Surgeon: Cleon Gustin, MD;  Location[facility];  Service: Urology;  Laterality: N/A;  57 seeds implanted   SPACE OAR INSTILLATION N/A 10/10/2017   Procedure: SPACE OAR INSTILLATION;  Surgeon: Cleon Gustin, MD;  Location: [facility];  Service: Urology;  Laterality: N/A;   TRANSTHORACIC  ECHOCARDIOGRAM  06/20/2015   mild to moderate LVH, ef 60-65%/  trivial MR    SOCIAL HISTORY: Social History   Socioeconomic History   Marital status: Married    Spouse name: Not on file   Number of children: 1   Years of education: Not on file   Highest education level: Not on file  Occupational History    Employer: [employer]  Tobacco Use   Smoking status: Never   Smokeless tobacco: Never  Vaping Use   Vaping Use: Never used  Substance and Sexual Activity   Alcohol use: No   Drug use: No   Sexual activity: Not Currently  Other Topics Concern   Not on file  Social History Narrative   Daughter deceased age 20, liver disease ?NASH 2010   Social Determinants of Health   Financial Resource Strain: Not on file  Food Insecurity: Not on file  Transportation Needs: Not on file  Physical Activity: Not on file  Stress: Not on file  Social Connections: Not on file  Intimate Partner Violence: Not on file    FAMILY HISTORY: Family History  Problem Relation Age of Onset   Liver disease Father        NASH? age 58   Diabetes Father    Hypertension Mother    Colon cancer Neg Hx     ALLERGIES:  has No Known Allergies.  MEDICATIONS:  Current Outpatient Medications  Medication Sig Dispense Refill   aspirin EC 81 MG tablet Take 81 mg by mouth every evening.     atorvastatin (LIPITOR) 80 MG tablet Take 1 tablet (80 mg total) by mouth daily at 6 PM. (Patient taking differently: Take 80 mg by mouth every evening.) 30 tablet 12   AZOPT 1 % ophthalmic suspension Place 1 drop into both eyes 2 (two) times daily.     glipiZIDE (GLUCOTROL) 5 MG tablet Take 1 tablet (5 mg total) by mouth 2 (two) times daily before a meal. 60 tablet 3   lisinopril (PRINIVIL,ZESTRIL) 2.5 MG tablet Take 1 tablet (2.5 mg total) by mouth daily. (Patient taking differently: Take 2.5 mg by mouth every evening.) 30 tablet 12   No current facility-administered medications for this visit.    Facility-Administered Medications Ordered in Other Visits  Medication Dose Route Frequency Provider Last Rate Last Admin   magnesium citrate solution 1 Bottle  1 Bottle Oral Once McKenzie, Candee Furbish, MD        REVIEW OF SYSTEMS:   Constitutional: Denies fevers, chills or abnormal night sweats Eyes: Denies blurriness of vision, double vision or watery eyes Ears, nose, mouth, throat, and face: Denies mucositis or sore throat Respiratory: Denies cough, dyspnea or wheezes Cardiovascular: Denies palpitation, chest discomfort or lower extremity swelling Gastrointestinal:  Denies nausea, heartburn or change in bowel habits Skin: Denies abnormal skin rashes Lymphatics: Denies new lymphadenopathy or easy bruising Neurological:Denies numbness, tingling or new weaknesses Behavioral/Psych: Mood is stable, no new changes  All other systems were reviewed with the patient and are negative.  PHYSICAL EXAMINATION: ECOG PERFORMANCE STATUS: 0 - Asymptomatic  Vitals:   06/30/21 1503  BP: 128/70  Pulse: 74  Resp: 18  Temp: (!) 97.5 F (36.4 C)  SpO2: 99%   Filed Weights   06/30/21 1503  Weight: 209 lb 9.6 oz (95.1 kg)    GENERAL:alert, no distress and comfortable SKIN: skin color, texture, turgor are normal, no rashes or significant lesions EYES: normal, conjunctiva are pink and non-injected, sclera clear OROPHARYNX:no exudate, normal lips, buccal mucosa, and tongue  NECK: supple, thyroid normal size, non-tender, without nodularity LYMPH:  no palpable lymphadenopathy in the cervical, axillary or inguinal LUNGS: clear to auscultation and percussion with normal breathing effort HEART: regular rate & rhythm and no murmurs without lower extremity edema ABDOMEN:abdomen soft, non-tender and normal bowel sounds Musculoskeletal:no cyanosis of digits and no clubbing  PSYCH: alert & oriented x 3 with fluent speech NEURO: no focal motor/sensory deficits  LABORATORY DATA:  I have reviewed the  data as listed Lab Results  Component Value Date   WBC 5.4 05/25/2021   HGB 10.9 (L) 05/25/2021   HCT 36.5 (L) 05/25/2021   MCV 90.1 05/25/2021   PLT 168 05/25/2021   Recent Labs    07/14/20 1319 11/06/20 1339 05/25/21 0943 05/25/21 1030  NA 138 141 137  --   K 4.7 4.6 4.6  --   CL 105 108 106  --   CO2 22 22 23   --   GLUCOSE 140* 106* 114*  --   BUN 30* 22 32*  --   CREATININE 1.53* 1.48* 1.46* 1.40*  CALCIUM 9.3 9.0 8.8*  --   GFRNONAA  --  48* 49*  --   PROT 8.0 8.1 7.8  --   ALBUMIN  --  3.7 3.6  --   AST 23 27 28   --   ALT 19 24 23   --   ALKPHOS  --  74 68  --   BILITOT 0.4 0.8 0.6  --     RADIOGRAPHIC STUDIES: I have personally reviewed the radiological images as listed and agreed with the findings in the report.   ASSESSMENT & PLAN:  Chromophobe renal cell carcinoma (HCC) Recent imaging show no evidence of disease He will continue close monitoring and follow-up  Abnormal finding on imaging of liver MRI of the abdomen only showed fatty liver changes We discussed the importance of dietary modification and weight loss  Iron deficiency anemia He has borderline iron deficiency He did not tolerate oral iron supplement well due to constipation I recommend he space out his oral iron supplement to every other day and we will recheck his blood work in 6 months  No orders of the defined types were placed in this encounter.   All questions were answered. The patient knows to call the clinic with any problems, questions or concerns. The total time spent in the appointment was 20 minutes encounter with patients including review of chart and various tests results, discussions about plan of care and coordination of care plan   Heath Lark, MD 07/01/2021 7:51 AM

## 2021-07-14 ENCOUNTER — Other Ambulatory Visit: Payer: Self-pay

## 2021-07-14 ENCOUNTER — Other Ambulatory Visit: Payer: Medicare Other

## 2021-07-14 DIAGNOSIS — C61 Malignant neoplasm of prostate: Secondary | ICD-10-CM

## 2021-07-14 DIAGNOSIS — C649 Malignant neoplasm of unspecified kidney, except renal pelvis: Secondary | ICD-10-CM

## 2021-07-15 LAB — PSA: Prostate Specific Ag, Serum: <0.1 ng/mL (ref 0.0–4.0)

## 2021-07-21 ENCOUNTER — Ambulatory Visit: Payer: Medicare Other | Admitting: Urology

## 2021-08-24 ENCOUNTER — Encounter: Payer: Self-pay | Admitting: Urology

## 2021-08-24 ENCOUNTER — Telehealth (INDEPENDENT_AMBULATORY_CARE_PROVIDER_SITE_OTHER): Payer: Medicare Other | Admitting: Urology

## 2021-08-24 ENCOUNTER — Other Ambulatory Visit: Payer: Self-pay

## 2021-08-24 DIAGNOSIS — N2889 Other specified disorders of kidney and ureter: Secondary | ICD-10-CM

## 2021-08-24 DIAGNOSIS — C61 Malignant neoplasm of prostate: Secondary | ICD-10-CM

## 2021-08-24 NOTE — Progress Notes (Signed)
08/24/2021 2:47 PM   Jon Brooks. [birth year] [id number]  Referring provider: Noreene Larsson, NP [address]  [address] [place],  [place] [address]  Patient location: home Physician location: office I connected with  R[place]. on 08/24/21 by a video enabled telemedicine application and verified that I am speaking with the correct person using two identifiers.   I discussed the limitations of evaluation and management by telemedicine. The patient expressed understanding and agreed to proceed.    Followup RCC and prostate cancer  HPI: Jon Brooks is a 80yo here for followup for prostate cancer and RCC. PSA remains undetectable. He has mild hot flashes. Good energy. MRI from 04/2021 shows a stable right renal AML.    PMH: Past Medical History:  Diagnosis Date   Anemia    Cataract    Diverticulosis of colon    Frequency of urination    Glaucoma, both eyes    Heart murmur    History of adenomatous polyp of colon 05/16/2013   History of transient ischemic attack (TIA) 06/19/2015   no residual   Hot flashes    Hyperlipidemia    Nocturia    Prostate cancer [facility]) urologist-  dr Alyson Ingles  oncologist -- dr Tammi Klippel   dx 11-08-20147 via bx--- Gleason 4+5,  PSA 14.1, vol 52cc/  08-16-2017 last PSA  1.3 (per dr Alyson Ingles office note)     Renal cell carcinoma, left [facility]) oncologist-  dr Barbaraann Share zhou ([facility])/  urologist-  dr Alyson Ingles   Stage III (pT3a, pN0)  Chromophobe RCC into perinephric fat /  01-20-2017 s/p  open radical left nephrecotmy/- per [facility] oncologist note -- no evidence of disease   Sarcoidosis    per pt year 1980--- last Chest CT 05-15-2017   Type 2 diabetes mellitus ([place])    Wears partial dentures    upper and lower    Surgical History: Past Surgical History:  Procedure Laterality Date   COLONOSCOPY N/A 05/16/2013   Procedure: COLONOSCOPY;  Surgeon: Daneil Dolin, MD;  [facility];  Service: Endoscopy;  Laterality: N/A;  9:30    cyst on left shoulder  yrs ago   CYSTOSCOPY N/A 10/10/2017   Procedure: CYSTOSCOPY FLEXIBLE;  Surgeon: Cleon Gustin, MD;  Location: [facility];  Service: Urology;  Laterality: N/A;  2 seeds removed from bladder per Dr Alyson Ingles   GOLD SEED IMPLANT N/A 10/10/2017   Procedure: GOLD SEED IMPLANT;  Surgeon: Cleon Gustin, MD;  [facility];  Service: Urology;  Laterality: N/A;  3 gold seeds implanted   MOLE REMOVAL     lower back by dr Romona Curls   NEPHRECTOMY Left 01/20/2017   Procedure: OPEN NEPHRECTOMY;  Surgeon: Cleon Gustin, MD;  [facility];  Service: Urology;  Laterality: Left;   PROSTATE SURGERY     RADIOACTIVE SEED IMPLANT N/A 10/10/2017   Procedure: RADIOACTIVE SEED IMPLANT/BRACHYTHERAPY IMPLANT;  Surgeon: Cleon Gustin, MD;  Location: [facility];  Service: Urology;  Laterality: N/A;  57 seeds implanted   SPACE OAR INSTILLATION N/A 10/10/2017   Procedure: SPACE OAR INSTILLATION;  Surgeon: Cleon Gustin, MD;  [facility];  Service: Urology;  Laterality: N/A;   TRANSTHORACIC ECHOCARDIOGRAM  06/20/2015   mild to moderate LVH, ef 60-65%/  trivial Jon    Home Medications:  Allergies as of 08/24/2021   No Known Allergies      Medication List  Accurate as of August 24, 2021  2:47 PM. If you have any questions, ask your nurse or doctor.          aspirin EC 81 MG tablet Take 81 mg by mouth every evening.   atorvastatin 80 MG tablet Commonly known as: LIPITOR Take 1 tablet (80 mg total) by mouth daily at 6 PM. What changed: when to take this   Azopt 1 % ophthalmic suspension Generic drug: brinzolamide Place 1 drop into both eyes 2 (two) times daily.   glipiZIDE 5 MG tablet Commonly known as: GLUCOTROL Take 1 tablet (5 mg total) by mouth 2 (two) times daily before a meal.   lisinopril 2.5 MG tablet Commonly known as: ZESTRIL Take 1 tablet (2.5 mg total)  by mouth daily. What changed: when to take this        Allergies: No Known Allergies  Family History: Family History  Problem Relation Age of Onset   Liver disease Father        NASH? age 52   Diabetes Father    Hypertension Mother    Colon cancer Neg Hx     Social History:  reports that he has never smoked. He has never used smokeless tobacco. He reports that he does not drink alcohol and does not use drugs.  ROS: All other review of systems were reviewed and are negative except what is noted above in HPI   Laboratory Data: Lab Results  Component Value Date   WBC 5.4 05/25/2021   HGB 10.9 (L) 05/25/2021   HCT 36.5 (L) 05/25/2021   MCV 90.1 05/25/2021   PLT 168 05/25/2021    Lab Results  Component Value Date   CREATININE 1.40 (H) 05/25/2021    Lab Results  Component Value Date   PSA <0.1 07/14/2020   PSA <0.1 01/08/2020   PSA 25.28 (H) 12/22/2016    No results found for: TESTOSTERONE  Lab Results  Component Value Date   HGBA1C 6.7 (A) 01/30/2020    Urinalysis    Component Value Date/Time   COLORURINE YELLOW 06/19/2015 1819   APPEARANCEUR Hazy (A) 01/20/2021 1545   LABSPEC 1.025 06/19/2015 1819   PHURINE 5.5 06/19/2015 1819   GLUCOSEU Negative 01/20/2021 1545   HGBUR TRACE (A) 06/19/2015 1819   BILIRUBINUR Negative 01/20/2021 1545   KETONESUR NEGATIVE 06/19/2015 1819   PROTEINUR Negative 01/20/2021 1545   PROTEINUR NEGATIVE 06/19/2015 1819   UROBILINOGEN 0.2 06/19/2015 1819   NITRITE Positive (A) 01/20/2021 1545   NITRITE POSITIVE (A) 06/19/2015 1819   LEUKOCYTESUR 1+ (A) 01/20/2021 1545    Lab Results  Component Value Date   LABMICR See below: 01/20/2021   WBCUA 11-30 (A) 01/20/2021   LABEPIT 0-10 01/20/2021   BACTERIA Many (A) 01/20/2021    Pertinent Imaging: MRI 04/2021: Images reviewed and discussed with the patient No results found for this or any previous visit.  No results found for this or any previous visit.  No results  found for this or any previous visit.  No results found for this or any previous visit.  No results found for this or any previous visit.  No results found for this or any previous visit.  No results found for this or any previous visit.  No results found for this or any previous visit.   Assessment & Plan:    1. Prostate cancer ([place]) -RTC 6 months with PSA  2. Renal mass -RTC 6 months with CT and CMP   No follow-ups on file.  Nicolette Bang, MD  [facility] [place]

## 2021-08-24 NOTE — Patient Instructions (Signed)
Prostate Cancer  The prostate is a male gland that helps make semen. It is located below a man's bladder, in front of the rectum. Prostate cancer is when abnormal cells grow inthis gland. What are the causes? The cause of this condition is not known. What increases the risk? You are more likely to develop this condition if: You are 78 years of age or older. You are African American. You have a family history of prostate cancer. You have a family history of breast cancer. What are the signs or symptoms? Symptoms of this condition include: A need to pee often. Peeing that is weak, or pee that stops and starts. Trouble starting or stopping your pee. Inability to pee. Blood in your pee or semen. Pain in the lower back, lower belly (abdomen), hips, or upper thighs. Trouble getting an erection. Trouble emptying all of your pee. How is this treated? Treatment for this condition depends on your age, your health, the kind of treatment you like, and how far the cancer has spread. Treatments include: Being watched. This is called observation. You will be tested from time to time, but you will not get treated. Tests are to make sure that the cancer is not growing. Surgery. This may be done to remove the prostate, to remove the testicles, or to freeze or kill cancer cells. Radiation. This uses a strong beam to kill cancer cells. Ultrasound energy. This uses strong sound waves to kill cancer cells. Chemotherapy. This uses medicines that stop cancer cells from increasing. This kills cancer cells and healthy cells. Targeted therapy. This kills cancer cells only. Healthy cells are not affected. Hormone treatment. This stops the body from making hormones that help the cancer cells to grow. Follow these instructions at home: Take over-the-counter and prescription medicines only as told by your doctor. Eat a healthy diet. Get plenty of sleep. Ask your doctor for help to find a support group for men  with prostate cancer. If you have to go to the hospital, let your cancer doctor (oncologist) know. Treatment may affect your ability to have sex. Touch, hold, hug, and caress your partner to have intimate moments. Keep all follow-up visits as told by your doctor. This is important. Contact a doctor if: You have new or more trouble peeing. You have new or more blood in your pee. You have new or more pain in your hips, back, or chest. Get help right away if: You have weakness in your legs. You lose feeling in your legs. You cannot control your pee or your poop (stool). You have chills or a fever. Summary The prostate is a male gland that helps make semen. Prostate cancer is when abnormal cells grow in this gland. Treatment includes doing surgery, using medicines, using very strong beams, or watching without treatment. Ask your doctor for help to find a support group for men with prostate cancer. Contact a doctor if you have problems peeing or have any new pain that you did not have before. This information is not intended to replace advice given to you by your health care provider. Make sure you discuss any questions you have with your healthcare provider. Document Revised: 01/14/2021 Document Reviewed: 11/26/2019 Elsevier Patient Education  2022 Elsevier Inc.  

## 2021-12-08 ENCOUNTER — Other Ambulatory Visit: Payer: Self-pay | Admitting: Urology

## 2021-12-30 ENCOUNTER — Inpatient Hospital Stay (HOSPITAL_COMMUNITY): Payer: Medicare Other | Attending: Hematology

## 2022-01-06 ENCOUNTER — Ambulatory Visit (HOSPITAL_COMMUNITY): Payer: Medicare Other | Admitting: Hematology

## 2022-02-16 ENCOUNTER — Other Ambulatory Visit: Payer: Medicare Other

## 2022-02-16 ENCOUNTER — Other Ambulatory Visit: Payer: Self-pay

## 2022-02-16 DIAGNOSIS — C61 Malignant neoplasm of prostate: Secondary | ICD-10-CM

## 2022-02-17 LAB — PSA: Prostate Specific Ag, Serum: <0.1 ng/mL (ref 0.0–4.0)

## 2022-02-23 ENCOUNTER — Ambulatory Visit (INDEPENDENT_AMBULATORY_CARE_PROVIDER_SITE_OTHER): Payer: Medicare Other | Admitting: Urology

## 2022-02-23 ENCOUNTER — Other Ambulatory Visit: Payer: Self-pay

## 2022-02-23 ENCOUNTER — Encounter: Payer: Self-pay | Admitting: Urology

## 2022-02-23 VITALS — BP 156/68 | HR 81 | Wt 209.0 lb

## 2022-02-23 DIAGNOSIS — C61 Malignant neoplasm of prostate: Secondary | ICD-10-CM

## 2022-02-23 DIAGNOSIS — N2889 Other specified disorders of kidney and ureter: Secondary | ICD-10-CM

## 2022-02-23 NOTE — Patient Instructions (Signed)
Prostate Cancer °The prostate is a small gland that helps make semen. It is located below a man's bladder, in front of the rectum. Prostate cancer is when abnormal cells grow in this gland. °What are the causes? °The cause of this condition is not known. °What increases the risk? °Being age 79 or older. °Having a family history of prostate cancer. °Having a family history of cancer of the breasts or ovaries. °Having genes that are passed from parent to child (inherited). °Having Lynch syndrome. °African American men and men of African descent are diagnosed with prostate cancer at higher rates than other men. °What are the signs or symptoms? °Problems peeing (urinating). This may include: °A stream that is weak, or pee that stops and starts. °Trouble starting or stopping your pee. °Trouble emptying all of your pee. °Needing to pee more often, especially at night. °Blood in your pee or semen. °Pain in the: °Lower back. °Lower belly (abdomen). °Hips. °Trouble getting an erection. °Weakness or numbness in the legs or feet. °How is this treated? °Treatment for this condition depends on: °How much the cancer has spread. °Your age. °The kind of treatment you want. °Your health. °Treatments include: °Being watched. This is called observation. You will be tested from time to time, but you will not get treated. Tests are to make sure that the cancer is not growing. °Surgery. This may be done to: °Take out (remove) the prostate. °Freeze and kill cancer cells. °Radiation. This uses a strong beam of energy to kill cancer cells. °Chemotherapy. This uses medicines that stop cancer cells from increasing. This kills cancer cells and healthy cells. °Targeted therapy. This kills cancer cells only. Healthy cells are not affected. °Hormone treatment. This stops the body from making hormones that help the cancer cells grow. °Follow these instructions at home: °Lifestyle °Do not smoke or use any products that contain nicotine or tobacco.  If you need help quitting, ask your doctor. °Eat a healthy diet. °Treatment may affect your ability to have sex. If you have a partner, touch, hold, hug, and caress your partner to have intimate moments. °Get plenty of sleep. °Ask your doctor for help to find a support group for men with prostate cancer. °General instructions °Take over-the-counter and prescription medicines only as told by your doctor. °If you have to go to the hospital, let your cancer doctor (oncologist) know. °Keep all follow-up visits. °Where to find more information °American Cancer Society: www.cancer.org °American Society of Clinical Oncology: www.cancer.net °National Cancer Institute: www.cancer.gov °Contact a doctor if: °You have new or more trouble peeing. °You have new or more blood in your pee. °You have new or more pain in your hips, back, or chest. °Get help right away if: °You have weakness in your legs. °You lose feeling in your legs. °You cannot control your pee or your poop (stool). °You have chills or a fever. °Summary °The prostate is a male gland that helps make semen. °Prostate cancer is when abnormal cells grow in this gland. °Treatment includes doing surgery, using medicines, using strong beams of energy, or watching without treatment. °Ask your doctor for help to find a support group for men with prostate cancer. °Contact a doctor if you have problems peeing or have any new pain that you did not have before. °This information is not intended to replace advice given to you by your health care provider. Make sure you discuss any questions you have with your health care provider. °Document Revised: 03/10/2021 Document Reviewed: 03/10/2021 °Elsevier   Patient Education © 2022 Elsevier Inc. ° °

## 2022-02-23 NOTE — Progress Notes (Signed)
? ?02/23/2022 ?3:47 PM  ? ?Jon Brooks. ?Jun 22, 1941 ?[id number] ? ?Referring provider: Noreene Larsson, NP ?No address on file ? ?Followuop prostate cancer ? ? ?HPI: ?Jon Brooks is a 81yo here for followup for prostate cancer. PSA remains undetectable. No significant LUTS. He passed a couple of seeds 3 months ago. No dysuria or gross hematuria. Mild hot flashes, good energy. No other complaints today.  ? ? ?PMH: ?Past Medical History:  ?Diagnosis Date  ? Anemia   ? Cataract   ? Diverticulosis of colon   ? Frequency of urination   ? Glaucoma, both eyes   ? Heart murmur   ? History of adenomatous polyp of colon 05/16/2013  ? History of transient ischemic attack (TIA) 06/19/2015  ? no residual  ? Hot flashes   ? Hyperlipidemia   ? Nocturia   ? Prostate cancer [facility]) urologist-  dr Alyson Ingles  oncologist -- dr Tammi Klippel  ? dx [id number] via bx--- Gleason 4+5,  PSA 14.1, vol 52cc/  08-16-2017 last PSA  1.3 (per dr Alyson Ingles office note)    ? Renal cell carcinoma, left [facility]) oncologist-  dr Barbaraann Share zhou ([facility])/  urologist-  dr Alyson Ingles  ? Stage III (pT3a, pN0)  Chromophobe RCC into perinephric fat /  01-20-2017 s/p  open radical left nephrecotmy/- per [facility] oncologist note -- no evidence of disease  ? Sarcoidosis   ? per pt year 1980--- last Chest CT 05-15-2017  ? Type 2 diabetes mellitus ([place])   ? Wears partial dentures   ? upper and lower  ? ? ?Surgical History: ?Past Surgical History:  ?Procedure Laterality Date  ? COLONOSCOPY N/A 05/16/2013  ? Procedure: COLONOSCOPY;  Surgeon: Daneil Dolin, MD;  [facility];  Service: Endoscopy;  Laterality: N/A;  9:30  ? cyst on left shoulder  yrs ago  ? CYSTOSCOPY N/A 10/10/2017  ? Procedure: CYSTOSCOPY FLEXIBLE;  Surgeon: Cleon Gustin, MD;  [facility];  Service: Urology;  Laterality: N/A;  2 seeds removed from bladder per Dr Alyson Ingles  ? GOLD SEED IMPLANT N/A 10/10/2017  ? Procedure: GOLD SEED IMPLANT;  Surgeon: Cleon Gustin, MD;  Location: [facility];  Service: Urology;  Laterality: N/A;  3 gold seeds implanted  ? MOLE REMOVAL    ? lower back by dr bradford  ? NEPHRECTOMY Left 01/20/2017  ? Procedure: OPEN NEPHRECTOMY;  Surgeon: Cleon Gustin, MD;  [facility];  Service: Urology;  Laterality: Left;  ? PROSTATE SURGERY    ? RADIOACTIVE SEED IMPLANT N/A 10/10/2017  ? Procedure: RADIOACTIVE SEED IMPLANT/BRACHYTHERAPY IMPLANT;  Surgeon: Cleon Gustin, MD;  Location: [facility];  Service: Urology;  Laterality: N/A;  57 seeds implanted  ? SPACE OAR INSTILLATION N/A 10/10/2017  ? Procedure: SPACE OAR INSTILLATION;  Surgeon: Cleon Gustin, MD;  Location: [facility];  Service: Urology;  Laterality: N/A;  ? TRANSTHORACIC ECHOCARDIOGRAM  06/20/2015  ? mild to moderate LVH, ef 60-65%/  trivial Jon  ? ? ?Home Medications:  ?Allergies as of 02/23/2022   ?No Known Allergies ?  ? ?  ?Medication List  ?  ? ?  ? Accurate as of February 23, 2022  3:47 PM. If you have any questions, ask your nurse or doctor.  ?  ?  ? ?  ? ?aspirin EC 81 MG tablet ?Take 81 mg by mouth every evening. ?  ?atorvastatin 80 MG tablet ?Commonly known as: LIPITOR ?Take  1 tablet (80 mg total) by mouth daily at 6 PM. ?What changed: when to take this ?  ?Azopt 1 % ophthalmic suspension ?Generic drug: brinzolamide ?Place 1 drop into both eyes 2 (two) times daily. ?  ?glipiZIDE 5 MG tablet ?Commonly known as: GLUCOTROL ?Take 1 tablet (5 mg total) by mouth 2 (two) times daily before a meal. ?  ?latanoprost 0.005 % ophthalmic solution ?Commonly known as: XALATAN ?SMARTSIG:1 Drop(s) In Eye(s) Every Evening ?  ?lisinopril 2.5 MG tablet ?Commonly known as: ZESTRIL ?Take 1 tablet (2.5 mg total) by mouth daily. ?What changed: when to take this ?  ? ?  ? ? ?Allergies: No Known Allergies ? ?Family History: ?Family History  ?Problem Relation Age of Onset  ? Liver disease Father   ?     NASH? age 27  ? Diabetes Father   ?  Hypertension Mother   ? Colon cancer Neg Hx   ? ? ?Social History:  reports that he has never smoked. He has never used smokeless tobacco. He reports that he does not drink alcohol and does not use drugs. ? ?ROS: ?All other review of systems were reviewed and are negative except what is noted above in HPI ? ?Physical Exam: ?BP (!) 156/68   Pulse 81   Wt 209 lb (94.8 kg)   BMI 32.73 kg/m?   ?Constitutional:  Alert and oriented, No acute distress. ?HEENT: [place] AT, moist mucus membranes.  Trachea midline, no masses. ?Cardiovascular: No clubbing, cyanosis, or edema. ?Respiratory: Normal respiratory effort, no increased work of breathing. ?GI: Abdomen is soft, nontender, nondistended, no abdominal masses ?GU: No CVA tenderness.  ?Lymph: No cervical or inguinal lymphadenopathy. ?Skin: No rashes, bruises or suspicious lesions. ?Neurologic: Grossly intact, no focal deficits, moving all 4 extremities. ?Psychiatric: Normal mood and affect. ? ?Laboratory Data: ?Lab Results  ?Component Value Date  ? WBC 5.4 05/25/2021  ? HGB 10.9 (L) 05/25/2021  ? HCT 36.5 (L) 05/25/2021  ? MCV 90.1 05/25/2021  ? PLT 168 05/25/2021  ? ? ?Lab Results  ?Component Value Date  ? CREATININE 1.40 (H) 05/25/2021  ? ? ?Lab Results  ?Component Value Date  ? PSA <0.1 07/14/2020  ? PSA <0.1 01/08/2020  ? PSA 25.28 (H) 12/22/2016  ? ? ?No results found for: TESTOSTERONE ? ?Lab Results  ?Component Value Date  ? HGBA1C 6.7 (A) 01/30/2020  ? ? ?Urinalysis ?   ?Component Value Date/Time  ? [place] YELLOW 06/19/2015 1819  ? APPEARANCEUR Hazy (A) 01/20/2021 1545  ? LABSPEC 1.025 06/19/2015 1819  ? PHURINE 5.5 06/19/2015 1819  ? GLUCOSEU Negative 01/20/2021 1545  ? HGBUR TRACE (A) 06/19/2015 1819  ? BILIRUBINUR Negative 01/20/2021 1545  ? [place] NEGATIVE 06/19/2015 1819  ? PROTEINUR Negative 01/20/2021 1545  ? [place] NEGATIVE 06/19/2015 1819  ? UROBILINOGEN 0.2 06/19/2015 1819  ? NITRITE Positive (A) 01/20/2021 1545  ? NITRITE POSITIVE (A) 06/19/2015  1819  ? LEUKOCYTESUR 1+ (A) 01/20/2021 1545  ? ? ?Lab Results  ?Component Value Date  ? LABMICR See below: 01/20/2021  ? WBCUA 11-30 (A) 01/20/2021  ? LABEPIT 0-10 01/20/2021  ? BACTERIA Many (A) 01/20/2021  ? ? ?Pertinent Imaging: ? ?No results found for this or any previous visit. ? ?No results found for this or any previous visit. ? ?No results found for this or any previous visit. ? ?No results found for this or any previous visit. ? ?No results found for this or any previous visit. ? ?No results found for this or  any previous visit. ? ?No results found for this or any previous visit. ? ?No results found for this or any previous visit. ? ? ?Assessment & Plan:   ? ?1. Prostate cancer ([place]) ?-RTC 1 year with PSA ?- Ultrasound renal complete; Future ?- PSA; Future ? ?2. Renal mass ?-RTC 1 year with renal US ? ? ?No follow-ups on file. ? ?Nicolette Bang, MD ? ?[facility] [place] ?  ?

## 2023-02-21 ENCOUNTER — Ambulatory Visit (HOSPITAL_COMMUNITY): Admission: RE | Admit: 2023-02-21 | Payer: Medicare Other | Source: Ambulatory Visit

## 2023-02-22 NOTE — Progress Notes (Signed)
Encounter created in error

## 2023-02-28 ENCOUNTER — Other Ambulatory Visit: Payer: Self-pay

## 2023-02-28 DIAGNOSIS — N2889 Other specified disorders of kidney and ureter: Secondary | ICD-10-CM

## 2023-03-01 ENCOUNTER — Ambulatory Visit: Payer: Medicare Other | Admitting: Urology

## 2023-03-30 ENCOUNTER — Ambulatory Visit (HOSPITAL_COMMUNITY)
Admission: RE | Admit: 2023-03-30 | Discharge: 2023-03-30 | Disposition: A | Discharge status: Home / Self Care | Payer: Medicare Other | Source: Ambulatory Visit | Attending: Urology | Admitting: Urology

## 2023-03-30 DIAGNOSIS — N2889 Other specified disorders of kidney and ureter: Secondary | ICD-10-CM | POA: Insufficient documentation

## 2023-04-18 ENCOUNTER — Ambulatory Visit (INDEPENDENT_AMBULATORY_CARE_PROVIDER_SITE_OTHER): Payer: Medicare Other | Admitting: Urology

## 2023-04-18 ENCOUNTER — Encounter: Payer: Self-pay | Admitting: Urology

## 2023-04-18 VITALS — BP 127/72 | HR 88

## 2023-04-18 DIAGNOSIS — C61 Malignant neoplasm of prostate: Secondary | ICD-10-CM

## 2023-04-18 DIAGNOSIS — N2889 Other specified disorders of kidney and ureter: Secondary | ICD-10-CM | POA: Diagnosis not present

## 2023-04-18 NOTE — Progress Notes (Signed)
04/18/2023 1:24 PM   Jon Brooks. 25-Mar-1941 [id number]  Referring provider: Heather Roberts, NP [address] [place],  [place] [address]  Followup prostate cancer and RCC   HPI: Jon Brooks is a 82yo here for followup for prostate cancer and Left RCC. 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 No recent PSa or CMP. He denies any significant LUTS. No other complaints today   PMH: Past Medical History:  Diagnosis Date   Anemia    Cataract    Diverticulosis of colon    Frequency of urination    Glaucoma, both eyes    Heart murmur    History of adenomatous polyp of colon 05/16/2013   History of transient ischemic attack (TIA) 06/19/2015   no residual   Hot flashes    Hyperlipidemia    Nocturia    Prostate cancer [facility]) urologist-  dr Ronne Binning  oncologist -- dr Kathrynn Running   dx 11-08-20147 via bx--- Gleason 4+5,  PSA 14.1, vol 52cc/  08-16-2017 last PSA  1.3 (per dr Ronne Binning office note)     Renal cell carcinoma, left [facility]) oncologist-  dr Sallye Ober zhou ([facility])/  urologist-  dr Ronne Binning   Stage III (pT3a, pN0)  Chromophobe RCC into perinephric fat /  01-20-2017 s/p  open radical left nephrecotmy/- per [facility] oncologist note -- no evidence of disease   Sarcoidosis    per pt year 1980--- last Chest CT 05-15-2017   Type 2 diabetes mellitus ([facility])    Wears partial dentures    upper and lower    Surgical History: Past Surgical History:  Procedure Laterality Date   COLONOSCOPY N/A 05/16/2013   Procedure: COLONOSCOPY;  Surgeon: Corbin Ade, MD;  [facility];  Service: Endoscopy;  Laterality: N/A;  9:30   cyst on left shoulder  yrs ago   CYSTOSCOPY N/A 10/10/2017   Procedure: CYSTOSCOPY FLEXIBLE;  Surgeon: Malen Gauze, MD;  Location: [facility];  Service: Urology;  Laterality: N/A;  2 seeds removed from bladder per Dr Ronne Binning   GOLD SEED IMPLANT N/A 10/10/2017   Procedure: GOLD SEED IMPLANT;  Surgeon: Malen Gauze, MD;  Location:  [facility];  Service: Urology;  Laterality: N/A;  3 gold seeds implanted   MOLE REMOVAL     lower back by dr Malvin Johns   NEPHRECTOMY Left 01/20/2017   Procedure: OPEN NEPHRECTOMY;  Surgeon: Malen Gauze, MD;  [facility];  Service: Urology;  Laterality: Left;   PROSTATE SURGERY     RADIOACTIVE SEED IMPLANT N/A 10/10/2017   Procedure: RADIOACTIVE SEED IMPLANT/BRACHYTHERAPY IMPLANT;  Surgeon: Malen Gauze, MD;  Location: [facility];  Service: Urology;  Laterality: N/A;  57 seeds implanted   SPACE OAR INSTILLATION N/A 10/10/2017   Procedure: SPACE OAR INSTILLATION;  Surgeon: Malen Gauze, MD;  Location: [facility];  Service: Urology;  Laterality: N/A;   TRANSTHORACIC ECHOCARDIOGRAM  06/20/2015   mild to moderate LVH, ef 60-65%/  trivial Jon    Home Medications:  Allergies as of 04/18/2023   No Known Allergies      Medication List        Accurate as of April 18, 2023  1:24 PM. If you have any questions, ask your nurse or doctor.          aspirin EC 81 MG tablet Take 81 mg by mouth every evening.   atorvastatin 80 MG tablet Commonly known as: LIPITOR Take 1  tablet (80 mg total) by mouth daily at 6 PM. What changed: when to take this   Azopt 1 % ophthalmic suspension Generic drug: brinzolamide Place 1 drop into both eyes 2 (two) times daily.   glipiZIDE 5 MG tablet Commonly known as: GLUCOTROL Take 1 tablet (5 mg total) by mouth 2 (two) times daily before a meal.   latanoprost 0.005 % ophthalmic solution Commonly known as: XALATAN SMARTSIG:1 Drop(s) In Eye(s) Every Evening   lisinopril 2.5 MG tablet Commonly known as: ZESTRIL Take 1 tablet (2.5 mg total) by mouth daily. What changed: when to take this        Allergies: No Known Allergies  Family History: Family History  Problem Relation Age of Onset   Liver disease Father        NASH? age 21   Diabetes Father    Hypertension Mother     Colon cancer Neg Hx     Social History:  reports that he has never smoked. He has never used smokeless tobacco. He reports that he does not drink alcohol and does not use drugs.  ROS: All other review of systems were reviewed and are negative except what is noted above in HPI  Physical Exam: BP 127/72   Pulse 88   Constitutional:  Alert and oriented, No acute distress. HEENT: [place] AT, moist mucus membranes.  Trachea midline, no masses. Cardiovascular: No clubbing, cyanosis, or edema. Respiratory: Normal respiratory effort, no increased work of breathing. GI: Abdomen is soft, nontender, nondistended, no abdominal masses GU: No CVA tenderness.  Lymph: No cervical or inguinal lymphadenopathy. Skin: No rashes, bruises or suspicious lesions. Neurologic: Grossly intact, no focal deficits, moving all 4 extremities. Psychiatric: Normal mood and affect.  Laboratory Data: Lab Results  Component Value Date   WBC 5.4 05/25/2021   HGB 10.9 (L) 05/25/2021   HCT 36.5 (L) 05/25/2021   MCV 90.1 05/25/2021   PLT 168 05/25/2021    Lab Results  Component Value Date   CREATININE 1.40 (H) 05/25/2021    Lab Results  Component Value Date   PSA <0.1 07/14/2020   PSA <0.1 01/08/2020   PSA 25.28 (H) 12/22/2016    No results found for: "TESTOSTERONE"  Lab Results  Component Value Date   HGBA1C 6.7 (A) 01/30/2020    Urinalysis    Component Value Date/Time   COLORURINE YELLOW 06/19/2015 1819   APPEARANCEUR Hazy (A) 01/20/2021 1545   LABSPEC 1.025 06/19/2015 1819   PHURINE 5.5 06/19/2015 1819   GLUCOSEU Negative 01/20/2021 1545   HGBUR TRACE (A) 06/19/2015 1819   BILIRUBINUR Negative 01/20/2021 1545   KETONESUR NEGATIVE 06/19/2015 1819   PROTEINUR Negative 01/20/2021 1545   PROTEINUR NEGATIVE 06/19/2015 1819   UROBILINOGEN 0.2 06/19/2015 1819   NITRITE Positive (A) 01/20/2021 1545   NITRITE POSITIVE (A) 06/19/2015 1819   LEUKOCYTESUR 1+ (A) 01/20/2021 1545    Lab Results   Component Value Date   LABMICR See below: 01/20/2021   WBCUA 11-30 (A) 01/20/2021   LABEPIT 0-10 01/20/2021   BACTERIA Many (A) 01/20/2021    Pertinent Imaging: Renal US 03/30/2023: Images reviewed and discussed with the patient  No results found for this or any previous visit.  No results found for this or any previous visit.  No results found for this or any previous visit.  No results found for this or any previous visit.  Results for orders placed in visit on 02/28/23  US RENAL  Narrative CLINICAL DATA:  Renal mass  EXAM:  RENAL / URINARY TRACT ULTRASOUND COMPLETE  COMPARISON:  MRI abdomen dated May 25, 2021  FINDINGS: Right Kidney:  Renal measurements: 11.9 x 6.5 x 6.0 cm = volume: 241.7 mL. Echogenicity within normal limits. No hydronephrosis visualized. Exophytic hyperechoic solid mass of the lower pole of the right kidney measuring 5.0 x 3.5 x 4.4 cm. Simple appearing cyst of the mid region of the right kidney measuring 3.0 x 2.6 x 2.5 cm.  Left Kidney:  Surgically absent.  Bladder:  Appears normal for degree of bladder distention.  Other:  None.  IMPRESSION: 1. 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 2. Simple appearing cyst of the mid region of the right kidney. 3. Surgically absent left kidney.   Electronically Signed By: Allegra Lai M.D. On: 04/01/2023 23:38  No valid procedures specified. No results found for this or any previous visit.  No results found for this or any previous visit.   Assessment & Plan:    1. Renal mass -followup 1 year with renal US - Urinalysis, Routine w reflex microscopic  2. Prostate cancer -followup 1 year with PSA   No follow-ups on file.  Wilkie Aye, MD  [facility] [place]

## 2023-04-18 NOTE — Patient Instructions (Signed)

## 2024-04-03 NOTE — Addendum Note (Signed)
 Addended by: Sarajane Jews on: 04/03/2024 02:54 PM   Modules accepted: Orders

## 2024-04-10 ENCOUNTER — Other Ambulatory Visit: Payer: Medicare Other

## 2024-04-17 ENCOUNTER — Ambulatory Visit: Payer: Medicare Other | Admitting: Urology

## 2024-07-05 ENCOUNTER — Other Ambulatory Visit

## 2024-07-12 ENCOUNTER — Ambulatory Visit: Admitting: Urology

## 2024-07-12 DIAGNOSIS — C61 Malignant neoplasm of prostate: Secondary | ICD-10-CM
